# Patient Record
Sex: Male | Born: 1945 | Race: White | Hispanic: No | Smoking: Former smoker
Health system: Southern US, Community
[De-identification: ages and names within clinical notes are randomized; demographics above are authoritative.]

## PROBLEM LIST (undated history)

## (undated) DIAGNOSIS — K219 Gastro-esophageal reflux disease without esophagitis: Secondary | ICD-10-CM

## (undated) DIAGNOSIS — E119 Type 2 diabetes mellitus without complications: Secondary | ICD-10-CM

## (undated) DIAGNOSIS — K7581 Nonalcoholic steatohepatitis (NASH): Secondary | ICD-10-CM

## (undated) DIAGNOSIS — E039 Hypothyroidism, unspecified: Secondary | ICD-10-CM

## (undated) DIAGNOSIS — E785 Hyperlipidemia, unspecified: Secondary | ICD-10-CM

## (undated) DIAGNOSIS — I10 Essential (primary) hypertension: Secondary | ICD-10-CM

## (undated) HISTORY — PX: HERNIA REPAIR: SHX51

## (undated) HISTORY — PX: APPENDECTOMY: SHX54

---

## 2021-04-01 ENCOUNTER — Emergency Department: Payer: Medicare Other

## 2021-04-01 ENCOUNTER — Encounter: Payer: Self-pay | Admitting: Internal Medicine

## 2021-04-01 ENCOUNTER — Inpatient Hospital Stay
Admission: EM | Admit: 2021-04-01 | Discharge: 2021-04-10 | DRG: 178 | Disposition: A | Discharge status: Home Health | Payer: Medicare Other | Attending: Internal Medicine | Admitting: Internal Medicine

## 2021-04-01 ENCOUNTER — Other Ambulatory Visit: Payer: Self-pay

## 2021-04-01 ENCOUNTER — Inpatient Hospital Stay: Payer: Medicare Other

## 2021-04-01 DIAGNOSIS — Z833 Family history of diabetes mellitus: Secondary | ICD-10-CM | POA: Diagnosis not present

## 2021-04-01 DIAGNOSIS — Z87891 Personal history of nicotine dependence: Secondary | ICD-10-CM | POA: Diagnosis not present

## 2021-04-01 DIAGNOSIS — K7460 Unspecified cirrhosis of liver: Secondary | ICD-10-CM | POA: Diagnosis not present

## 2021-04-01 DIAGNOSIS — I10 Essential (primary) hypertension: Secondary | ICD-10-CM | POA: Insufficient documentation

## 2021-04-01 DIAGNOSIS — I959 Hypotension, unspecified: Secondary | ICD-10-CM | POA: Diagnosis not present

## 2021-04-01 DIAGNOSIS — R531 Weakness: Secondary | ICD-10-CM

## 2021-04-01 DIAGNOSIS — N179 Acute kidney failure, unspecified: Secondary | ICD-10-CM | POA: Diagnosis present

## 2021-04-01 DIAGNOSIS — M25562 Pain in left knee: Secondary | ICD-10-CM

## 2021-04-01 DIAGNOSIS — E871 Hypo-osmolality and hyponatremia: Secondary | ICD-10-CM | POA: Diagnosis present

## 2021-04-01 DIAGNOSIS — T796XXD Traumatic ischemia of muscle, subsequent encounter: Secondary | ICD-10-CM | POA: Diagnosis not present

## 2021-04-01 DIAGNOSIS — D696 Thrombocytopenia, unspecified: Secondary | ICD-10-CM

## 2021-04-01 DIAGNOSIS — G9349 Other encephalopathy: Secondary | ICD-10-CM | POA: Diagnosis present

## 2021-04-01 DIAGNOSIS — W010XXA Fall on same level from slipping, tripping and stumbling without subsequent striking against object, initial encounter: Secondary | ICD-10-CM | POA: Diagnosis not present

## 2021-04-01 DIAGNOSIS — Y92009 Unspecified place in unspecified non-institutional (private) residence as the place of occurrence of the external cause: Secondary | ICD-10-CM

## 2021-04-01 DIAGNOSIS — S0083XA Contusion of other part of head, initial encounter: Secondary | ICD-10-CM | POA: Diagnosis present

## 2021-04-01 DIAGNOSIS — Z7984 Long term (current) use of oral hypoglycemic drugs: Secondary | ICD-10-CM

## 2021-04-01 DIAGNOSIS — Y92238 Other place in hospital as the place of occurrence of the external cause: Secondary | ICD-10-CM | POA: Diagnosis present

## 2021-04-01 DIAGNOSIS — Z8249 Family history of ischemic heart disease and other diseases of the circulatory system: Secondary | ICD-10-CM | POA: Diagnosis not present

## 2021-04-01 DIAGNOSIS — Z79899 Other long term (current) drug therapy: Secondary | ICD-10-CM | POA: Diagnosis not present

## 2021-04-01 DIAGNOSIS — K7581 Nonalcoholic steatohepatitis (NASH): Secondary | ICD-10-CM | POA: Insufficient documentation

## 2021-04-01 DIAGNOSIS — W19XXXA Unspecified fall, initial encounter: Secondary | ICD-10-CM | POA: Diagnosis not present

## 2021-04-01 DIAGNOSIS — E039 Hypothyroidism, unspecified: Secondary | ICD-10-CM | POA: Insufficient documentation

## 2021-04-01 DIAGNOSIS — D6959 Other secondary thrombocytopenia: Secondary | ICD-10-CM | POA: Diagnosis present

## 2021-04-01 DIAGNOSIS — S0001XA Abrasion of scalp, initial encounter: Secondary | ICD-10-CM | POA: Diagnosis present

## 2021-04-01 DIAGNOSIS — T796XXS Traumatic ischemia of muscle, sequela: Secondary | ICD-10-CM | POA: Diagnosis not present

## 2021-04-01 DIAGNOSIS — E785 Hyperlipidemia, unspecified: Secondary | ICD-10-CM | POA: Diagnosis not present

## 2021-04-01 DIAGNOSIS — K219 Gastro-esophageal reflux disease without esophagitis: Secondary | ICD-10-CM | POA: Diagnosis present

## 2021-04-01 DIAGNOSIS — R4182 Altered mental status, unspecified: Secondary | ICD-10-CM

## 2021-04-01 DIAGNOSIS — D649 Anemia, unspecified: Secondary | ICD-10-CM | POA: Diagnosis present

## 2021-04-01 DIAGNOSIS — W19XXXS Unspecified fall, sequela: Secondary | ICD-10-CM | POA: Diagnosis not present

## 2021-04-01 DIAGNOSIS — E1169 Type 2 diabetes mellitus with other specified complication: Secondary | ICD-10-CM | POA: Diagnosis present

## 2021-04-01 DIAGNOSIS — T796XXA Traumatic ischemia of muscle, initial encounter: Secondary | ICD-10-CM | POA: Diagnosis not present

## 2021-04-01 DIAGNOSIS — N1831 Chronic kidney disease, stage 3a: Secondary | ICD-10-CM | POA: Diagnosis present

## 2021-04-01 DIAGNOSIS — M6282 Rhabdomyolysis: Secondary | ICD-10-CM | POA: Diagnosis present

## 2021-04-01 DIAGNOSIS — E1122 Type 2 diabetes mellitus with diabetic chronic kidney disease: Secondary | ICD-10-CM | POA: Diagnosis present

## 2021-04-01 DIAGNOSIS — U071 COVID-19: Principal | ICD-10-CM | POA: Diagnosis present

## 2021-04-01 DIAGNOSIS — Z794 Long term (current) use of insulin: Secondary | ICD-10-CM | POA: Diagnosis not present

## 2021-04-01 DIAGNOSIS — E876 Hypokalemia: Secondary | ICD-10-CM | POA: Diagnosis not present

## 2021-04-01 DIAGNOSIS — I129 Hypertensive chronic kidney disease with stage 1 through stage 4 chronic kidney disease, or unspecified chronic kidney disease: Secondary | ICD-10-CM | POA: Diagnosis present

## 2021-04-01 DIAGNOSIS — Z23 Encounter for immunization: Secondary | ICD-10-CM

## 2021-04-01 DIAGNOSIS — R52 Pain, unspecified: Secondary | ICD-10-CM

## 2021-04-01 DIAGNOSIS — Z79890 Hormone replacement therapy (postmenopausal): Secondary | ICD-10-CM

## 2021-04-01 HISTORY — DX: Essential (primary) hypertension: I10

## 2021-04-01 HISTORY — DX: Nonalcoholic steatohepatitis (NASH): K75.81

## 2021-04-01 HISTORY — DX: Type 2 diabetes mellitus without complications: E11.9

## 2021-04-01 HISTORY — DX: Hypothyroidism, unspecified: E03.9

## 2021-04-01 HISTORY — DX: Hyperlipidemia, unspecified: E78.5

## 2021-04-01 HISTORY — DX: Gastro-esophageal reflux disease without esophagitis: K21.9

## 2021-04-01 LAB — HEPATIC FUNCTION PANEL
ALT: 38 U/L (ref 0–44)
AST: 93 U/L — ABNORMAL HIGH (ref 15–41)
Albumin: 3 g/dL — ABNORMAL LOW (ref 3.5–5.0)
Alkaline Phosphatase: 69 U/L (ref 38–126)
Bilirubin, Direct: 0.3 mg/dL — ABNORMAL HIGH (ref 0.0–0.2)
Indirect Bilirubin: 1.5 mg/dL — ABNORMAL HIGH (ref 0.3–0.9)
Total Bilirubin: 1.8 mg/dL — ABNORMAL HIGH (ref 0.3–1.2)
Total Protein: 6.8 g/dL (ref 6.5–8.1)

## 2021-04-01 LAB — RESP PANEL BY RT-PCR (FLU A&B, COVID) ARPGX2
Influenza A by PCR: NEGATIVE
Influenza B by PCR: NEGATIVE
SARS Coronavirus 2 by RT PCR: POSITIVE — AB

## 2021-04-01 LAB — CK: Total CK: 3206 U/L — ABNORMAL HIGH (ref 49–397)

## 2021-04-01 LAB — ETHANOL: Alcohol, Ethyl (B): <10 mg/dL (ref <10)

## 2021-04-01 LAB — CBC
HCT: 35.3 % — ABNORMAL LOW (ref 39.0–52.0)
Hemoglobin: 12.1 g/dL — ABNORMAL LOW (ref 13.0–17.0)
MCH: 30.9 pg (ref 26.0–34.0)
MCHC: 34.3 g/dL (ref 30.0–36.0)
MCV: 90.1 fL (ref 80.0–100.0)
Platelets: 90 10*3/uL — ABNORMAL LOW (ref 150–400)
RBC: 3.92 MIL/uL — ABNORMAL LOW (ref 4.22–5.81)
RDW: 14.9 % (ref 11.5–15.5)
WBC: 7.5 10*3/uL (ref 4.0–10.5)
nRBC: 0 % (ref 0.0–0.2)

## 2021-04-01 LAB — LIPASE, BLOOD: Lipase: 29 U/L (ref 11–51)

## 2021-04-01 LAB — BASIC METABOLIC PANEL
Anion gap: 9 (ref 5–15)
BUN: 36 mg/dL — ABNORMAL HIGH (ref 8–23)
CO2: 20 mmol/L — ABNORMAL LOW (ref 22–32)
Calcium: 8.7 mg/dL — ABNORMAL LOW (ref 8.9–10.3)
Chloride: 104 mmol/L (ref 98–111)
Creatinine, Ser: 1.35 mg/dL — ABNORMAL HIGH (ref 0.61–1.24)
GFR, Estimated: 54 mL/min — ABNORMAL LOW (ref >60)
Glucose, Bld: 210 mg/dL — ABNORMAL HIGH (ref 70–99)
Potassium: 3.5 mmol/L (ref 3.5–5.1)
Sodium: 133 mmol/L — ABNORMAL LOW (ref 135–145)

## 2021-04-01 IMAGING — CT CT MAXILLOFACIAL W/O CM
3 series · 15 of 47 positions shown, 18 images · non-contrast
Comparison: None.

CLINICAL DATA: Trauma

EXAM:
CT MAXILLOFACIAL WITHOUT CONTRAST
TECHNIQUE: Multidetector CT imaging of the maxillofacial structures was
performed. Multiplanar CT image reconstructions were also generated.

[Series 2: max soft · axial · 0.37mm/px · z∈[-252,-104]mm · 9 of 86 slices shown, 12 images]
[im 6/86  brain]
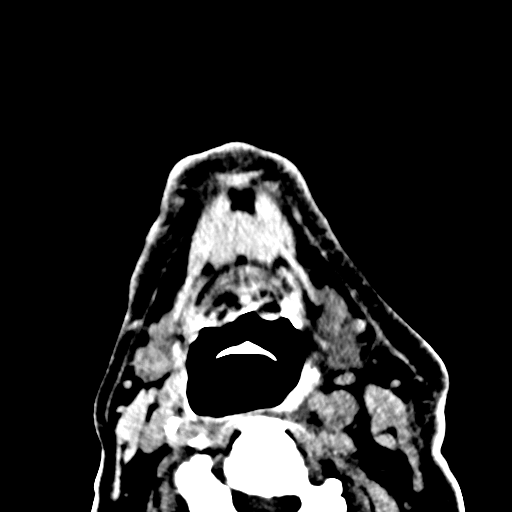
[im 6/86  bone]
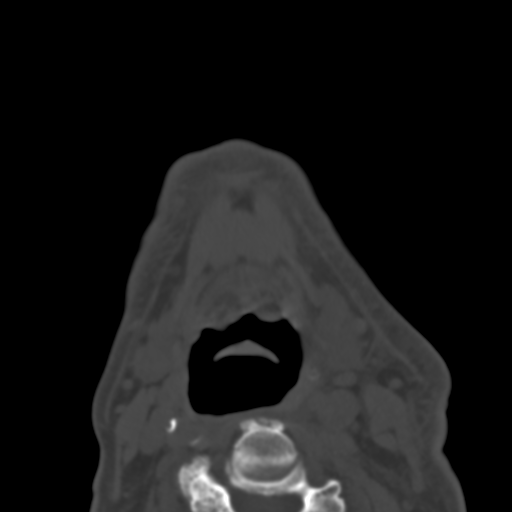
[im 15/86  bone]
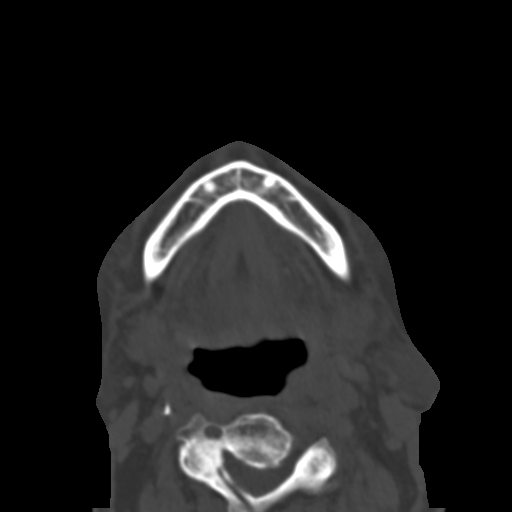
[im 24/86  bone]
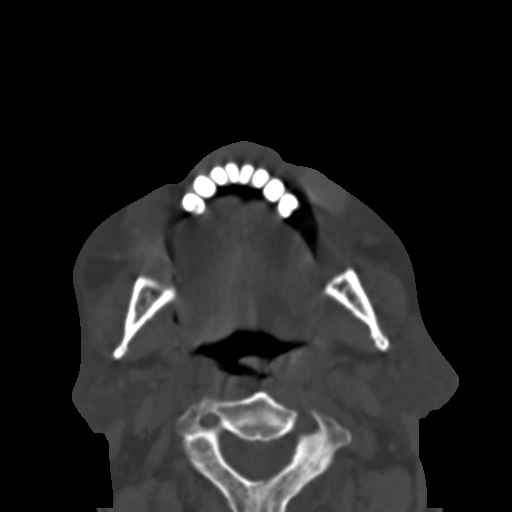
[im 33/86  bone]
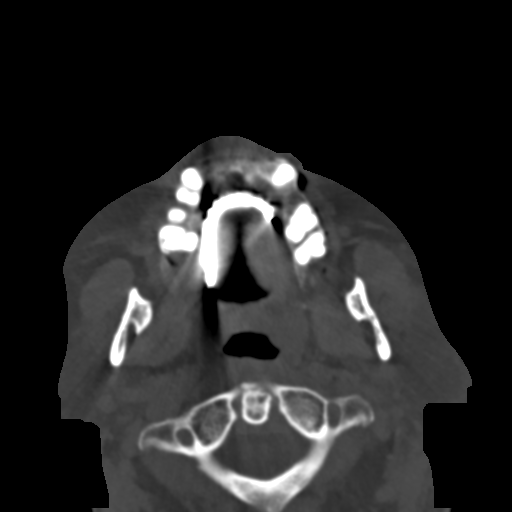
[im 44/86  brain]
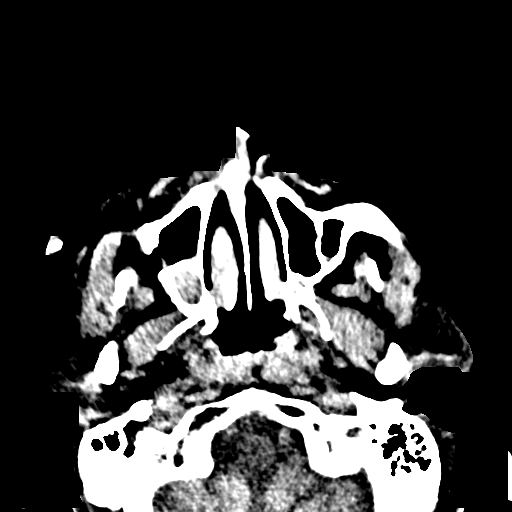
[im 44/86  bone]
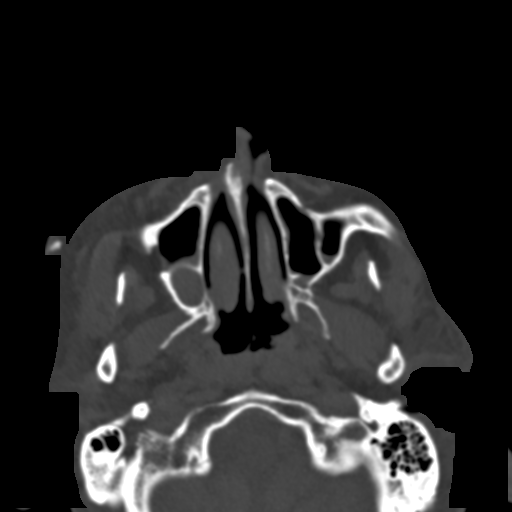
[im 53/86  bone]
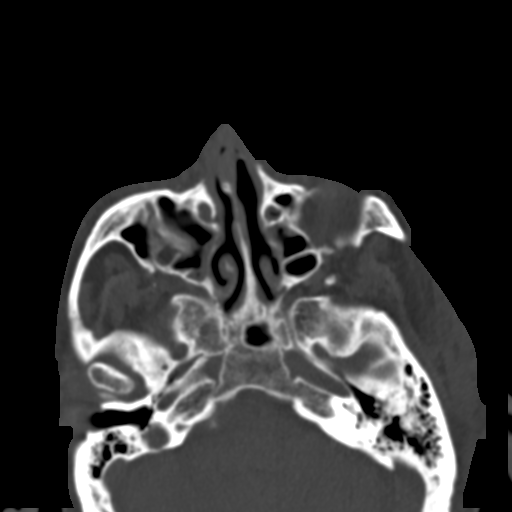
[im 62/86  bone]
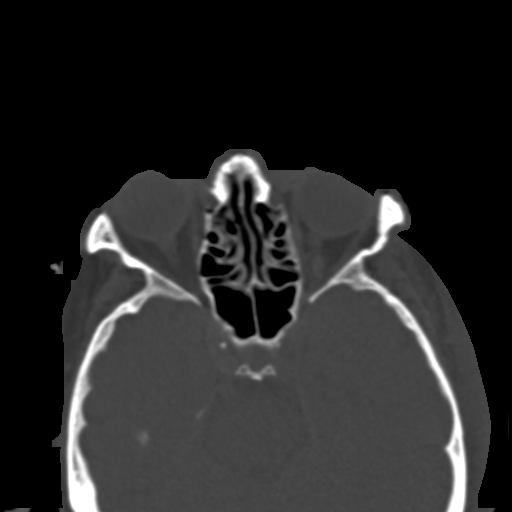
[im 71/86  bone]
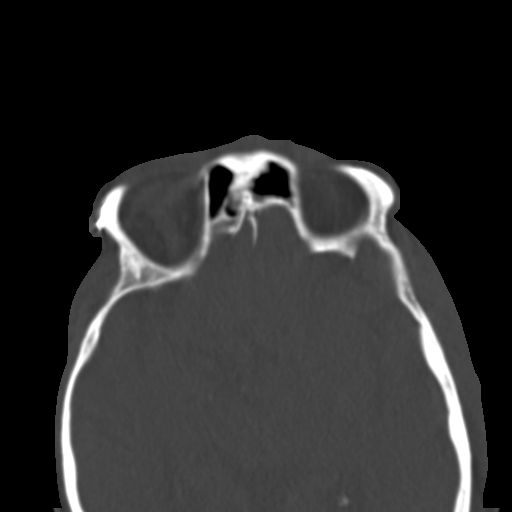
[im 80/86  brain]
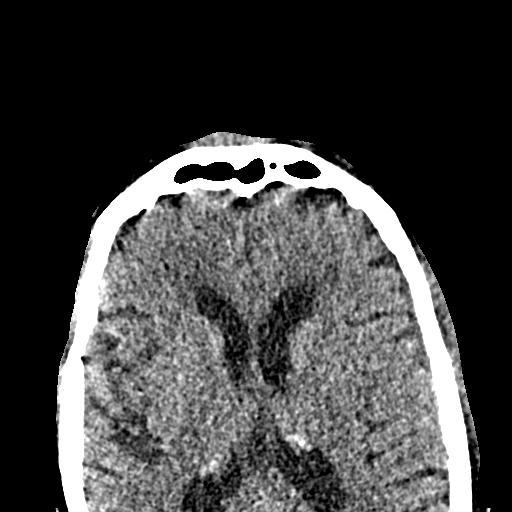
[im 80/86  bone]
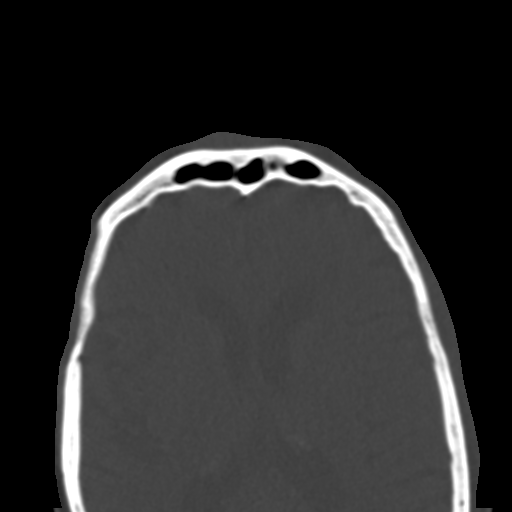

[Series 6: coronal soft · coronal · 0.36mm/px · 3 of 114 slices shown]
[im 38/114  bone]
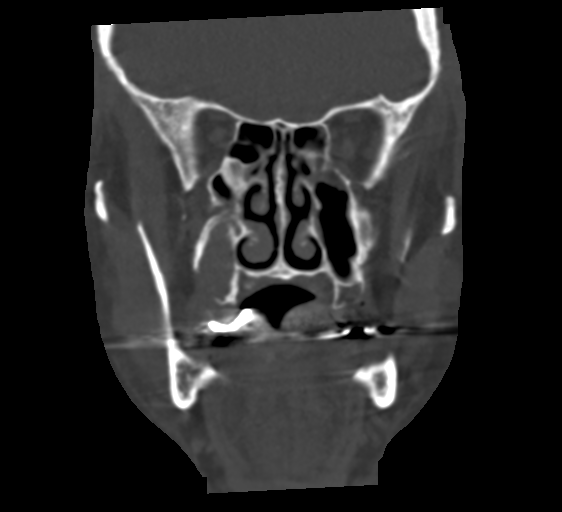
[im 51/114  bone]
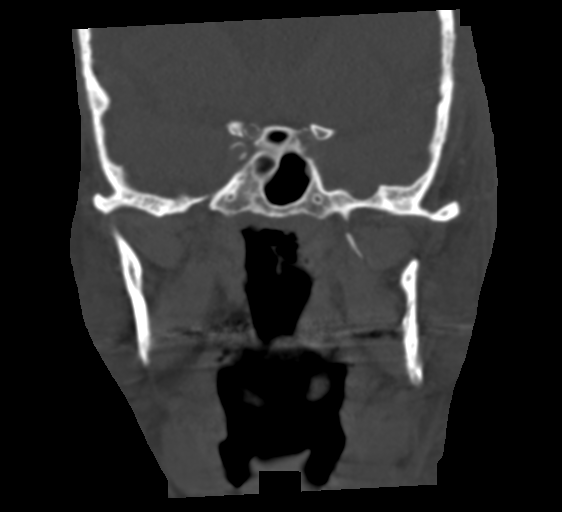
[im 63/114  bone]
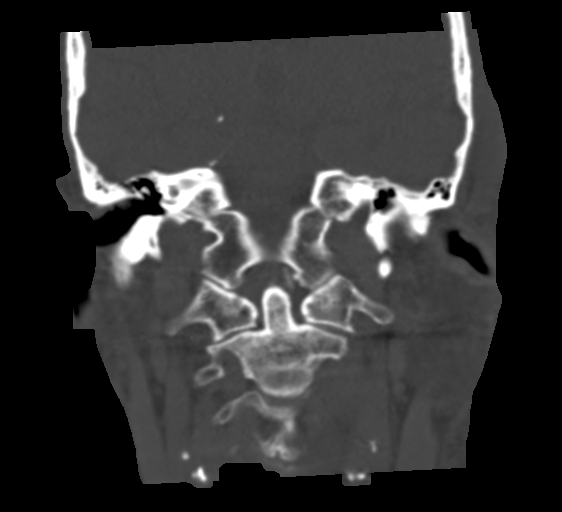

[Series 7: sagittal soft · sagittal · 0.36mm/px · 3 of 102 slices shown]
[im 34/102  bone]
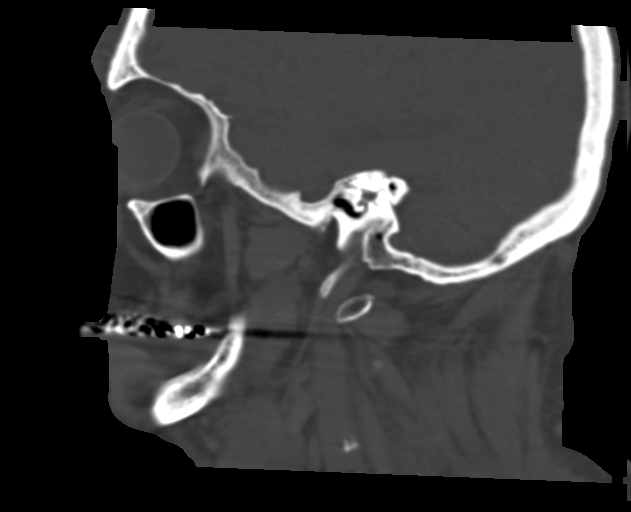
[im 51/102  bone]
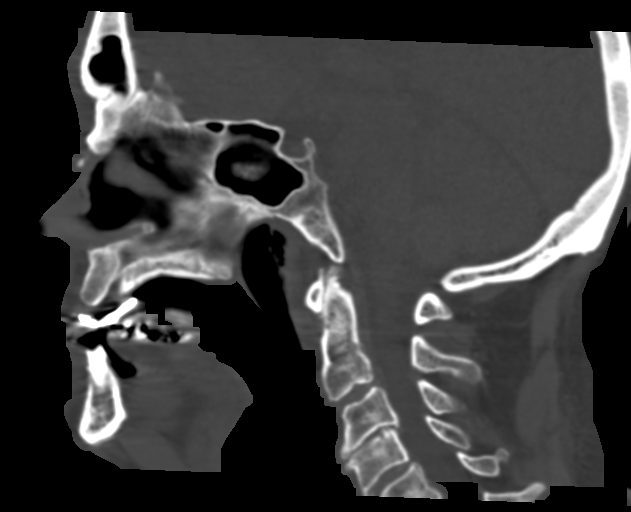
[im 68/102  bone]
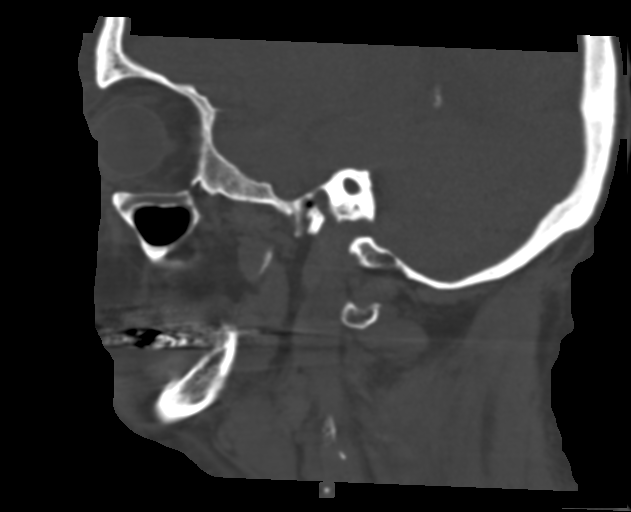

[15 of 47 positions shown; findings below may reference images not displayed]

FINDINGS: Osseous: There is possible old blowout fracture in the medial wall
of left orbit. There is deformity in the the lateral walls of both
orbits along with metallic densities suggesting previous internal
fixation. There is possible old blowout fractures along the floors
of both orbits, more so on the left side. There is deformity in the
posterolateral wall of right maxillary sinus, possibly residual from
previous injury. Possibility of re-injury at the site of old
fractures is not excluded.

Orbits: Optic globes are symmetrical. Retrobulbar soft tissues are
unremarkable.

Sinuses: There is mild mucosal thickening in the ethmoid sinus.
There are no air-fluid levels in the paranasal sinuses. There is
mucosal thickening in both maxillary sinuses, more so along the
posterior right maxillary sinus.

Soft tissues: There is soft tissue swelling in the subcutaneous
plane in the frontal scalp and periorbital regions, more so on the
right side.

Limited intracranial: Unremarkable.
IMPRESSION: There is deformity in the lateral walls of both orbits medial wall
of left orbit and along the floors of both orbits. There is
deformity in the both maxillary sinuses. Possible postsurgical
changes are noted in the lateral walls of both orbits. Findings most
likely suggest residual deformities from old fractures. Please
correlate with clinical history. Possibility of re-injury at the
site of old fractures is not excluded.

No focal abnormality is seen in the optic globes and retrobulbar
soft tissues. There are no air-fluid levels in the paranasal
sinuses.

## 2021-04-01 IMAGING — CR DG CHEST 2V
1 series · 3 of 3 positions shown · non-contrast
Comparison: Film from earlier in the same day.

CLINICAL DATA: Recent falls with chest pain, initial encounter

EXAM:
CHEST - 2 VIEW

[Series 1: dg chest 2 view · 0.14mm/px · 3 of 3 slices shown]
[im 1/3]
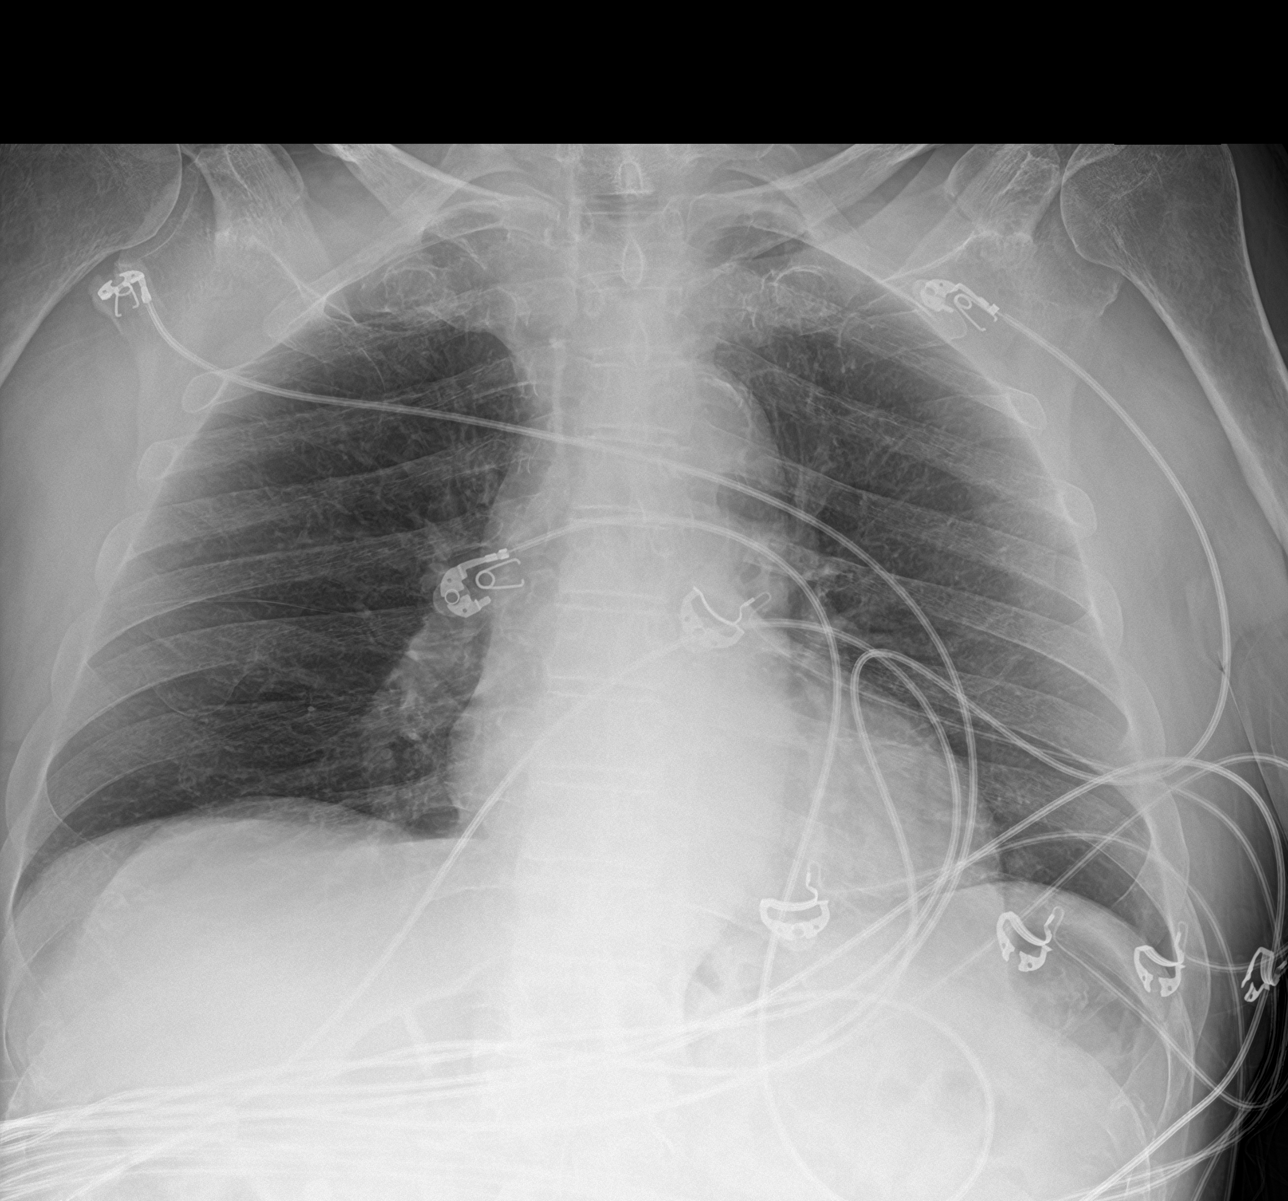
[im 2/3]
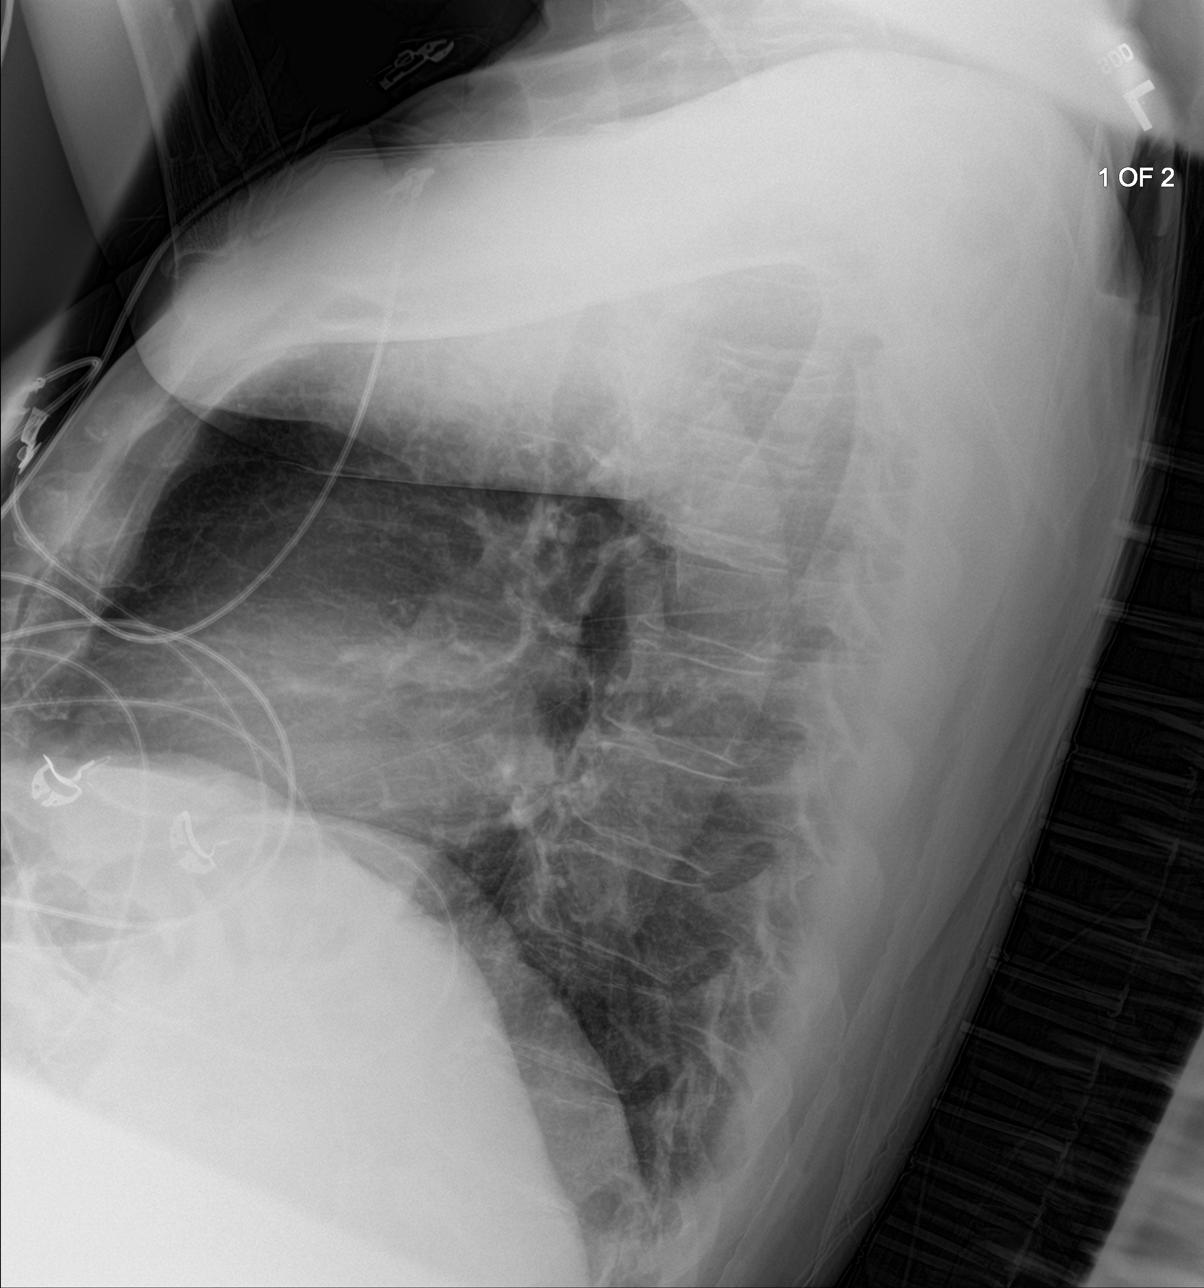
[im 3/3]
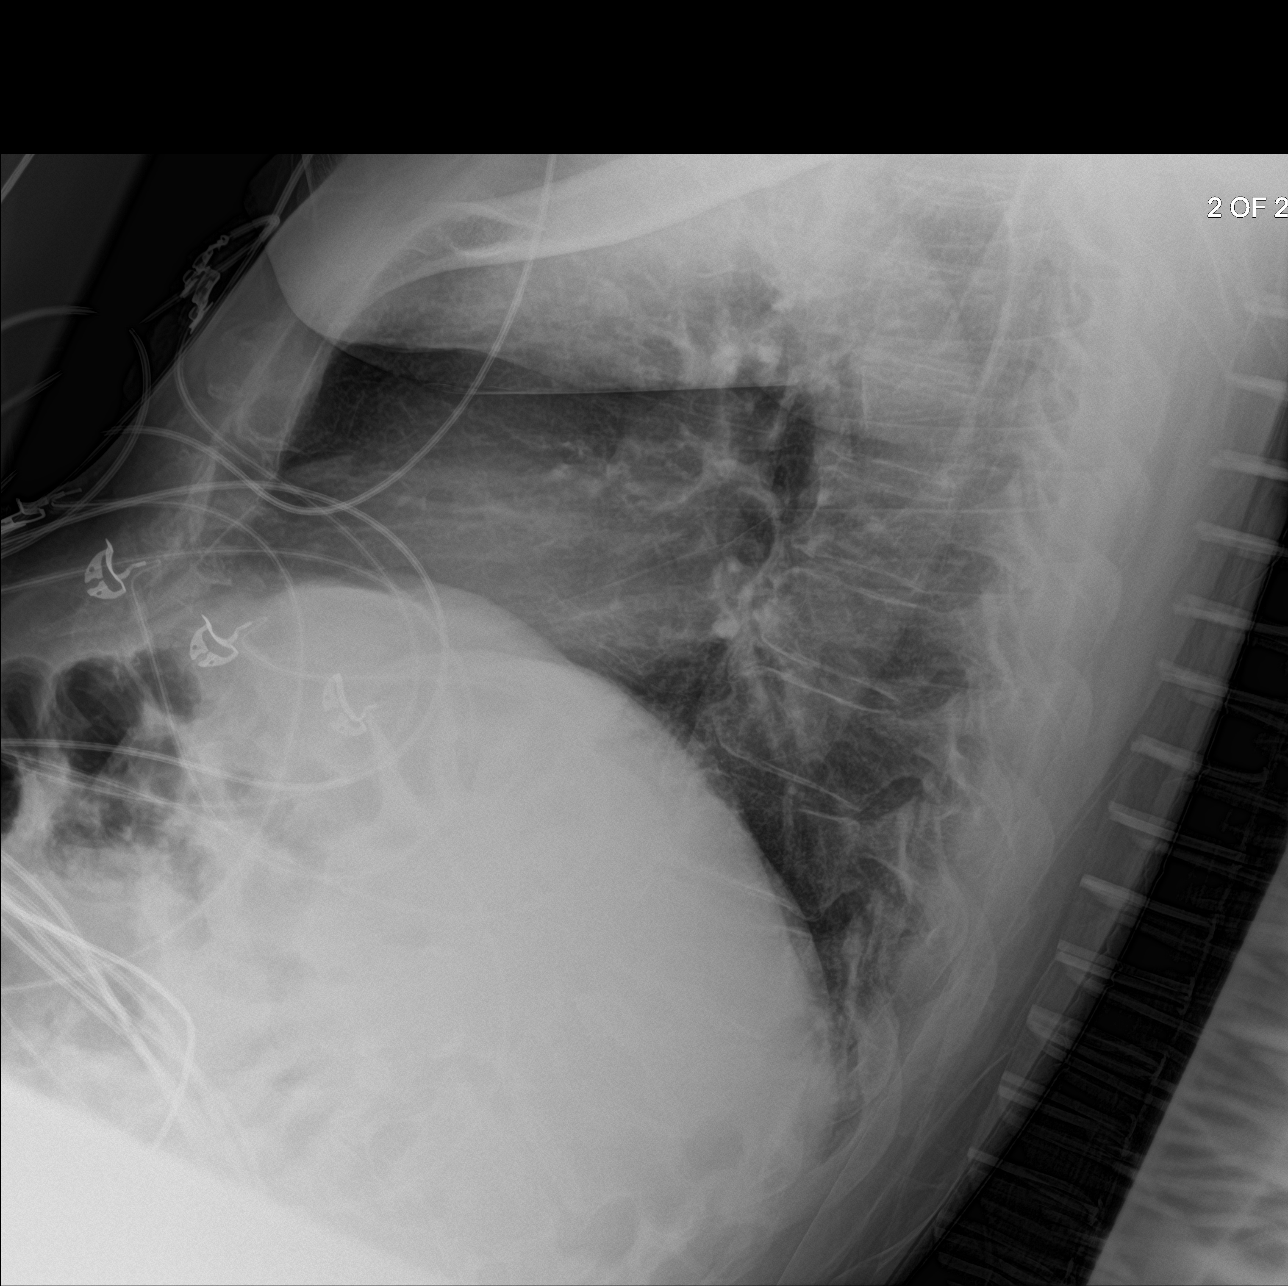

[3 of 3 positions shown; findings below may reference images not displayed]

FINDINGS: Cardiac shadow is within normal limits. Aortic calcifications are
noted. Lungs are clear bilaterally. Bony abnormality is seen.
IMPRESSION: No active cardiopulmonary disease.

## 2021-04-01 IMAGING — CT CT HEAD W/O CM
4 series · 16 of 47 positions shown, 18 images · non-contrast
Comparison: CT from earlier in the same day.

CLINICAL DATA: Recurrent falls today

EXAM:
CT HEAD WITHOUT CONTRAST
CT CERVICAL SPINE WITHOUT CONTRAST
TECHNIQUE: Multidetector CT imaging of the head and cervical spine was
performed following the standard protocol without intravenous
contrast. Multiplanar CT image reconstructions of the cervical spine
were also generated.

[Series 2: head wo · axial · 0.48mm/px · z∈[+410,+550]mm · 7 of 38 slices shown, 9 images]
[im 5/38  brain]
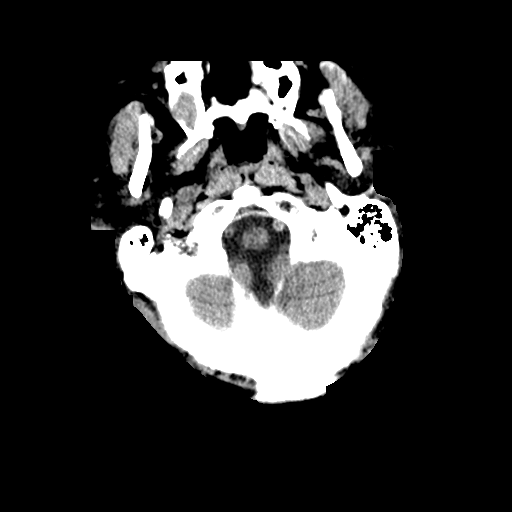
[im 5/38  bone]
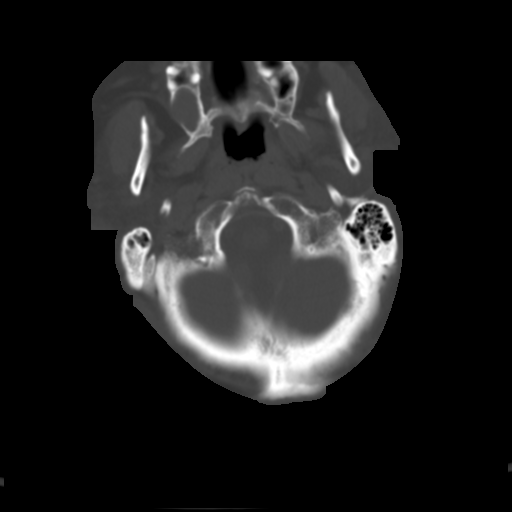
[im 10/38  brain]
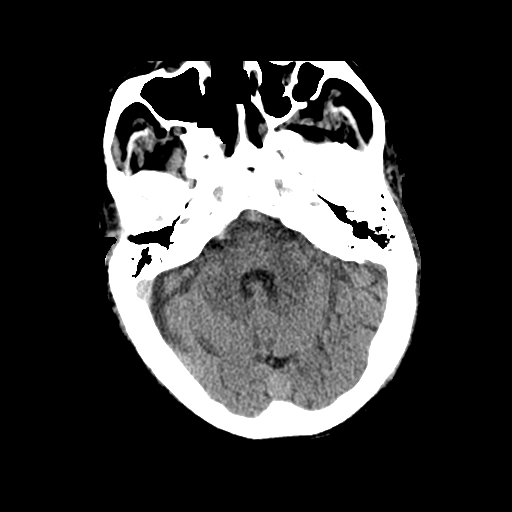
[im 14/38  brain]
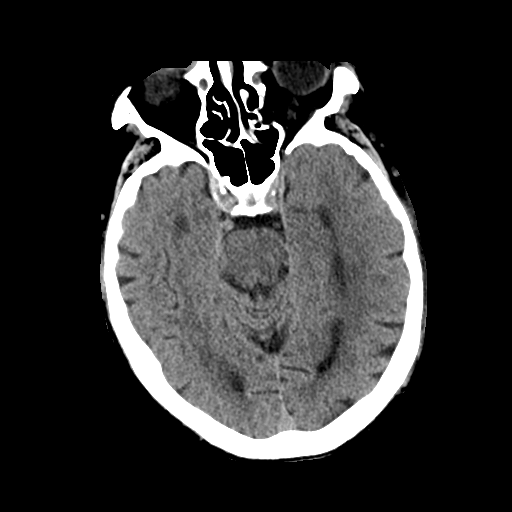
[im 19/38  brain]
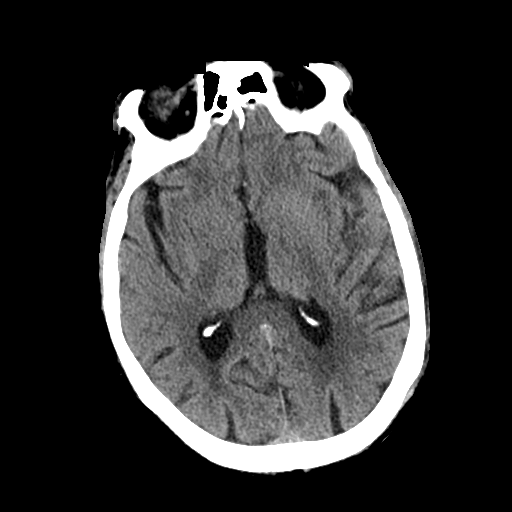
[im 24/38  brain]
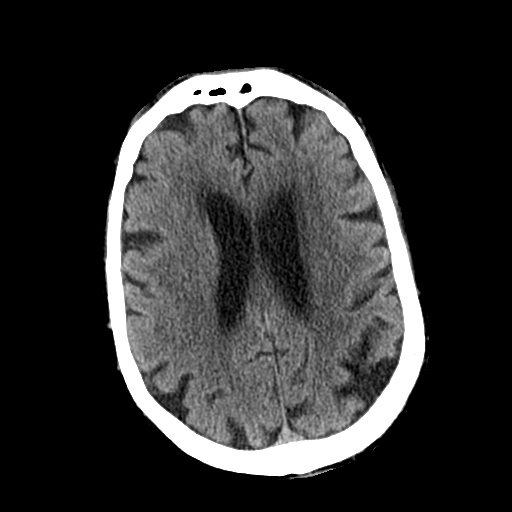
[im 24/38  bone]
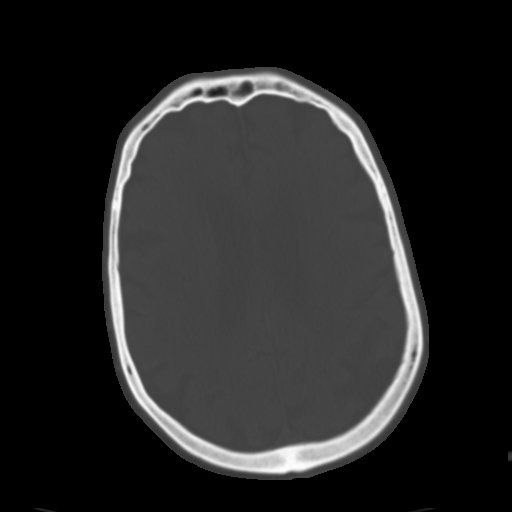
[im 28/38  brain]
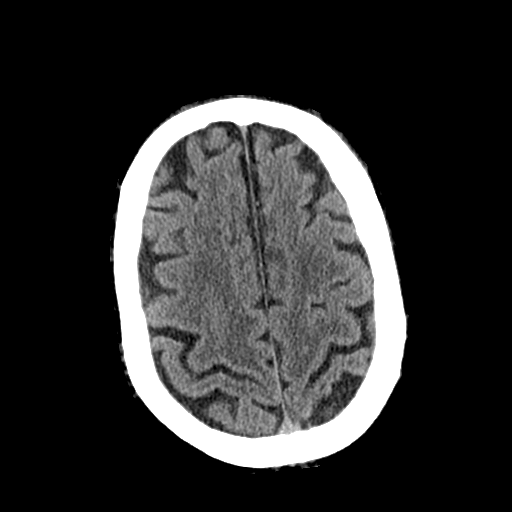
[im 33/38  brain]
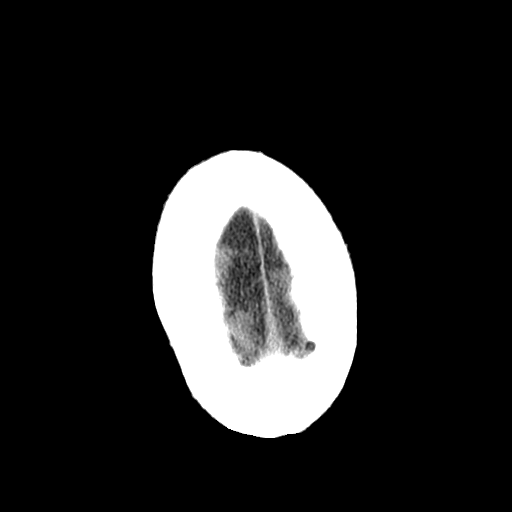

[Series 3: head bone · axial · 0.48mm/px · z∈[+408,+446]mm · 3 of 95 slices shown]
[im 10/95  bone]
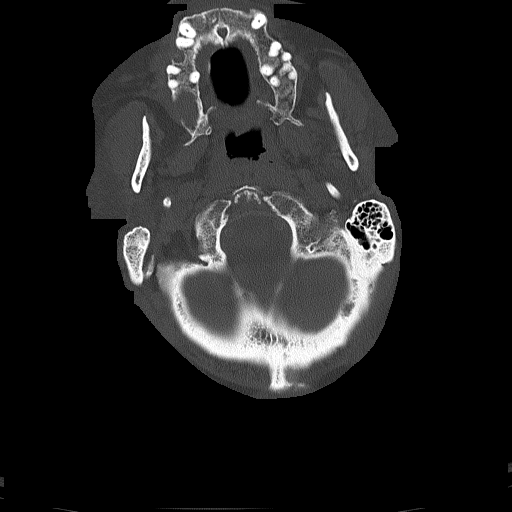
[im 19/95  bone]
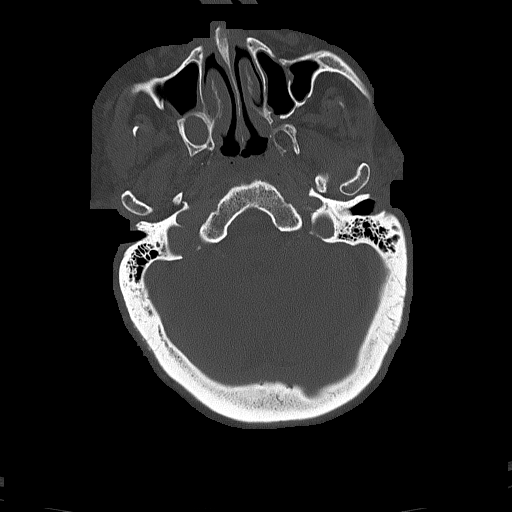
[im 29/95  bone]
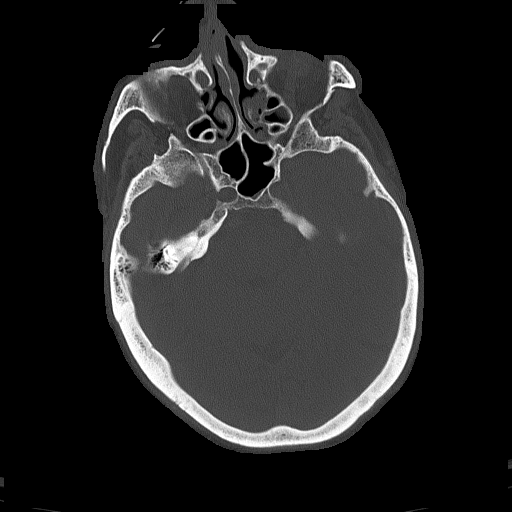

[Series 4: coronal soft tissue · coronal · 0.38mm/px · 3 of 79 slices shown]
[im 31/79  brain]
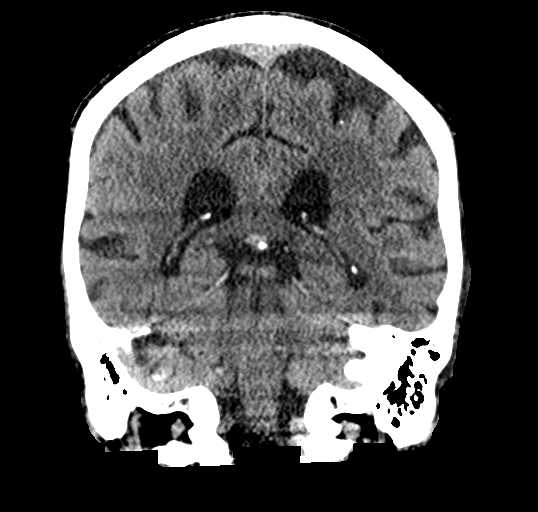
[im 37/79  brain]
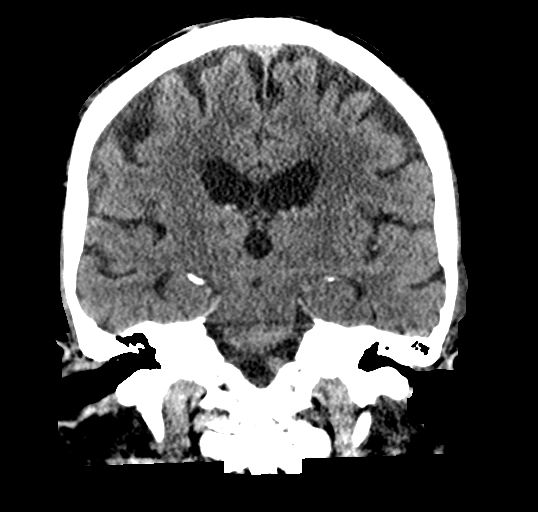
[im 43/79  brain]
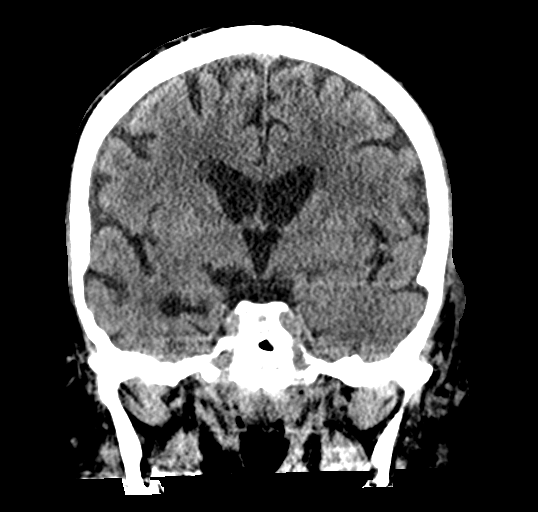

[Series 5: sagittal soft tissue · sagittal · 0.40mm/px · 3 of 69 slices shown]
[im 23/69  brain]
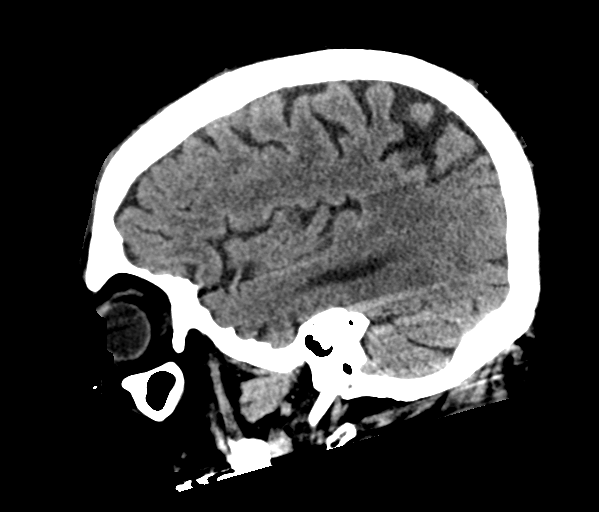
[im 35/69  brain]
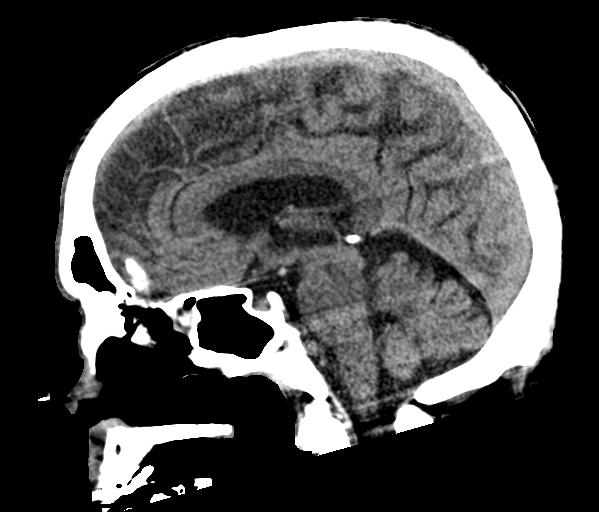
[im 46/69  brain]
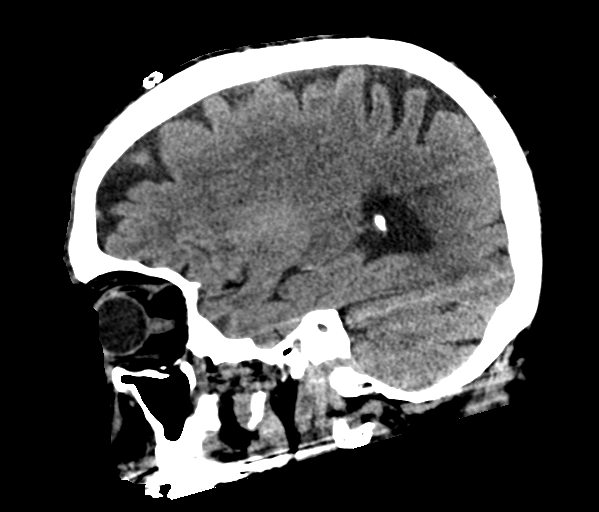

[16 of 47 positions shown; findings below may reference images not displayed]

FINDINGS: CT HEAD FINDINGS

Brain: No evidence of acute infarction, hemorrhage, hydrocephalus,
extra-axial collection or mass lesion/mass effect. Chronic atrophic
and ischemic changes are noted.

Vascular: No hyperdense vessel or unexpected calcification.

Skull: Normal. Negative for fracture or focal lesion.

Sinuses/Orbits: Postsurgical changes are noted along the lateral
aspect of the orbits bilaterally.

Other: Surgical staples are noted in the right forehead consistent
with the recent fall and laceration.

CT CERVICAL SPINE FINDINGS

Alignment: Stable retrolisthesis of C3 on C4 is noted.

Skull base and vertebrae: 7 cervical segments are well visualized.
Vertebral body height is well maintained. Multilevel osteophytic
changes and disc space narrowing is seen. Facet hypertrophic changes
are noted. No acute fracture or acute facet abnormality is noted.

Soft tissues and spinal canal: Atherosclerotic calcifications of the
carotid arteries are seen. No acute soft tissue abnormality is
noted.

Upper chest: Visualized lung apices are within normal limits.

Other: None
IMPRESSION: CT of the head: Right scalp laceration with surgical staples in
place.

Chronic atrophic and ischemic changes.

No acute intracranial abnormality is noted.

CT of the cervical spine: Multilevel degenerative changes similar to
that seen on the exam from earlier in the same day. No acute
fracture is noted.

## 2021-04-01 IMAGING — CT CT CERVICAL SPINE W/O CM
3 of 4 series · 12 of 33 positions shown, 14 images · non-contrast
Comparison: CT from earlier in the same day.

CLINICAL DATA: Recurrent falls today

EXAM:
CT HEAD WITHOUT CONTRAST
CT CERVICAL SPINE WITHOUT CONTRAST
TECHNIQUE: Multidetector CT imaging of the head and cervical spine was
performed following the standard protocol without intravenous
contrast. Multiplanar CT image reconstructions of the cervical spine
were also generated.

[Series 6: orthogonal bone · axial · 0.27mm/px · z∈[+209,+346]mm · 4 of 109 slices shown, 5 images]
[im 19/109  soft-tissue]
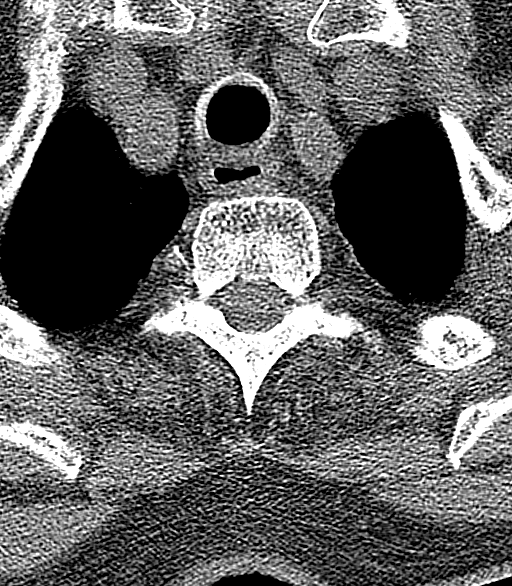
[im 19/109  bone]
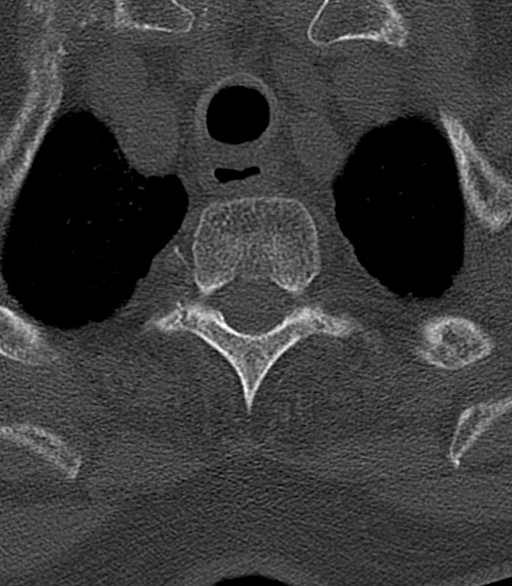
[im 37/109  bone]
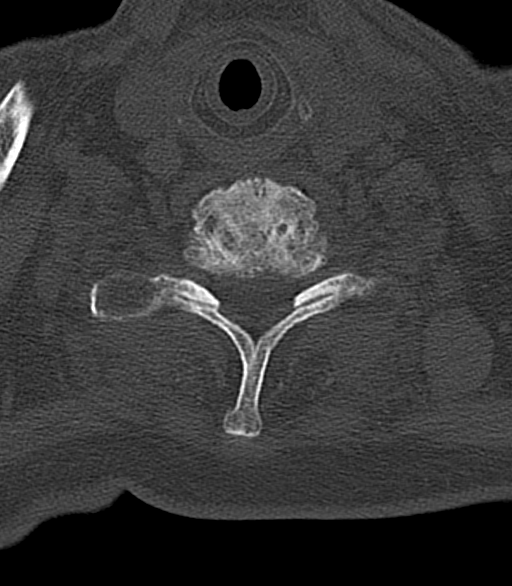
[im 73/109  bone]
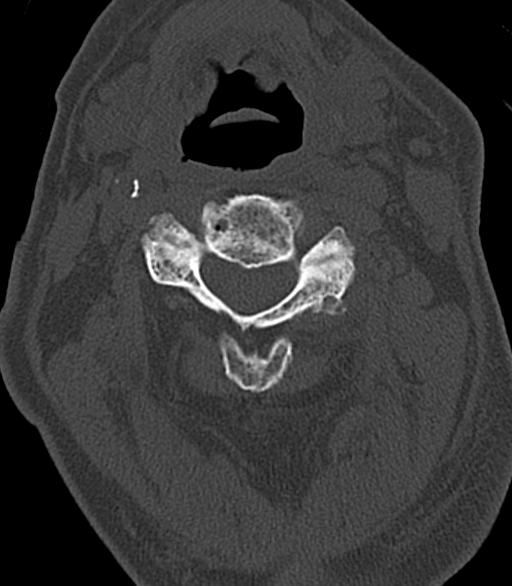
[im 91/109  bone]
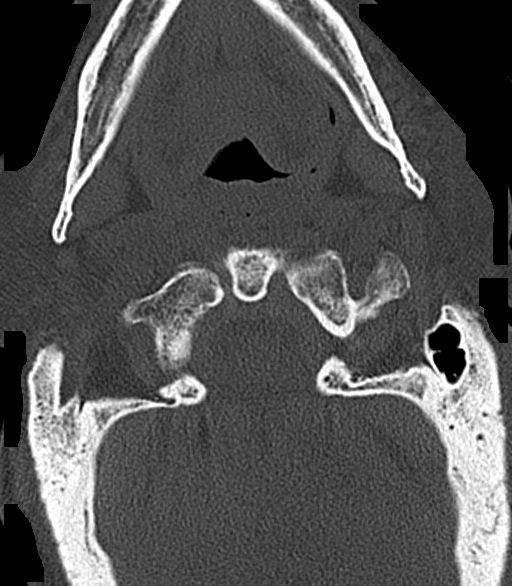

[Series 7: sagittal bone · sagittal · 0.32mm/px · 5 of 83 slices shown, 6 images]
[im 28/83  bone]
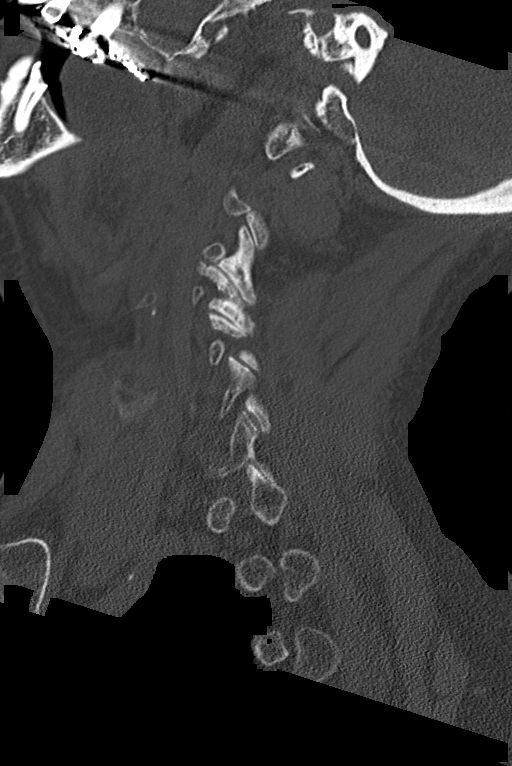
[im 35/83  bone]
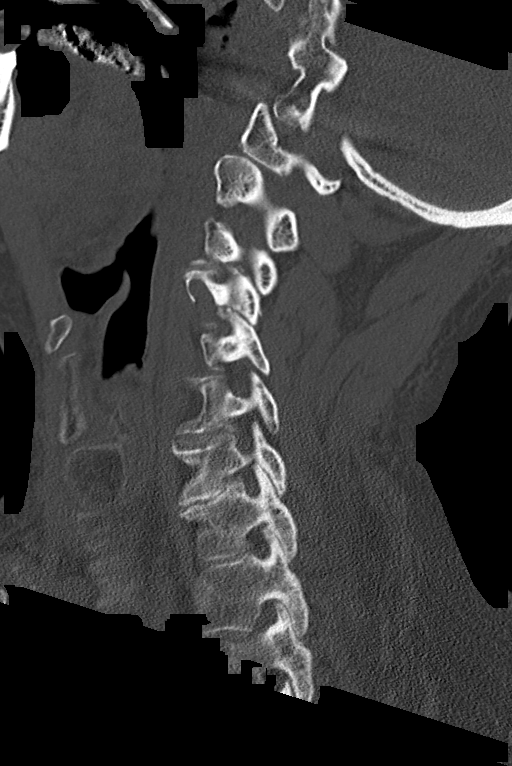
[im 42/83  soft-tissue]
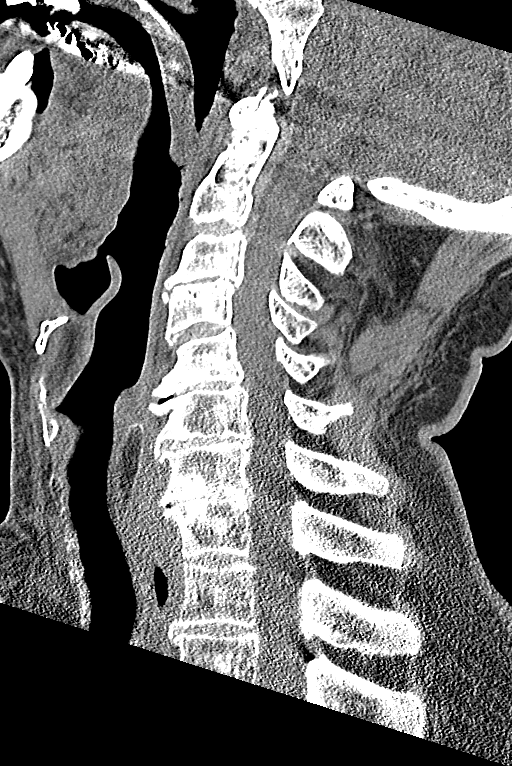
[im 42/83  bone]
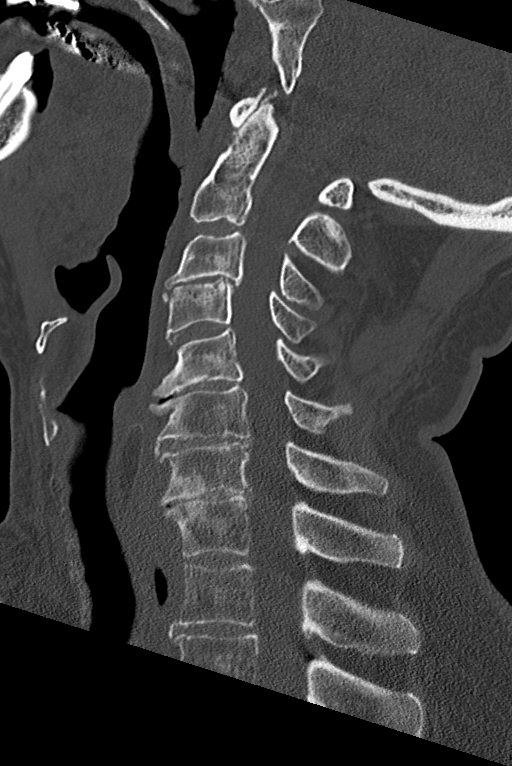
[im 48/83  bone]
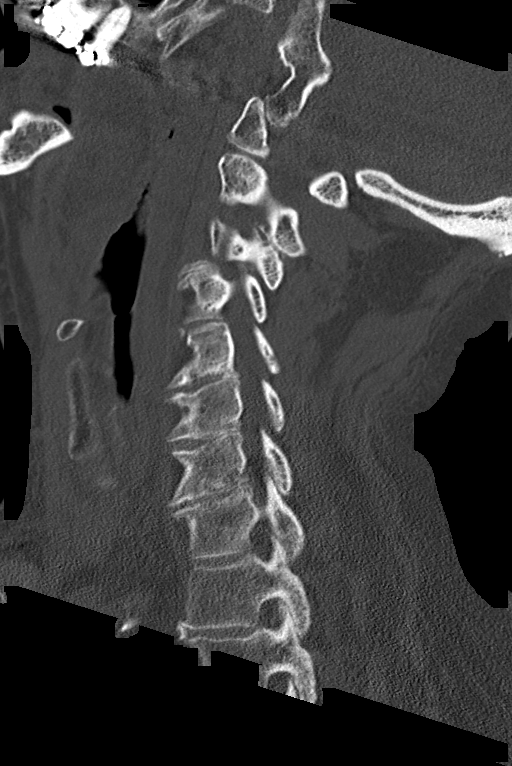
[im 55/83  bone]
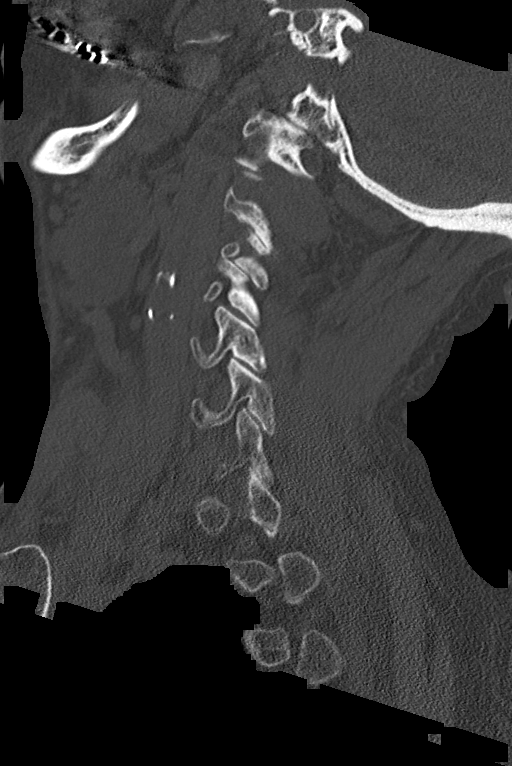

[Series 8: coronal bone · coronal · 0.32mm/px · 3 of 75 slices shown]
[im 17/75  bone]
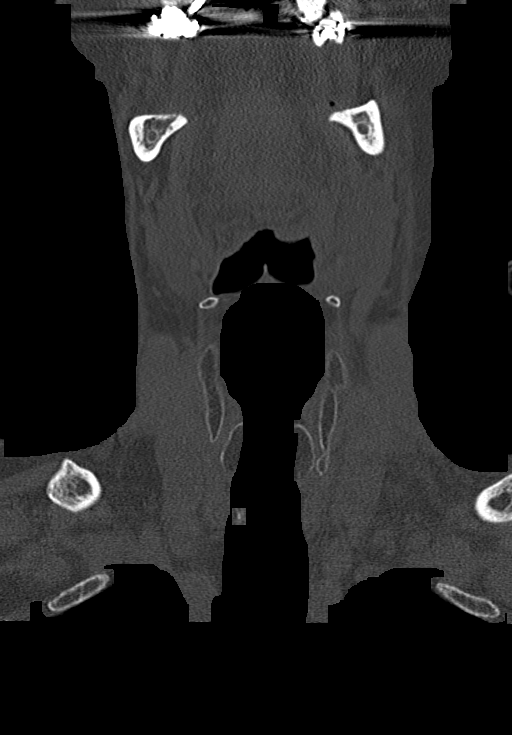
[im 31/75  bone]
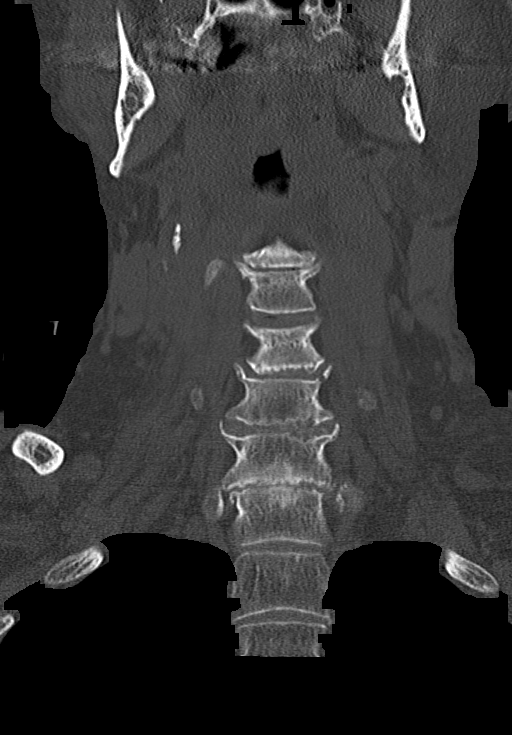
[im 44/75  bone]
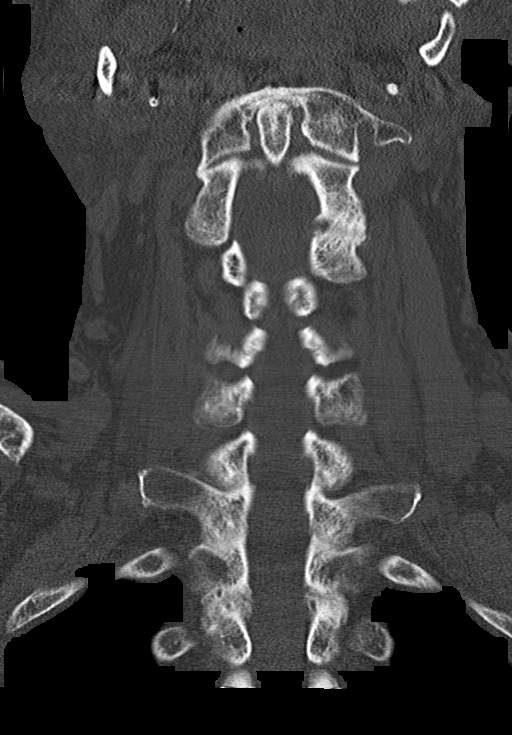

[12 of 33 positions shown; findings below may reference images not displayed]

FINDINGS: CT HEAD FINDINGS

Brain: No evidence of acute infarction, hemorrhage, hydrocephalus,
extra-axial collection or mass lesion/mass effect. Chronic atrophic
and ischemic changes are noted.

Vascular: No hyperdense vessel or unexpected calcification.

Skull: Normal. Negative for fracture or focal lesion.

Sinuses/Orbits: Postsurgical changes are noted along the lateral
aspect of the orbits bilaterally.

Other: Surgical staples are noted in the right forehead consistent
with the recent fall and laceration.

CT CERVICAL SPINE FINDINGS

Alignment: Stable retrolisthesis of C3 on C4 is noted.

Skull base and vertebrae: 7 cervical segments are well visualized.
Vertebral body height is well maintained. Multilevel osteophytic
changes and disc space narrowing is seen. Facet hypertrophic changes
are noted. No acute fracture or acute facet abnormality is noted.

Soft tissues and spinal canal: Atherosclerotic calcifications of the
carotid arteries are seen. No acute soft tissue abnormality is
noted.

Upper chest: Visualized lung apices are within normal limits.

Other: None
IMPRESSION: CT of the head: Right scalp laceration with surgical staples in
place.

Chronic atrophic and ischemic changes.

No acute intracranial abnormality is noted.

CT of the cervical spine: Multilevel degenerative changes similar to
that seen on the exam from earlier in the same day. No acute
fracture is noted.

## 2021-04-01 IMAGING — CT CT CERVICAL SPINE W/O CM
3 of 4 series · 11 of 35 positions shown, 13 images · non-contrast
Comparison: None.

CLINICAL DATA: Trauma

EXAM:
CT CERVICAL SPINE WITHOUT CONTRAST
TECHNIQUE: Multidetector CT imaging of the cervical spine was performed without
intravenous contrast. Multiplanar CT image reconstructions were also
generated.

[Series 5: sagittal bone · sagittal · 0.22mm/px · 5 of 64 slices shown, 6 images]
[im 22/64  bone]
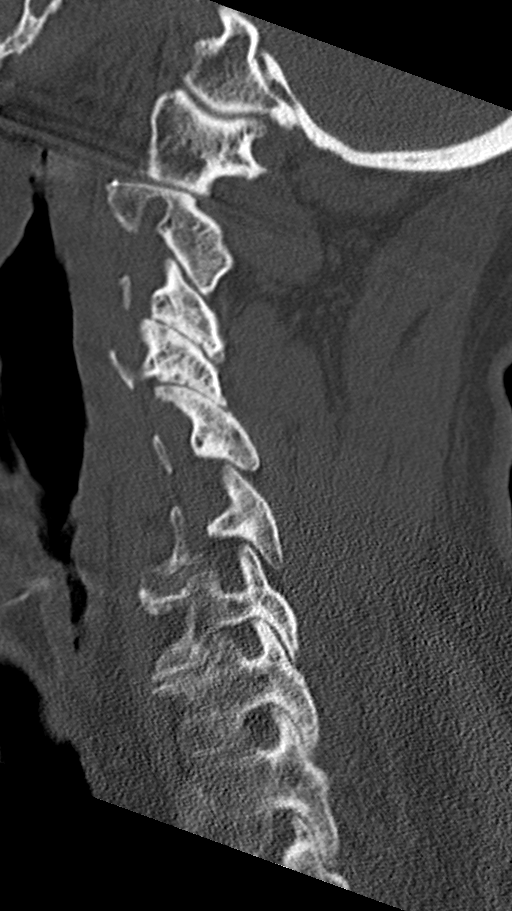
[im 27/64  bone]
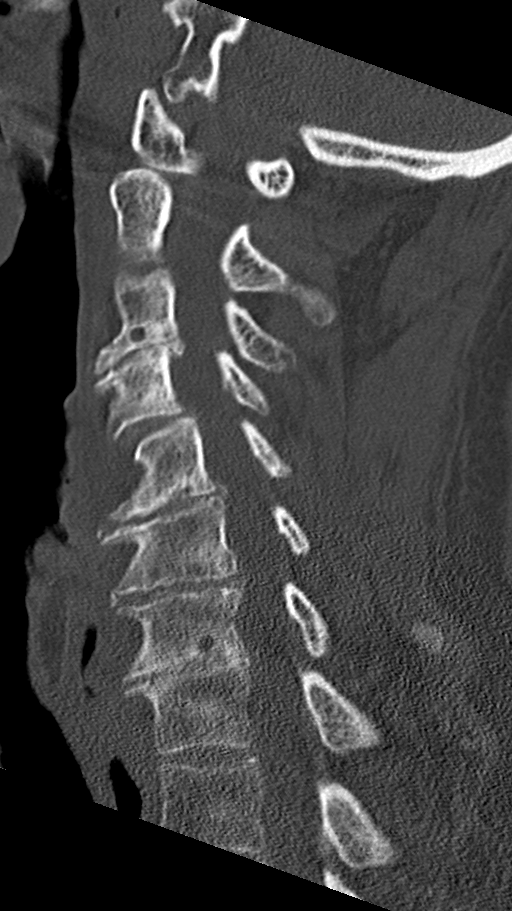
[im 32/64  soft-tissue]
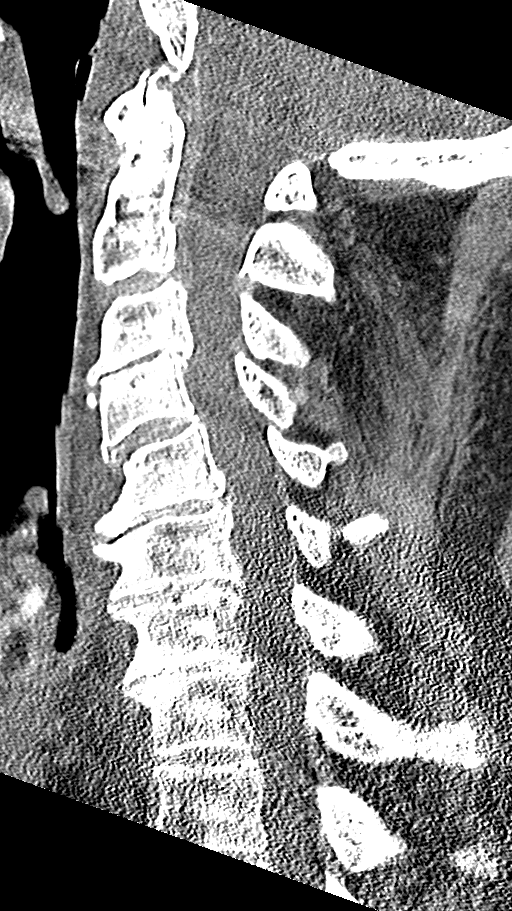
[im 32/64  bone]
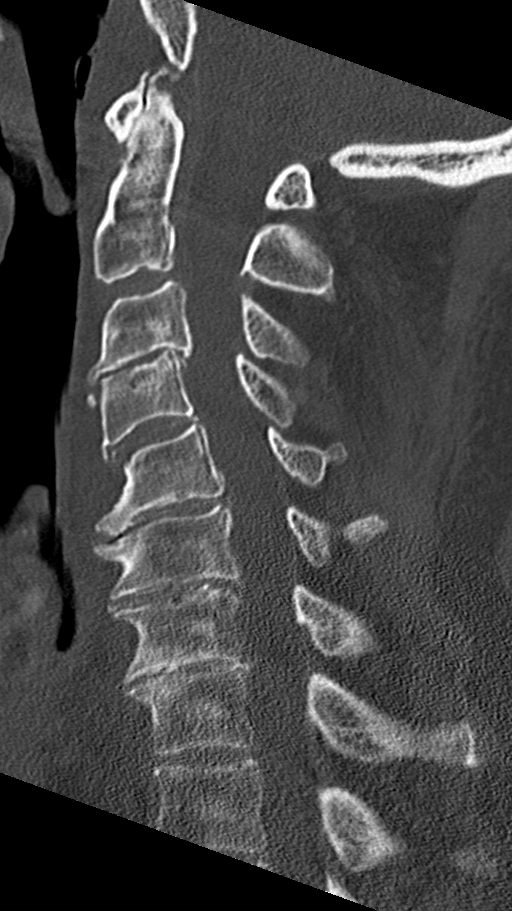
[im 37/64  bone]
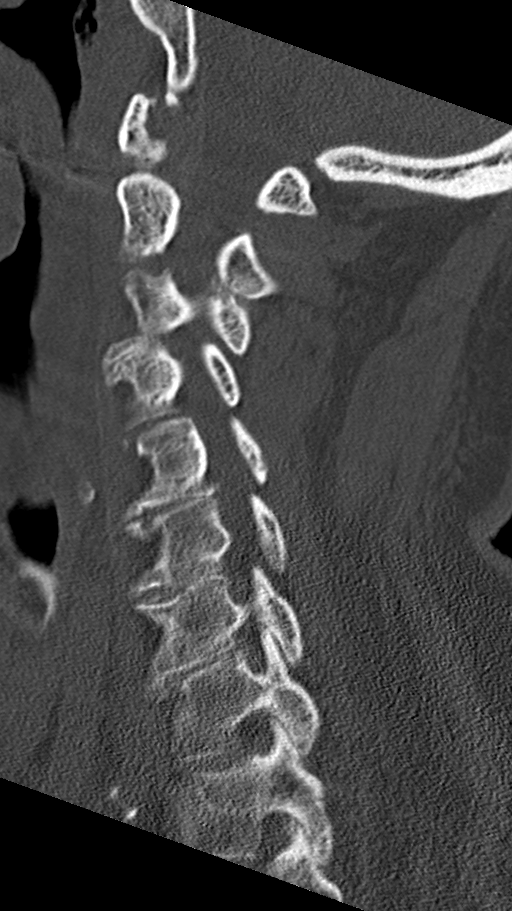
[im 43/64  bone]
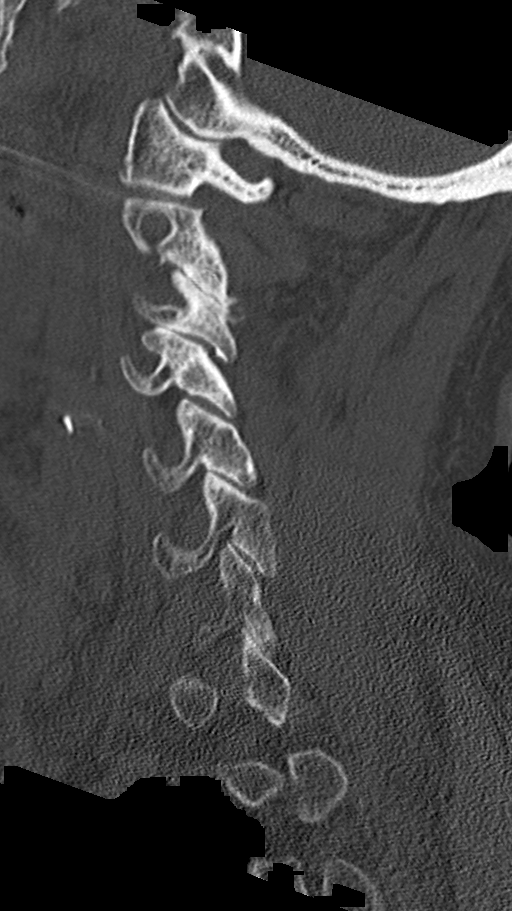

[Series 6: coronal bone · coronal · 0.25mm/px · 3 of 57 slices shown]
[im 12/57  bone]
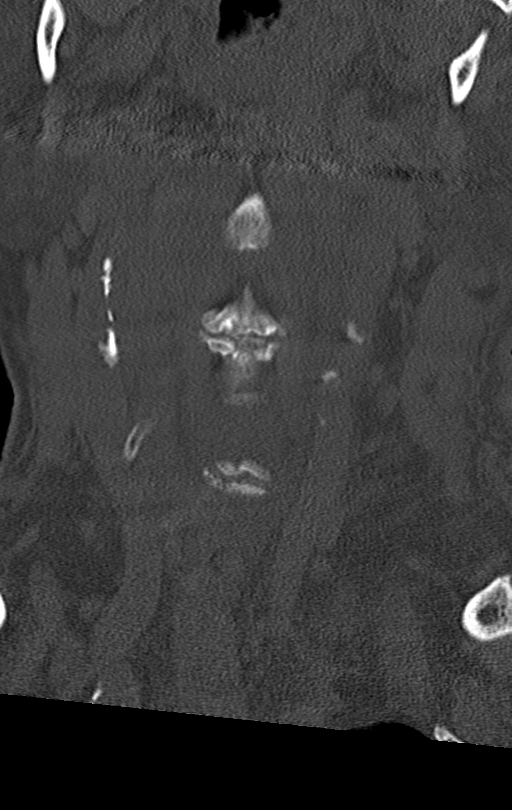
[im 23/57  bone]
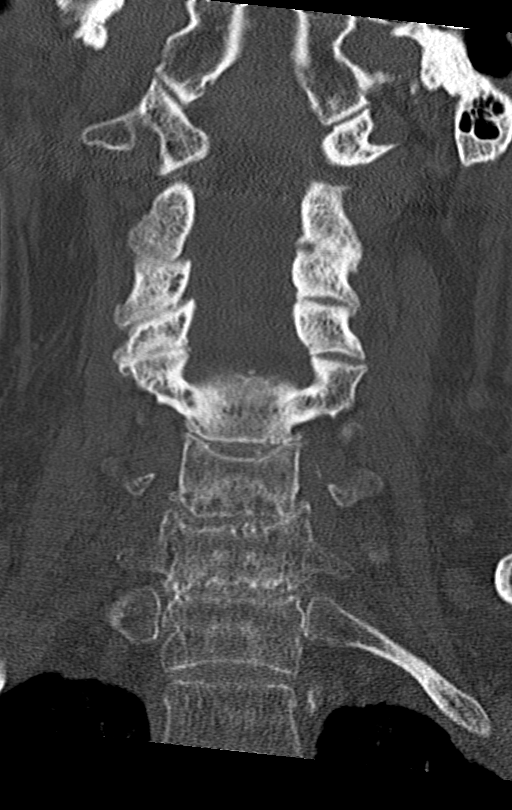
[im 34/57  bone]
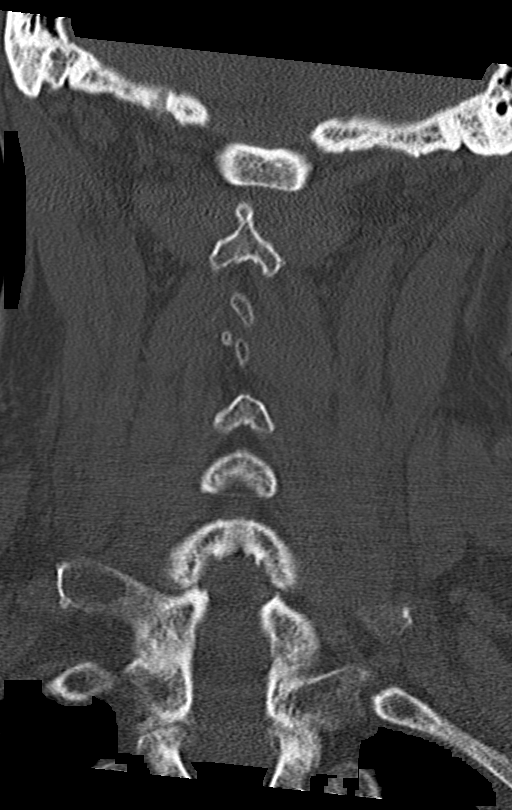

[Series 7: orthogonal axials · axial · 0.24mm/px · z∈[-319,-212]mm · 3 of 101 slices shown, 4 images]
[im 29/101  soft-tissue]
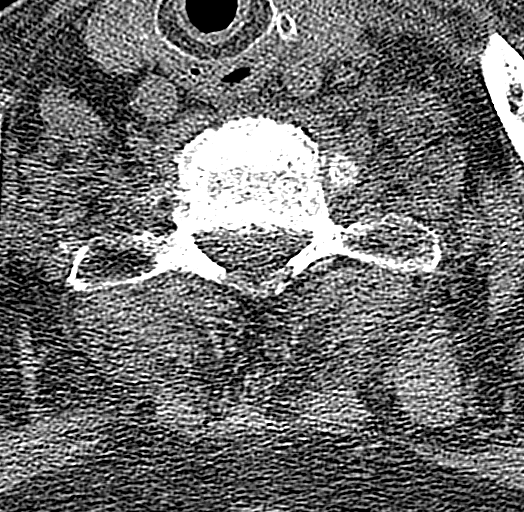
[im 29/101  bone]
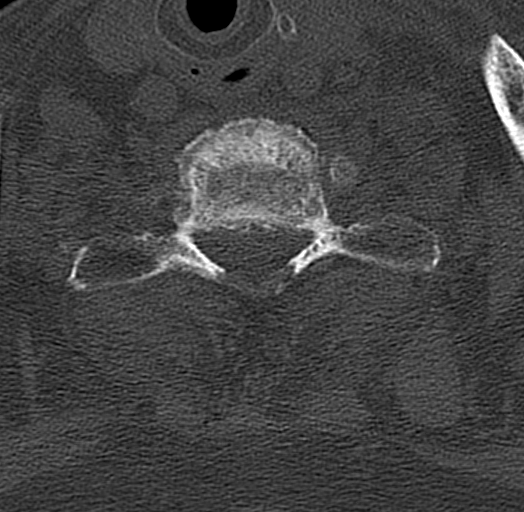
[im 58/101  bone]
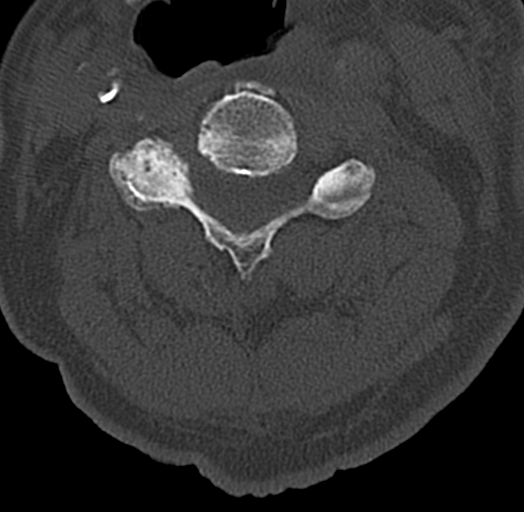
[im 86/101  bone]
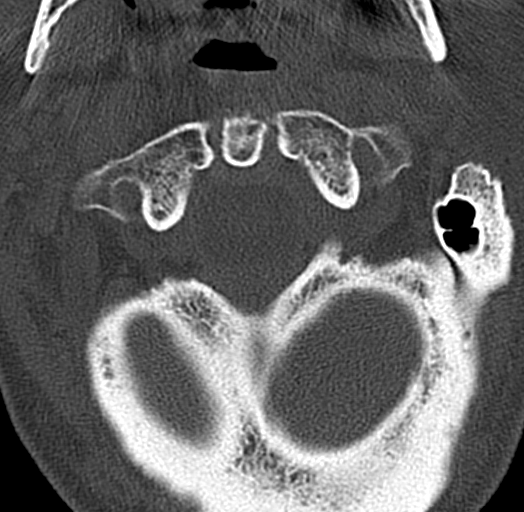

[11 of 35 positions shown; findings below may reference images not displayed]

FINDINGS: Alignment: There is minimal 2-3 mm retrolisthesis at C3-C4 level
which may be residual from previous ligament injury and facet
degeneration.

Skull base and vertebrae: No recent fracture is seen. Degenerative
changes are noted with disc space narrowing, bony spurs and facet
hypertrophy at multiple levels.

Soft tissues and spinal canal: There is extrinsic pressure over the
ventral margin of thecal sac caused by posterior bony spurs at
multiple levels with mild spinal stenosis, particularly at C3-C4,
C5-C6 and C6-C7 levels. There is encroachment of neural foramina at
C3-C4 level, more so on the right side. There is mild encroachment
of neural foramina at C4-C5 level. There is mild to moderate
encroachment of neural foramina at C5-C6 level, more so on the left
side. There is moderate encroachment of neural foramina at C6-C7 and
C7-T1 levels.

Disc levels:  As described above

Upper chest: Unremarkable.

Other: Thyroid appears smaller than usual in size. Arterial
calcifications are noted in the common carotid arteries and common
carotid bifurcations. There is mild edema in the subcutaneous plane
in the anterior neck. There are subcentimeter nodes in both sides of
neck.
IMPRESSION: No recent fracture is seen in the cervical spine. Cervical
spondylosis with spinal stenosis and encroachment of neural foramina
at multiple levels as described in the body of the report. Minimal
retrolisthesis at C3-C4 level may be residual from previous ligament
injury and facet degeneration.

## 2021-04-01 IMAGING — CT CT HEAD W/O CM
4 series · 17 of 47 positions shown, 19 images · non-contrast
Comparison: None.

CLINICAL DATA: Mental status changes.  Unwitnessed fall.

EXAM:
CT HEAD WITHOUT CONTRAST
TECHNIQUE: Contiguous axial images were obtained from the base of the skull
through the vertex without intravenous contrast.

[Series 2: head wo · axial · 0.44mm/px · z∈[-154,-34]mm · 7 of 34 slices shown, 9 images]
[im 5/34  brain]
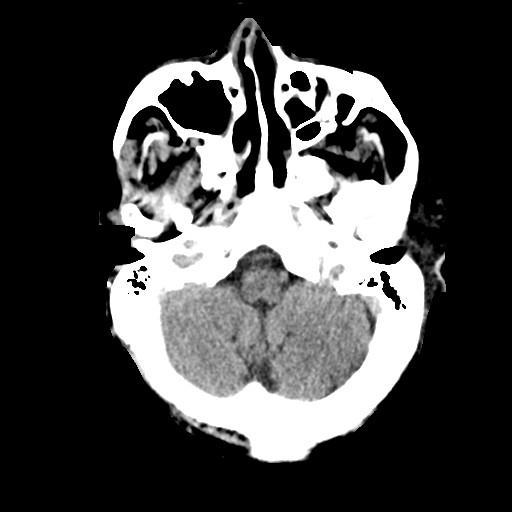
[im 5/34  bone]
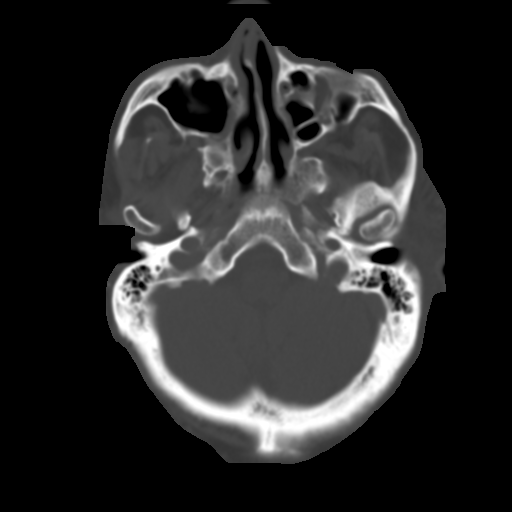
[im 9/34  brain]
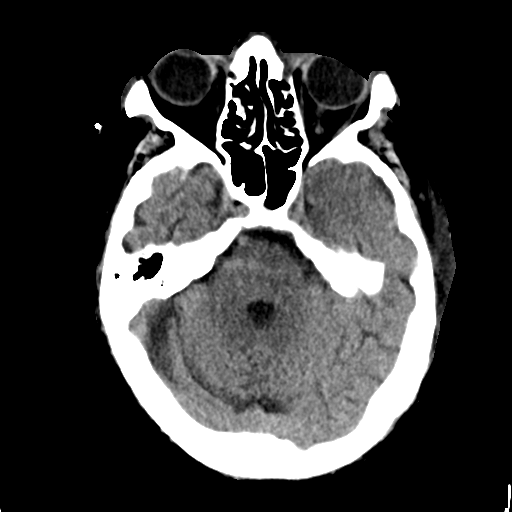
[im 13/34  brain]
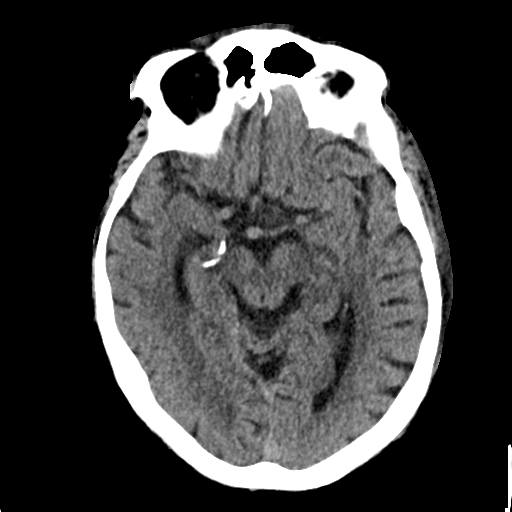
[im 17/34  brain]
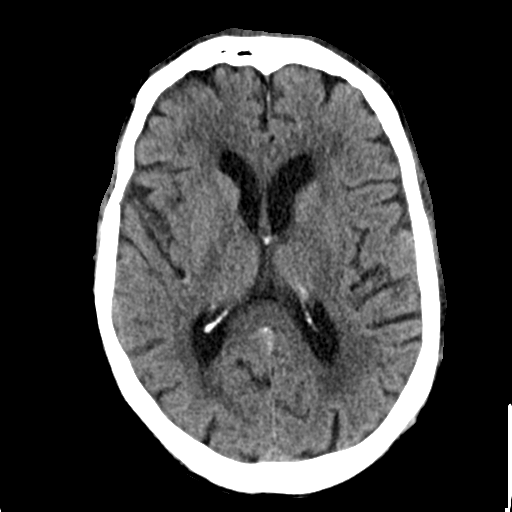
[im 21/34  brain]
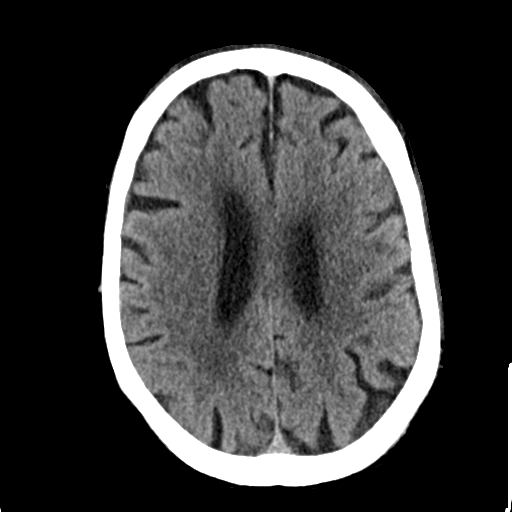
[im 21/34  bone]
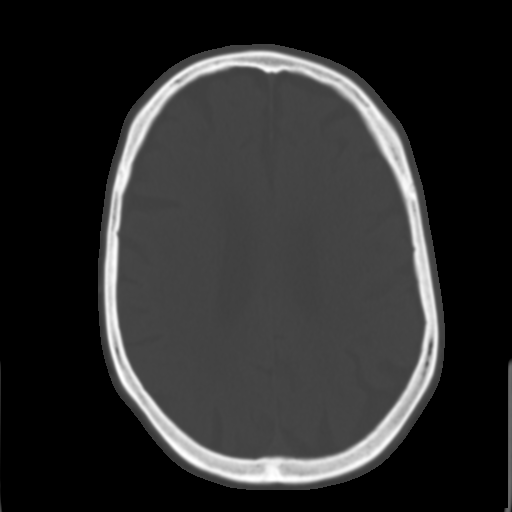
[im 25/34  brain]
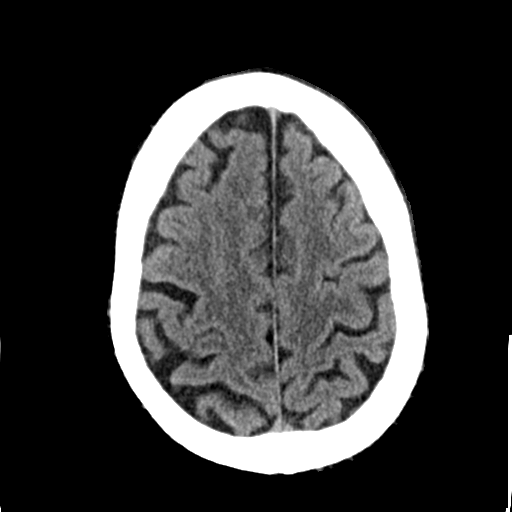
[im 29/34  brain]
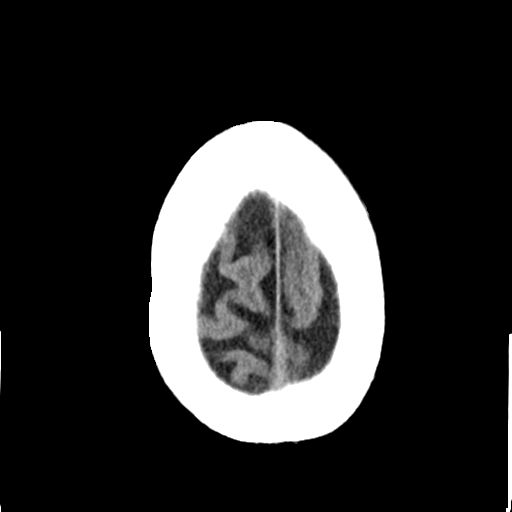

[Series 3: head bone · axial · 0.44mm/px · z∈[-158,-100]mm · 4 of 84 slices shown]
[im 9/84  bone]
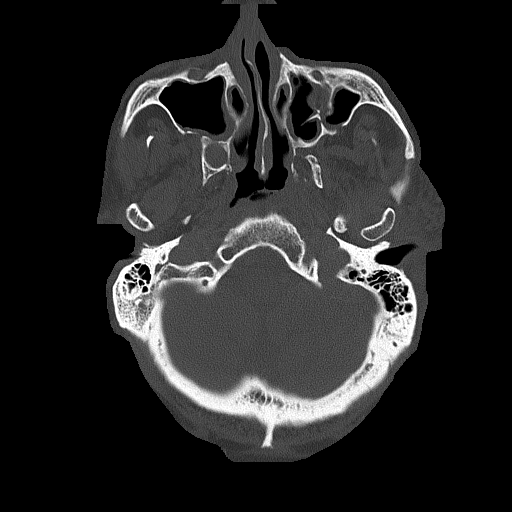
[im 17/84  bone]
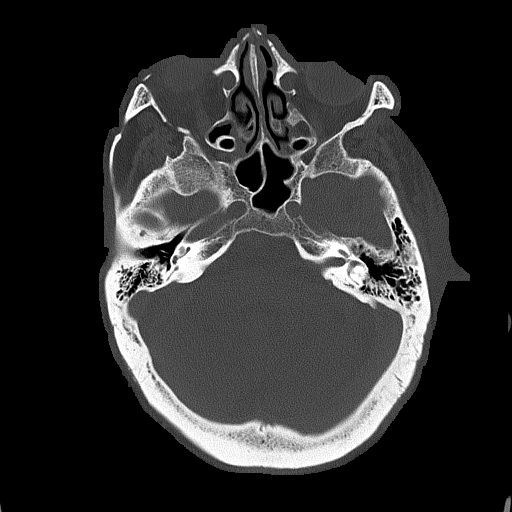
[im 25/84  bone]
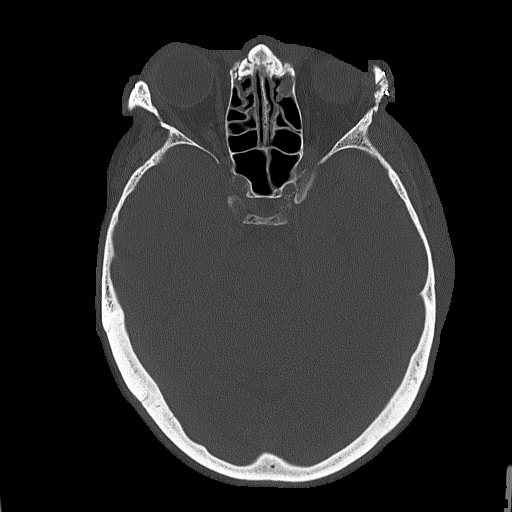
[im 38/84  bone]
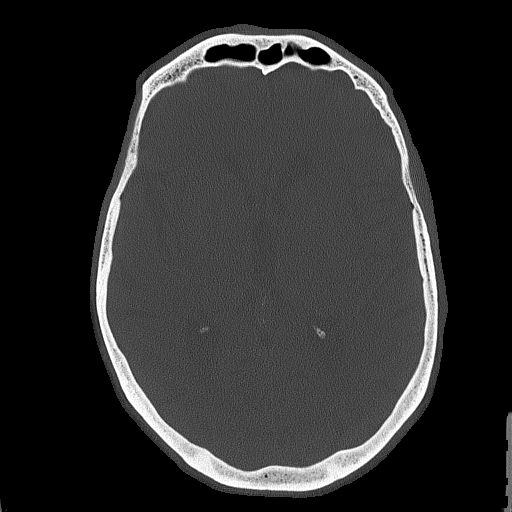

[Series 4: coronal soft tissue · coronal · 0.33mm/px · 3 of 75 slices shown]
[im 28/75  brain]
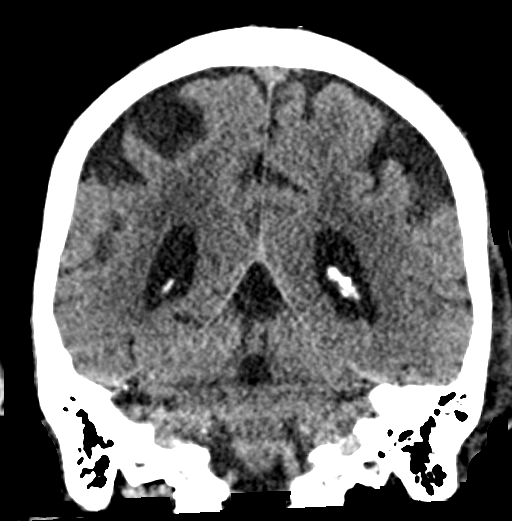
[im 34/75  brain]
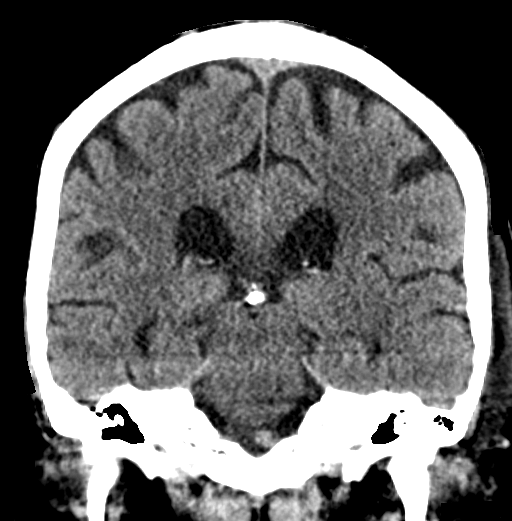
[im 41/75  brain]
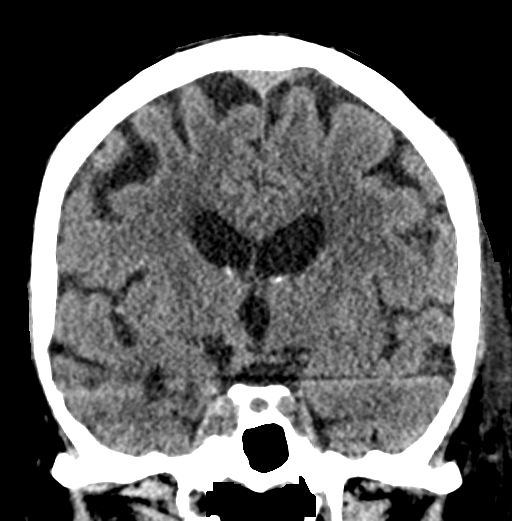

[Series 5: sagittal soft tissue · sagittal · 0.34mm/px · 3 of 65 slices shown]
[im 22/65  brain]
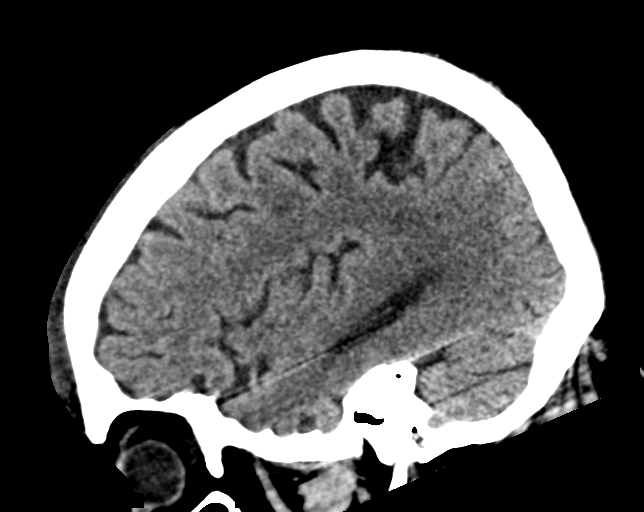
[im 33/65  brain]
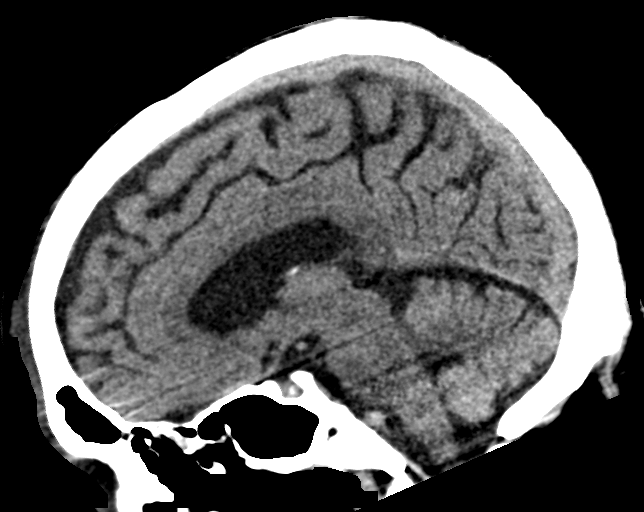
[im 43/65  brain]
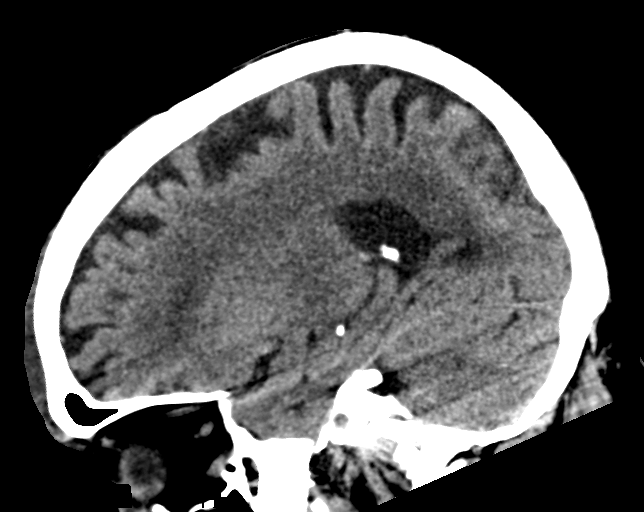

[17 of 47 positions shown; findings below may reference images not displayed]

FINDINGS: Brain: There is atrophy and chronic small vessel disease changes. No
acute intracranial abnormality. Specifically, no hemorrhage,
hydrocephalus, mass lesion, acute infarction, or significant
intracranial injury.

Vascular: No hyperdense vessel or unexpected calcification.

Skull: No acute calvarial abnormality.

Sinuses/Orbits: Mucosal thickening throughout paranasal sinuses. No
acute findings

Other: None
IMPRESSION: Atrophy, chronic microvascular disease.

No acute intracranial abnormality.

Chronic sinusitis.

## 2021-04-01 IMAGING — DX DG CHEST 1V PORT
1 series · 1 of 1 positions shown · non-contrast
Comparison: None.

CLINICAL DATA: Altered mental status, COVID

EXAM:
PORTABLE CHEST 1 VIEW

[chest ap]
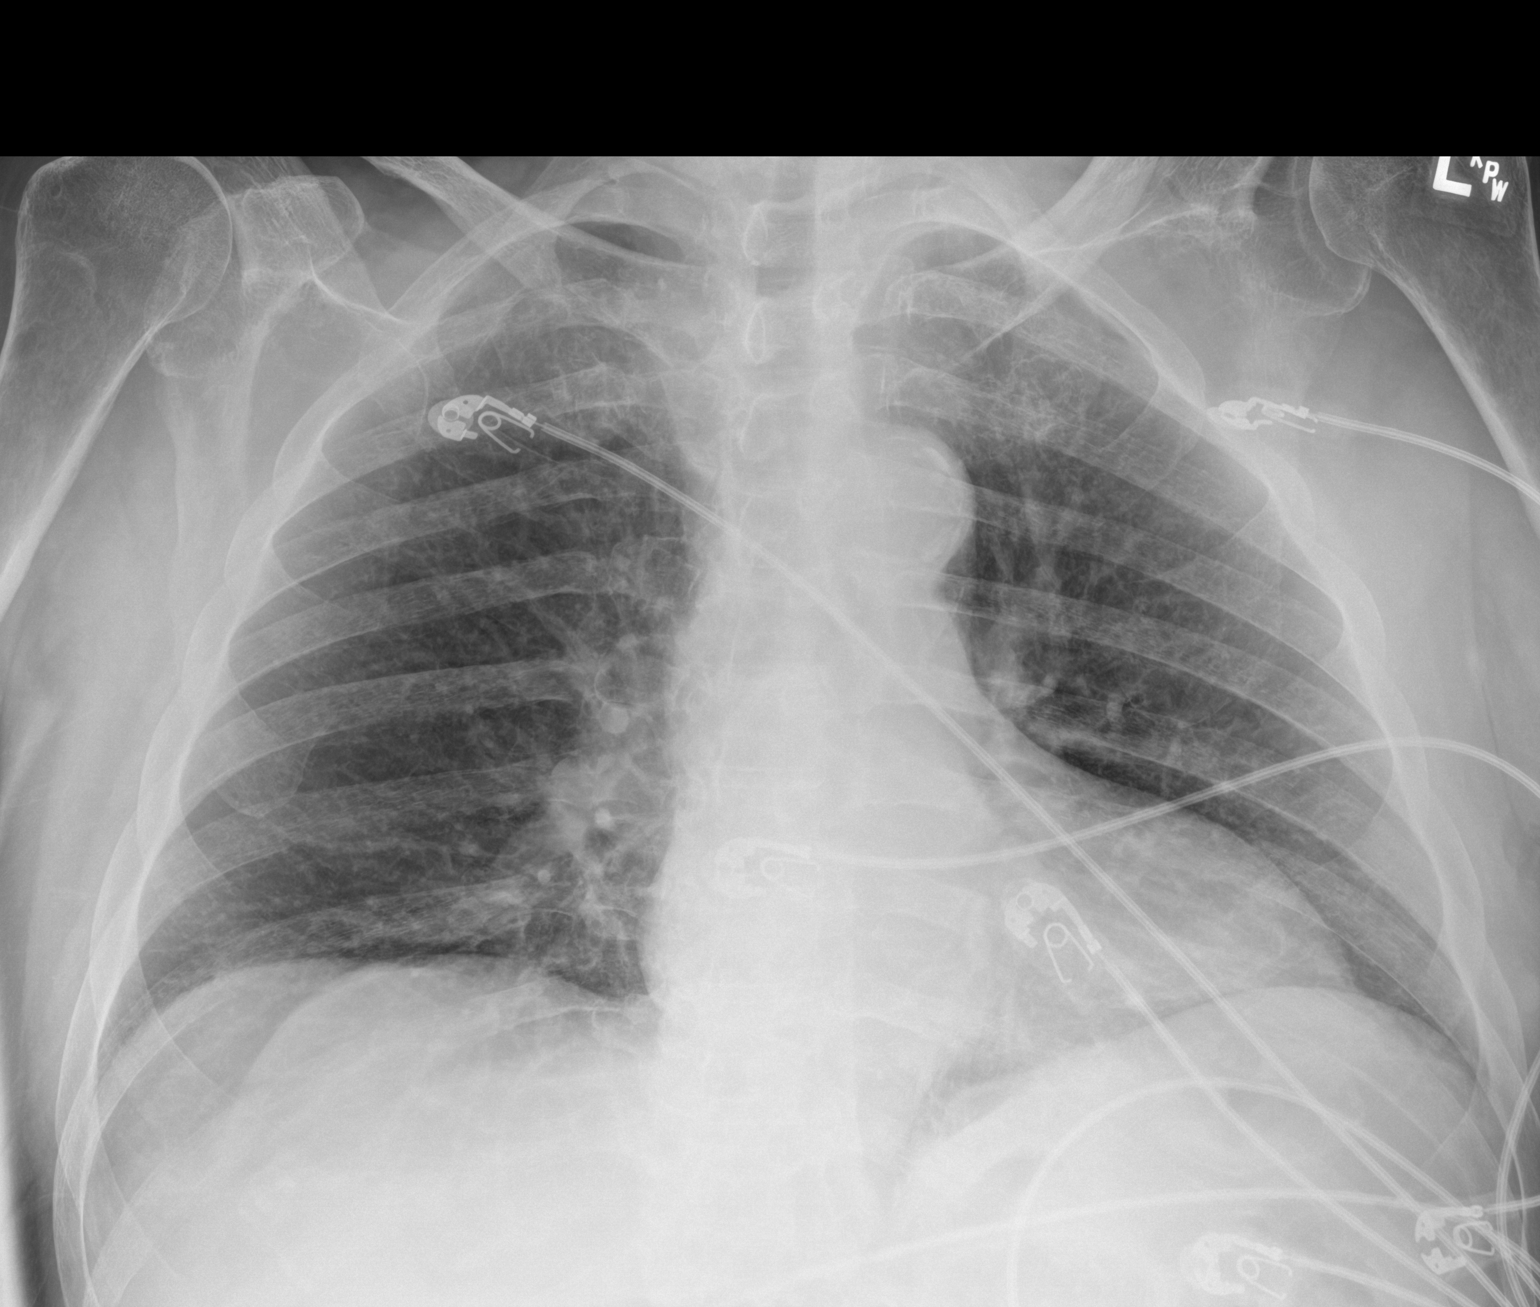

[1 of 1 positions shown; findings below may reference images not displayed]

FINDINGS: Low lung volumes, bibasilar atelectasis. Heart is normal size.
Aortic calcifications. No effusions or acute bony abnormality.
IMPRESSION: Low volumes, bibasilar atelectasis.

Aortic atherosclerosis.

## 2021-04-01 IMAGING — CR DG HIP (WITH OR WITHOUT PELVIS) 2-3V*R*
1 series · 3 of 3 positions shown · non-contrast
Comparison: None.

CLINICAL DATA: Recent fall with hip pain, initial encounter

EXAM:
DG HIP (WITH OR WITHOUT PELVIS) 3V RIGHT

[Series 1: dg hip unilat w or w/o pelvis 2-3 views  · non-contrast · 0.14mm/px · 3 of 3 slices shown]
[im 1/3]
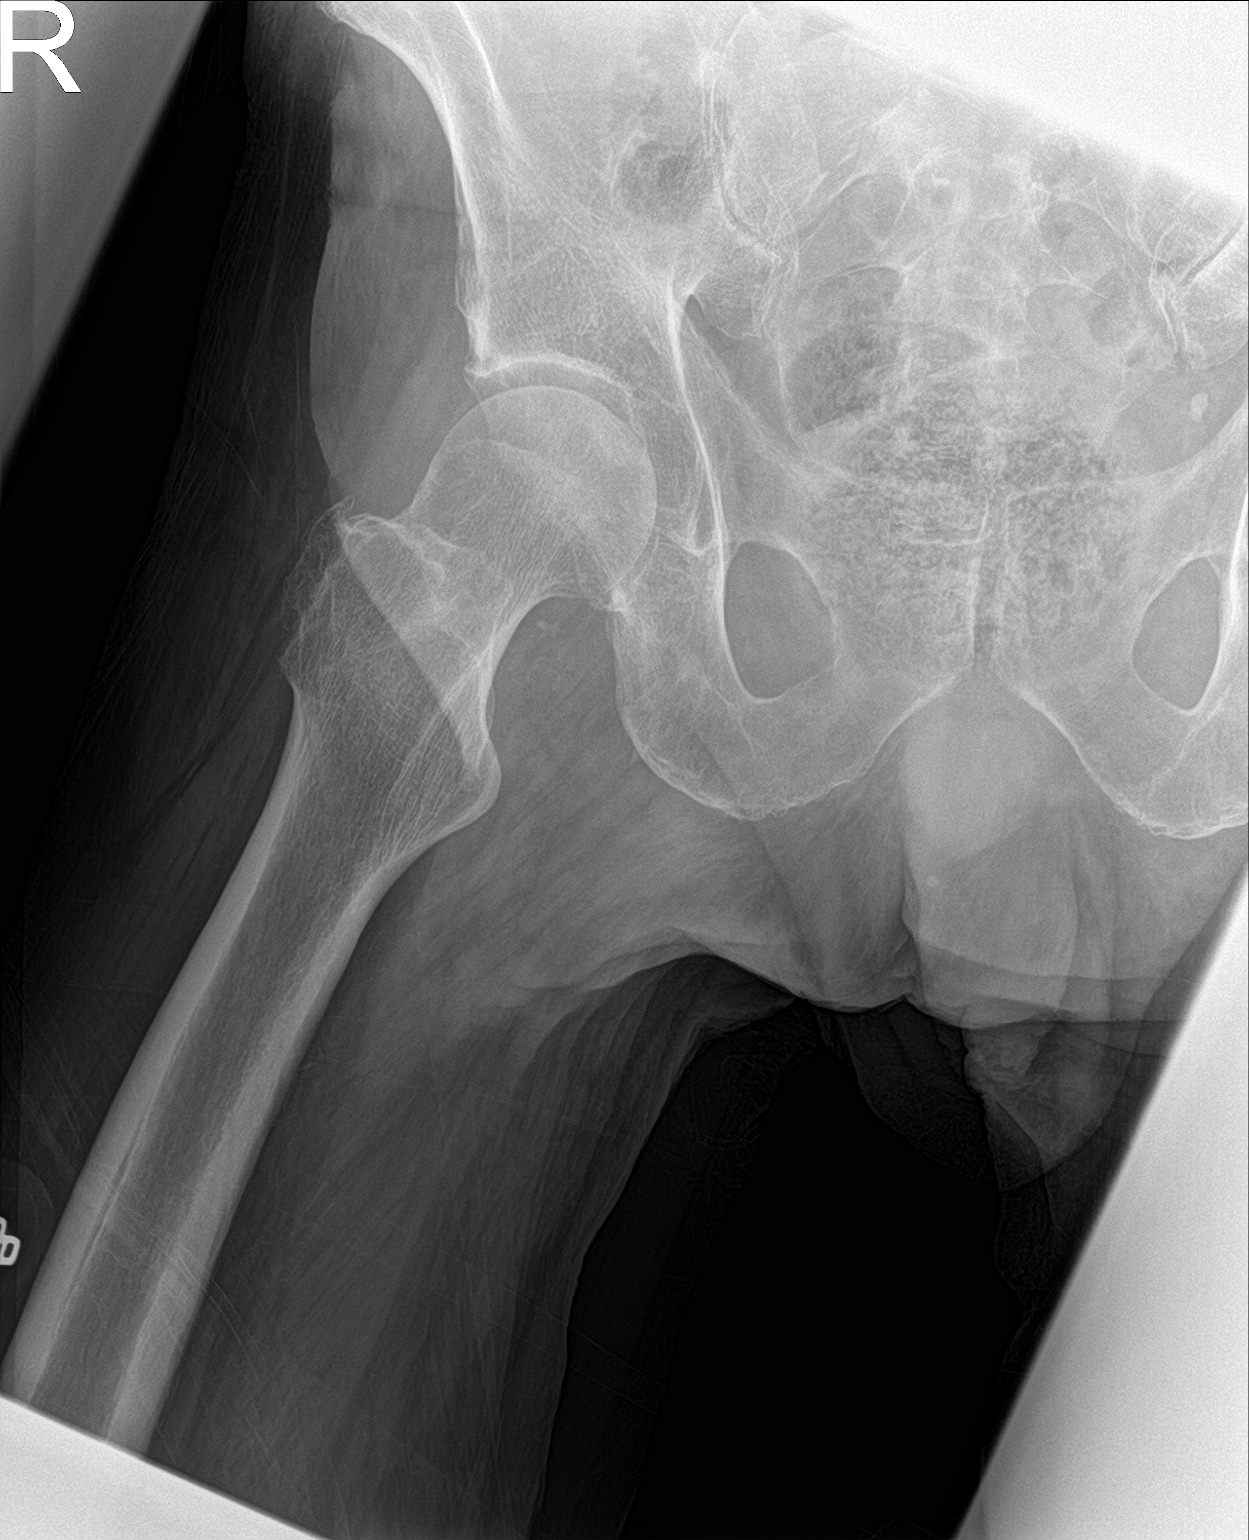
[im 2/3]
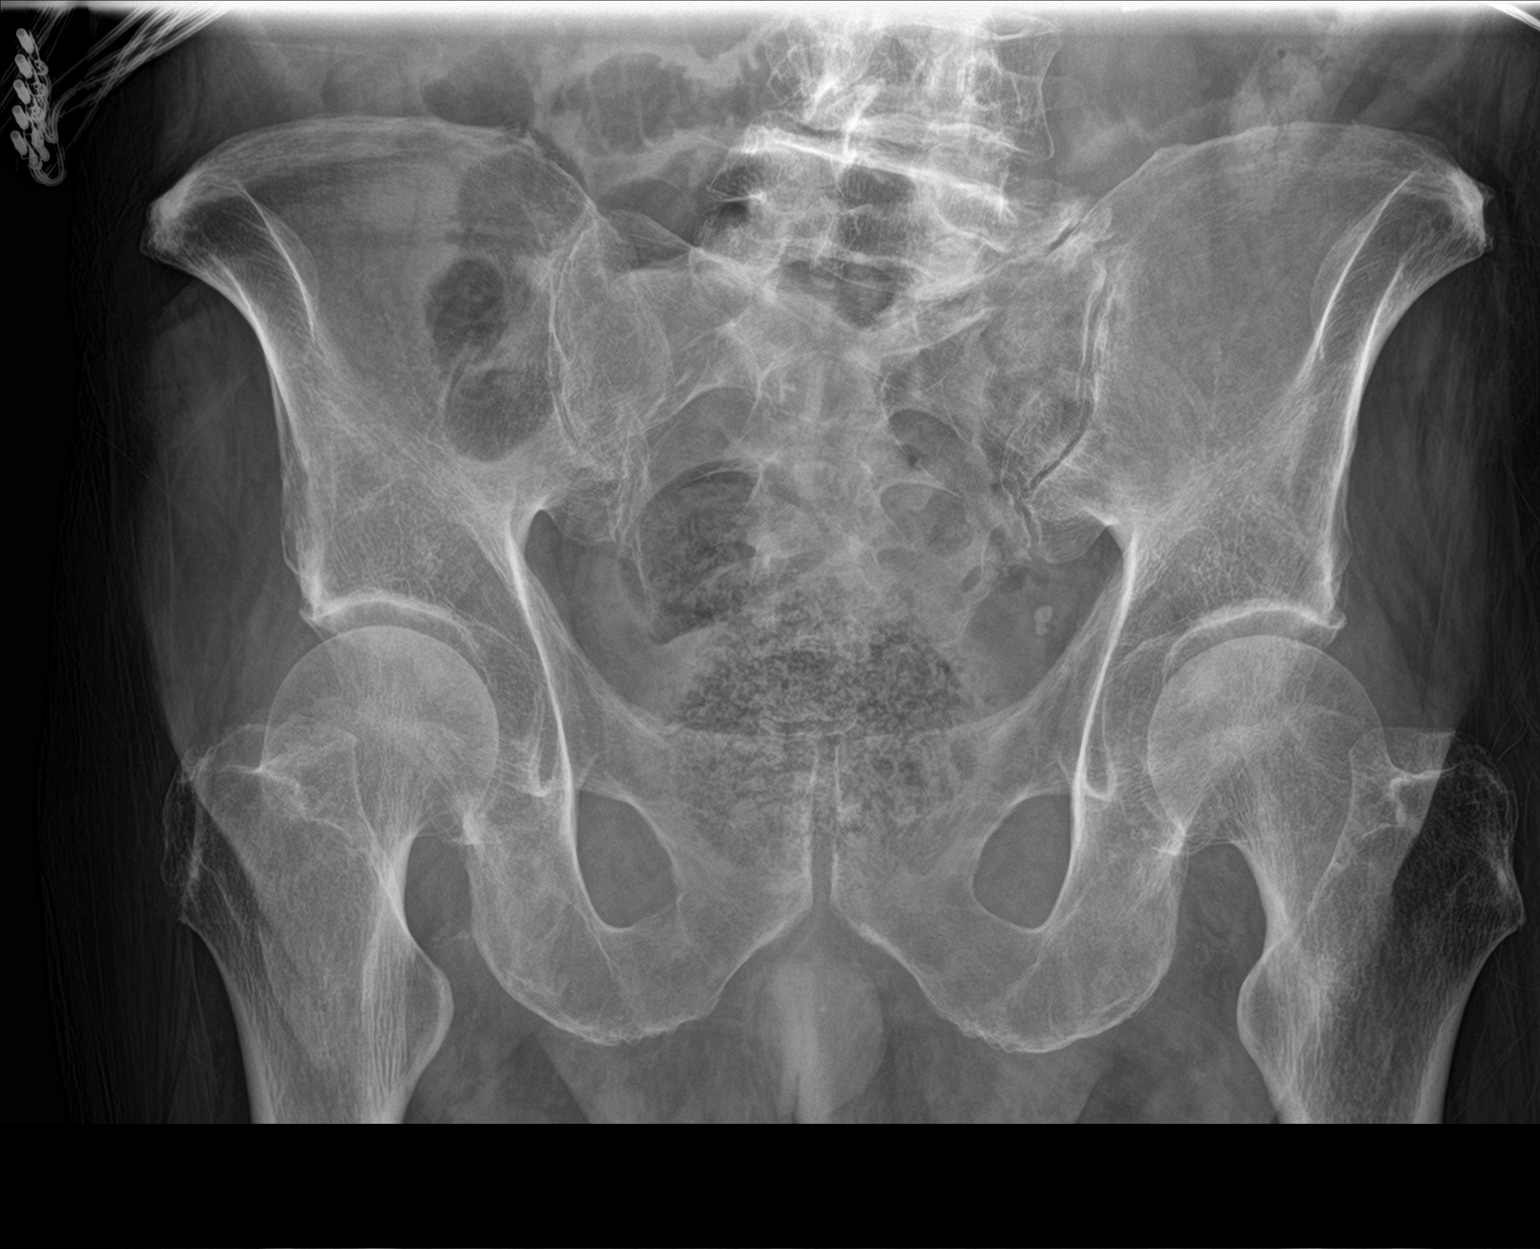
[im 3/3]
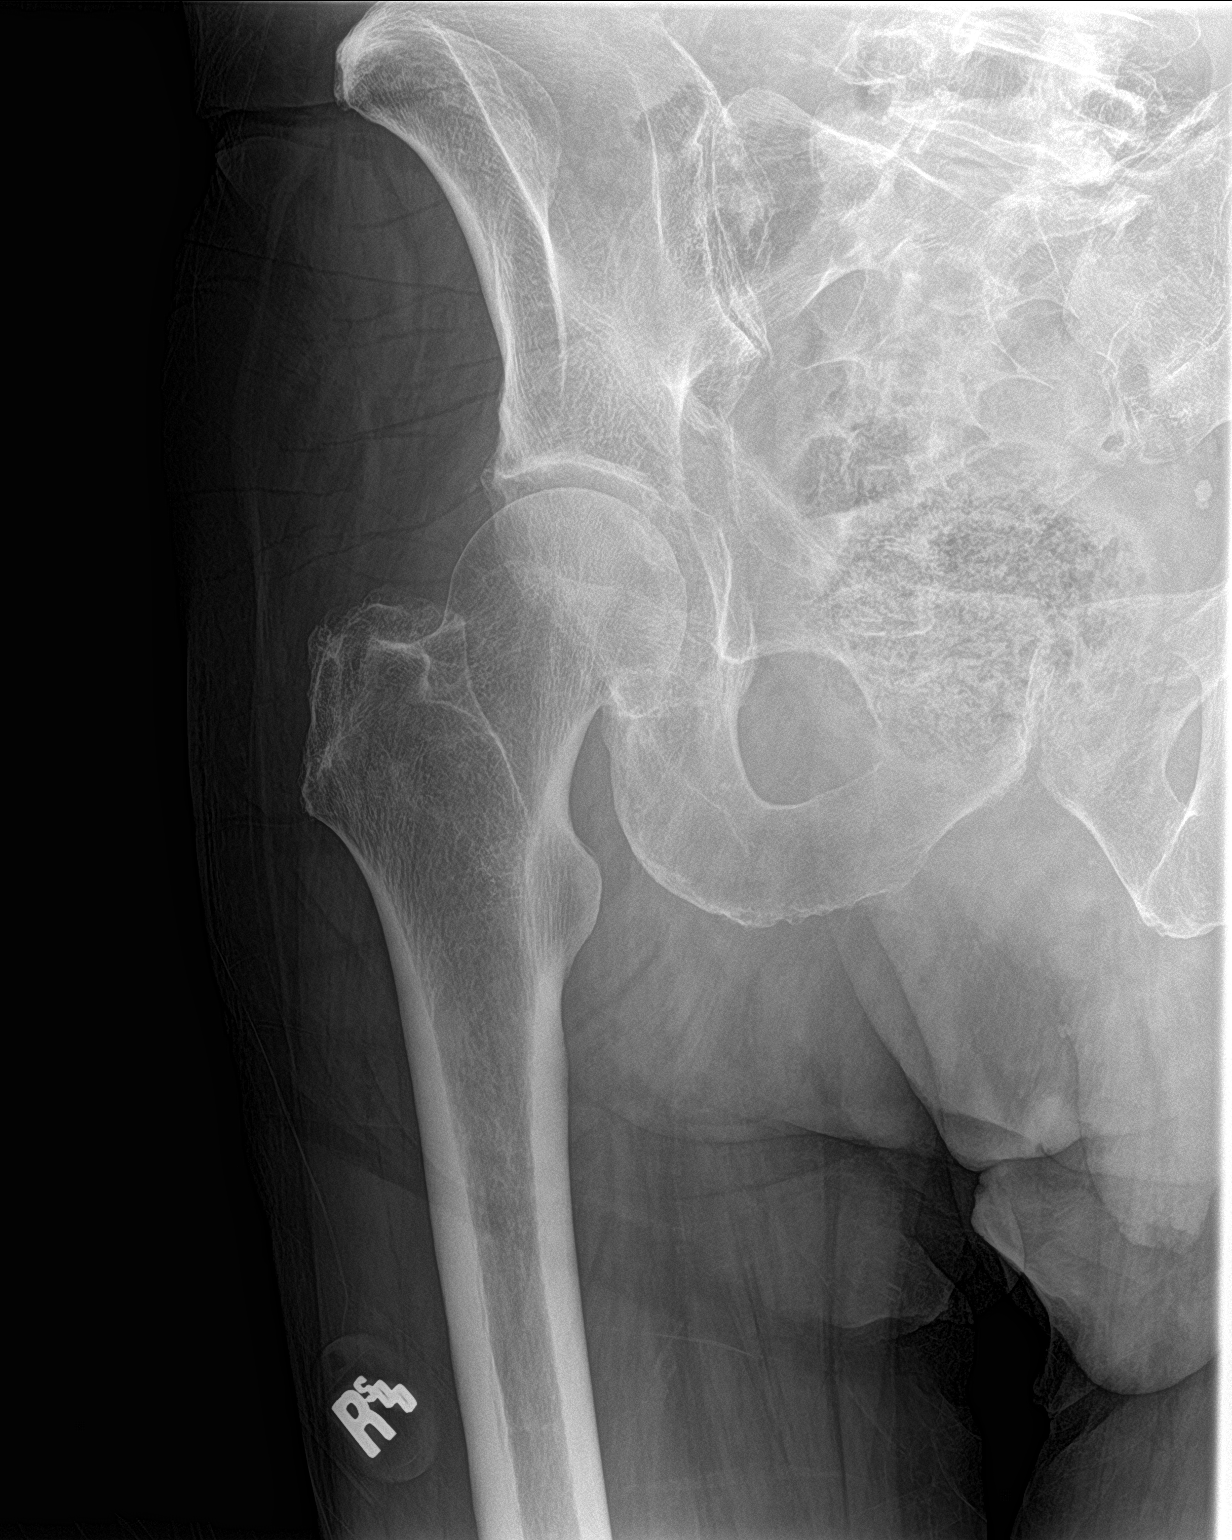

[3 of 3 positions shown; findings below may reference images not displayed]

FINDINGS: Pelvic ring is intact. Degenerative changes of lumbar spine are
seen. No acute fracture or dislocation is noted. No soft tissue
abnormality is seen.
IMPRESSION: No acute abnormality noted.

## 2021-04-01 MED ORDER — ZINC SULFATE 220 (50 ZN) MG PO CAPS
220.0000 mg | ORAL_CAPSULE | Freq: Every day | ORAL | Status: DC
  Administered 2021-04-02 – 2021-04-10 (×9): 220 mg via ORAL
  Filled 2021-04-01 (×10): qty 1

## 2021-04-01 MED ORDER — SODIUM CHLORIDE 0.9 % IV BOLUS
1000.0000 mL | Freq: Once | INTRAVENOUS | Status: AC
  Administered 2021-04-01: 16:00:00 1000 mL via INTRAVENOUS

## 2021-04-01 MED ORDER — VITAMIN D3 25 MCG (1000 UNIT) PO TABS
2000.0000 [IU] | ORAL_TABLET | Freq: Every day | ORAL | Status: DC
  Administered 2021-04-02 – 2021-04-10 (×9): 2000 [IU] via ORAL
  Filled 2021-04-01 (×17): qty 2

## 2021-04-01 MED ORDER — INSULIN ASPART 100 UNIT/ML IJ SOLN
0.0000 [IU] | Freq: Every day | INTRAMUSCULAR | Status: DC
  Administered 2021-04-05: 21:00:00 2 [IU] via SUBCUTANEOUS
  Administered 2021-04-06 – 2021-04-07 (×2): 3 [IU] via SUBCUTANEOUS
  Filled 2021-04-01 (×3): qty 1

## 2021-04-01 MED ORDER — ACETAMINOPHEN 325 MG PO TABS
650.0000 mg | ORAL_TABLET | Freq: Four times a day (QID) | ORAL | Status: DC | PRN
  Administered 2021-04-02 – 2021-04-09 (×7): 650 mg via ORAL
  Filled 2021-04-01 (×7): qty 2

## 2021-04-01 MED ORDER — ADULT MULTIVITAMIN W/MINERALS CH
1.0000 | ORAL_TABLET | Freq: Every day | ORAL | Status: DC
  Administered 2021-04-02 – 2021-04-10 (×9): 1 via ORAL
  Filled 2021-04-01 (×9): qty 1

## 2021-04-01 MED ORDER — NADOLOL 40 MG PO TABS
40.0000 mg | ORAL_TABLET | Freq: Every day | ORAL | Status: DC
  Filled 2021-04-01: qty 1

## 2021-04-01 MED ORDER — SPIRONOLACTONE 25 MG PO TABS: 50.0000 mg | ORAL_TABLET | Freq: Every day | ORAL | Status: DC

## 2021-04-01 MED ORDER — ENOXAPARIN SODIUM 40 MG/0.4ML IJ SOSY: 40.0000 mg | PREFILLED_SYRINGE | INTRAMUSCULAR | Status: DC

## 2021-04-01 MED ORDER — SODIUM CHLORIDE 0.9 % IV SOLN: INTRAVENOUS | Status: DC

## 2021-04-01 MED ORDER — OMEGA-3-ACID ETHYL ESTERS 1 G PO CAPS
1.0000 g | ORAL_CAPSULE | Freq: Every day | ORAL | Status: DC
  Administered 2021-04-02 – 2021-04-10 (×9): 1 g via ORAL
  Filled 2021-04-01 (×10): qty 1

## 2021-04-01 MED ORDER — GUAIFENESIN-DM 100-10 MG/5ML PO SYRP
10.0000 mL | ORAL_SOLUTION | ORAL | Status: DC | PRN
  Administered 2021-04-05 – 2021-04-07 (×4): 10 mL via ORAL
  Filled 2021-04-01 (×4): qty 10

## 2021-04-01 MED ORDER — LISINOPRIL 20 MG PO TABS
40.0000 mg | ORAL_TABLET | Freq: Every day | ORAL | Status: DC
  Filled 2021-04-01: qty 2

## 2021-04-01 MED ORDER — ONDANSETRON HCL 4 MG/2ML IJ SOLN: 4.0000 mg | Freq: Four times a day (QID) | INTRAMUSCULAR | Status: DC | PRN

## 2021-04-01 MED ORDER — LEVOTHYROXINE SODIUM 175 MCG PO TABS
175.0000 ug | ORAL_TABLET | Freq: Every day | ORAL | Status: DC
  Administered 2021-04-02 – 2021-04-10 (×9): 175 ug via ORAL
  Filled 2021-04-01 (×10): qty 1

## 2021-04-01 MED ORDER — INSULIN ASPART 100 UNIT/ML IJ SOLN
0.0000 [IU] | Freq: Three times a day (TID) | INTRAMUSCULAR | Status: DC
  Administered 2021-04-02 – 2021-04-03 (×4): 2 [IU] via SUBCUTANEOUS
  Administered 2021-04-04: 17:00:00 1 [IU] via SUBCUTANEOUS
  Administered 2021-04-04: 13:00:00 2 [IU] via SUBCUTANEOUS
  Administered 2021-04-05: 18:00:00 7 [IU] via SUBCUTANEOUS
  Administered 2021-04-05: 14:00:00 5 [IU] via SUBCUTANEOUS
  Administered 2021-04-06: 17:00:00 3 [IU] via SUBCUTANEOUS
  Administered 2021-04-06: 13:00:00 1 [IU] via SUBCUTANEOUS
  Administered 2021-04-07: 5 [IU] via SUBCUTANEOUS
  Administered 2021-04-07: 12:00:00 3 [IU] via SUBCUTANEOUS
  Administered 2021-04-07: 1 [IU] via SUBCUTANEOUS
  Administered 2021-04-08: 17:00:00 3 [IU] via SUBCUTANEOUS
  Administered 2021-04-08: 1 [IU] via SUBCUTANEOUS
  Administered 2021-04-09 (×2): 3 [IU] via SUBCUTANEOUS
  Administered 2021-04-10: 2 [IU] via SUBCUTANEOUS
  Filled 2021-04-01 (×16): qty 1

## 2021-04-01 MED ORDER — PANTOPRAZOLE SODIUM 40 MG PO TBEC
40.0000 mg | DELAYED_RELEASE_TABLET | Freq: Two times a day (BID) | ORAL | Status: DC
  Administered 2021-04-02 – 2021-04-10 (×17): 40 mg via ORAL
  Filled 2021-04-01 (×18): qty 1

## 2021-04-01 MED ORDER — SODIUM CHLORIDE 0.9 % IV SOLN
200.0000 mg | Freq: Once | INTRAVENOUS | Status: AC
  Administered 2021-04-02: 01:00:00 200 mg via INTRAVENOUS
  Filled 2021-04-01: qty 40

## 2021-04-01 MED ORDER — VITAMIN E 45 MG (100 UNIT) PO CAPS
400.0000 [IU] | ORAL_CAPSULE | Freq: Every day | ORAL | Status: DC
  Administered 2021-04-02 – 2021-04-10 (×9): 400 [IU] via ORAL
  Filled 2021-04-01 (×11): qty 4

## 2021-04-01 MED ORDER — ONDANSETRON HCL 4 MG PO TABS: 4.0000 mg | ORAL_TABLET | Freq: Four times a day (QID) | ORAL | Status: DC | PRN

## 2021-04-01 MED ORDER — SODIUM CHLORIDE 0.9 % IV SOLN
100.0000 mg | Freq: Every day | INTRAVENOUS | Status: AC
  Administered 2021-04-02 – 2021-04-05 (×4): 100 mg via INTRAVENOUS
  Filled 2021-04-01 (×2): qty 100
  Filled 2021-04-01: qty 20
  Filled 2021-04-01 (×2): qty 100

## 2021-04-01 MED ORDER — ASCORBIC ACID 500 MG PO TABS
500.0000 mg | ORAL_TABLET | Freq: Every day | ORAL | Status: DC
  Administered 2021-04-02 – 2021-04-10 (×9): 500 mg via ORAL
  Filled 2021-04-01 (×10): qty 1

## 2021-04-01 MED ORDER — ALBUTEROL SULFATE HFA 108 (90 BASE) MCG/ACT IN AERS
2.0000 | INHALATION_SPRAY | Freq: Four times a day (QID) | RESPIRATORY_TRACT | Status: DC
  Administered 2021-04-02 – 2021-04-10 (×31): 2 via RESPIRATORY_TRACT
  Filled 2021-04-01 (×2): qty 6.7

## 2021-04-01 MED ORDER — INSULIN GLARGINE-YFGN 100 UNIT/ML ~~LOC~~ SOLN
5.0000 [IU] | Freq: Every day | SUBCUTANEOUS | Status: DC
  Administered 2021-04-02 – 2021-04-09 (×9): 5 [IU] via SUBCUTANEOUS
  Filled 2021-04-01 (×11): qty 0.05

## 2021-04-01 NOTE — ED Provider Notes (Signed)
[facility] Emergency Department Provider Note  Time seen: 3:54 PM  I have reviewed the triage vital signs and the nursing notes.   HISTORY  Chief Complaint Fall and Altered Mental Status   HPI Charles Wilkinson is a 75 y.o. male with a past medical history of diabetes, hypertension, hyperlipidemia, hypothyroidism, presents to the emergency department after being found down at his nursing facility.  According to EMS report patient lives at [facility] independent living.  Patient was found to be on the floor facedown covered in urine and feces.  Patient is confused does not recall falling does not know how long he has been on the ground.  Patient states the year is 2012.  On examination patient appears to have significant swelling around the face, as if he has been down for a prolonged time.   No past medical history on file.  There are no problems to display for this patient.   Prior to Admission medications   Medication Sig Start Date End Date Taking? Authorizing Provider  insulin aspart (NOVOLOG) 100 UNIT/ML FlexPen Inject 2 Units into the skin in the morning, at noon, and at bedtime. 02/01/21 02/01/22 Yes [provider]  insulin glargine (LANTUS) 100 UNIT/ML injection Inject 5 Units into the skin at bedtime. 02/01/21 02/01/22 Yes [provider]  sucralfate (CARAFATE) 1 GM/10ML suspension Take 5 mLs by mouth 4 (four) times daily -  before meals and at bedtime. 02/01/21 02/01/22 Yes [provider]  ascorbic acid (VITAMIN C) 1000 MG tablet Take 1 tablet by mouth daily.    [provider]  Cholecalciferol 50 MCG (2000 UT) CAPS Take 2,000 Units by mouth daily.    [provider]  glipiZIDE (GLUCOTROL XL) 2.5 MG 24 hr tablet Take 2.5 mg by mouth daily. 02/01/21   [provider]  hydrochlorothiazide (HYDRODIURIL) 12.5 MG tablet Take 12.5 mg by mouth daily. 02/01/21   [provider]  Lecithin 1200 MG CAPS  Take 1 capsule by mouth daily.    [provider]  levothyroxine (SYNTHROID) 175 MCG tablet Take 175 mcg by mouth daily. 02/01/21   [provider]  lisinopril (ZESTRIL) 40 MG tablet Take 40 mg by mouth daily. 02/01/21   [provider]  metFORMIN (GLUCOPHAGE) 500 MG tablet Take 500 mg by mouth 2 (two) times daily. 03/18/21   [provider]  Multiple Vitamin (MULTI-VITAMIN) tablet Take 1 tablet by mouth daily.    [provider]  nadolol (CORGARD) 40 MG tablet Take 40 mg by mouth daily. 02/01/21   [provider]  Omega-3 Fatty Acids (OMEGA-3 FISH OIL) 1200 MG CAPS Take 1 capsule by mouth daily.    [provider]  pantoprazole (PROTONIX) 40 MG tablet Take 40 mg by mouth 2 (two) times daily. 01/02/21   [provider]  simvastatin (ZOCOR) 10 MG tablet Take 10 mg by mouth at bedtime. 02/01/21   [provider]  spironolactone (ALDACTONE) 50 MG tablet Take 50 mg by mouth daily. 03/22/21   [provider]  Vitamin E (VITAMIN E/D-ALPHA NATURAL) 268 MG (400 UNIT) CAPS Take 1 tablet by mouth daily.    [provider]    Not on File  No family history on file.  Social History    Review of Systems Unable to obtain adequate/accurate review of systems secondary to altered mental state/confusion.  ____________________________________________   PHYSICAL EXAM:  VITAL SIGNS: ED Triage Vitals  Enc Vitals Group     BP  04/01/21 1444 (!) 144/74     Pulse Rate 04/01/21 1444 68     Resp 04/01/21 1444 16     Temp 04/01/21 1444 97.8 F (36.6 C)     Temp Source 04/01/21 1444 Oral     SpO2 04/01/21 1444 99 %     Weight 04/01/21 1442 200 lb (90.7 kg)     Height 04/01/21 1442 5\' 10"  (1.778 m)     Head Circumference --      Peak Flow --      Pain Score --      Pain Loc --      Pain Edu? --      Excl. in GC? --     Constitutional: Patient is awake alert oriented to person and place but not time  states 2012 is the year. Eyes: Normal exam ENT      Head: Patient has swelling around the face specially the right side of face consistent with being down for prolonged time on the right side of his face or front of his face.      Nose: Moderate rhinorrhea      Mouth/Throat: Dry mucous membranes Cardiovascular: Normal rate, regular rhythm Respiratory: Normal respiratory effort without tachypnea nor retractions. Breath sounds are clear no obvious wheeze rales or rhonchi Gastrointestinal: Soft and nontender. No distention.   Musculoskeletal: Good range of motion all extremities with no pain elicited. Neurologic:  Normal speech and language.  Moves all extremities well.  Follows commands well. Skin:  Skin is warm Psychiatric: Mild confusion but mood and affect are normal.  ____________________________________________    EKG  EKG viewed and interpreted by myself shows what appears to be sinus rhythm at 70 bpm with a narrow QRS, normal axis, normal intervals, no concerning ST changes.  Occasional PVC.  ____________________________________________    RADIOLOGY  CT scan head is negative for acute abnormality.   CT scan of the C-spine is negative for acute abnormality. CT scan of the face shows deformity of lateral walls of both orbits possibly postsurgical changes or changes from old fractures, less likely to be acute but reinjury is not ruled out. ____________________________________________   INITIAL IMPRESSION / ASSESSMENT AND PLAN / ED COURSE  Pertinent labs & imaging results that were available during my care of the patient were reviewed by me and considered in my medical decision making (see chart for details).   Patient presents to the emergency department after being found down at his nursing facility.  Patient has mild confusion.  Patient was covered in stool and urine upon arrival.  Appears to have been down for prolonged time.  Has deformities of the face consistent with edema  and prolonged downtime.  Patient does not recall falling.  Patient's labs have resulted showing COVID-positive status.  Lab work shows elevated CK consistent with rhabdomyolysis likely from prolonged downtime.  No other significant findings on lab work.  We will begin IV hydration for his rhabdomyolysis.  I have ordered IV remdesivir for the patient given his COVID-positive status confusion.  We will admit to the hospital service for further work-up and treatment.  Lucky Trotta was evaluated in Emergency Department on 04/01/2021 for the symptoms described in the history of present illness. He was evaluated in the context of the global COVID-19 pandemic, which necessitated consideration that the patient might be at risk for infection with the SARS-CoV-2 virus that causes COVID-19. Institutional protocols and algorithms that pertain to the evaluation of patients at risk for COVID-19  are in a state of rapid change based on information released by regulatory bodies including the CDC and federal and state organizations. These policies and algorithms were followed during the patient's care in the ED.  ____________________________________________   FINAL CLINICAL IMPRESSION(S) / ED DIAGNOSES  COVID-19 Rhabdomyolysis Altered mental status   Minna Antis, MD 04/01/21 1650

## 2021-04-01 NOTE — H&P (Addendum)
[facility] [place] [facility]   PATIENT NAME: Charles Wilkinson    MR#:  [id number]  DATE OF BIRTH:  08-02-1945  DATE OF ADMISSION:  04/01/2021  PRIMARY CARE PHYSICIAN: Dr. Clydie Braun  REQUESTING/REFERRING PHYSICIAN: Dr Rito Ehrlich  CHIEF COMPLAINT:   Chief Complaint  Patient presents with   Fall   Altered Mental Status    HISTORY OF PRESENT ILLNESS:  Charles Wilkinson  is a 75 y.o. male with a known history of type 2 diabetes mellitus, hypertension, hypothyroidism and hyperlipidemia and GERD presents to the hospital after a fall.  He does not recall falling.  He does not recall what happened.  He was found down and brought into the hospital.  Bruising on his face.  He currently feels okay.  He was found to be in rhabdomyolysis and COVID-19 positive.  Patient with slight cough.  No shortness of breath.  No pain in his muscles or face.  Patient answers a few questions but falls asleep easily.  Not the best historian today.  Patient's daughter requested transfer to [facility].   PAST MEDICAL HISTORY:   Past Medical History:  Diagnosis Date   Diabetes mellitus without complication (HCC)    GERD (gastroesophageal reflux disease)    Hyperlipidemia    Hypertension    Hypothyroidism    NASH (nonalcoholic steatohepatitis)     PAST SURGICAL HISTORY:   Past Surgical History:  Procedure Laterality Date   APPENDECTOMY     HERNIA REPAIR      SOCIAL HISTORY:   Social History   Tobacco Use   Smoking status: Former    Types: Cigarettes   Smokeless tobacco: Never  Substance Use Topics   Alcohol use: Not Currently    FAMILY HISTORY:   Family History  Problem Relation Age of Onset   CAD Mother    Diabetes Mother     DRUG ALLERGIES:  Not on File  REVIEW OF SYSTEMS:  CONSTITUTIONAL: No fever.positive for fatigue.  Some weight gain EYES: No blurred or double vision.  EARS, NOSE, AND THROAT: No tinnitus or ear pain.  Positive for sore throat  and difficulty swallowing RESPIRATORY: Occasional cough, no shortness of breath, wheezing or hemoptysis.  CARDIOVASCULAR: No chest pain.  GASTROINTESTINAL: No nausea, vomiting, diarrhea or abdominal pain. No blood in bowel movements GENITOURINARY: No dysuria, hematuria.  ENDOCRINE: No polyuria, nocturia,  HEMATOLOGY: No anemia, easy bruising or bleeding SKIN: No rash or lesion. MUSCULOSKELETAL: No joint pain or muscle pain.   NEUROLOGIC: No tingling, numbness, weakness.  PSYCHIATRY: No anxiety or depression.   MEDICATIONS AT HOME:   Prior to Admission medications   Medication Sig Start Date End Date Taking? Authorizing Provider  ascorbic acid (VITAMIN C) 1000 MG tablet Take 1 tablet by mouth daily.   Yes [provider]  Cholecalciferol 50 MCG (2000 UT) CAPS Take 2,000 Units by mouth daily.   Yes [provider]  glipiZIDE (GLUCOTROL XL) 2.5 MG 24 hr tablet Take 2.5 mg by mouth daily. 02/01/21  Yes [provider]  hydrochlorothiazide (HYDRODIURIL) 12.5 MG tablet Take 12.5 mg by mouth daily. 02/01/21  Yes [provider]  insulin aspart (NOVOLOG) 100 UNIT/ML FlexPen Inject 2 Units into the skin in the morning, at noon, and at bedtime. 02/01/21 02/01/22 Yes [provider]  insulin glargine (LANTUS) 100 UNIT/ML injection Inject 5 Units into the skin at bedtime. 02/01/21 02/01/22 Yes [provider]  Lecithin 1200 MG CAPS Take 1 capsule by mouth daily.  Yes [provider]  levothyroxine (SYNTHROID) 175 MCG tablet Take 175 mcg by mouth daily. 02/01/21  Yes [provider]  lisinopril (ZESTRIL) 40 MG tablet Take 40 mg by mouth daily. 02/01/21  Yes [provider]  lisinopril (ZESTRIL) 40 MG tablet Take 1 tablet by mouth daily. 02/01/21 02/01/22 Yes [provider]  metFORMIN (GLUCOPHAGE) 500 MG tablet Take 500 mg by mouth 2 (two) times daily. 03/18/21  Yes [provider]  Multiple Vitamin  (MULTI-VITAMIN) tablet Take 1 tablet by mouth daily.   Yes [provider]  nadolol (CORGARD) 40 MG tablet Take 40 mg by mouth daily. 02/01/21  Yes [provider]  Omega-3 Fatty Acids (OMEGA-3 FISH OIL) 1200 MG CAPS Take 1 capsule by mouth daily.   Yes [provider]  pantoprazole (PROTONIX) 40 MG tablet Take 40 mg by mouth 2 (two) times daily. 01/02/21  Yes [provider]  simvastatin (ZOCOR) 10 MG tablet Take 10 mg by mouth at bedtime. 02/01/21  Yes [provider]  spironolactone (ALDACTONE) 50 MG tablet Take 50 mg by mouth daily. 03/22/21  Yes [provider]  Vitamin E 268 MG (400 UNIT) CAPS Take 1 tablet by mouth daily.   Yes [provider]  sucralfate (CARAFATE) 1 GM/10ML suspension Take 5 mLs by mouth 4 (four) times daily -  before meals and at bedtime. Patient not taking: Reported on 04/01/2021 02/01/21 02/01/22  [provider]      VITAL SIGNS:  Blood pressure 134/74, pulse 66, temperature 97.8 F (36.6 C), temperature source Oral, resp. rate 13, height 5\' 10"  (1.778 m), weight 90.7 kg, SpO2 100 %.  PHYSICAL EXAMINATION:  GENERAL:  75 y.o.-year-old patient lying in the bed with no acute distress.  EYES: Pupils equal, round, reactive to light and accommodation. No scleral icterus. Extraocular muscles intact.  Swelling right eyelid more than left eyelids. HEENT: normocephalic. Oropharynx and nasopharynx clear.  Tympanic membrane no erythema NECK:  Supple, no jugular venous distention. No thyroid enlargement, no tenderness.  LUNGS: Normal breath sounds bilaterally, no wheezing, rales,rhonchi or crepitation. No use of accessory muscles of respiration.  CARDIOVASCULAR: S1, S2 normal. No murmurs, rubs, or gallops.  ABDOMEN: Soft, nontender, nondistended. Bowel sounds present. No organomegaly or mass.  EXTREMITIES: No pedal edema, cyanosis, or clubbing.  NEUROLOGIC: Cranial nerves II through XII are intact. Muscle  strength 4/5 in all extremities. Sensation intact. Gait not checked.  PSYCHIATRIC: The patient is alert and answers some questions appropriately and falls asleep during some questions.Marland Kitchen  SKIN: Bruising left side of the face, left eyelid, nose.  Scrape on right side of the face  LABORATORY PANEL:   CBC Recent Labs  Lab 04/01/21 1447  WBC 7.5  HGB 12.1*  HCT 35.3*  PLT 90*   ------------------------------------------------------------------------------------------------------------------  Chemistries  Recent Labs  Lab 04/01/21 1447 04/01/21 1453  NA 133*  --   K 3.5  --   CL 104  --   CO2 20*  --   GLUCOSE 210*  --   BUN 36*  --   CREATININE 1.35*  --   CALCIUM 8.7*  --   AST  --  93*  ALT  --  38  ALKPHOS  --  69  BILITOT  --  1.8*   ------------------------------------------------------------------------------------------------------------------    RADIOLOGY:  CT HEAD WO CONTRAST (5MM)  Result Date: 04/01/2021 CLINICAL DATA:  Mental status changes.  Unwitnessed fall. EXAM: CT HEAD WITHOUT CONTRAST TECHNIQUE: Contiguous axial images were obtained  from the base of the skull through the vertex without intravenous contrast. COMPARISON:  None. FINDINGS: Brain: There is atrophy and chronic small vessel disease changes. No acute intracranial abnormality. Specifically, no hemorrhage, hydrocephalus, mass lesion, acute infarction, or significant intracranial injury. Vascular: No hyperdense vessel or unexpected calcification. Skull: No acute calvarial abnormality. Sinuses/Orbits: Mucosal thickening throughout paranasal sinuses. No acute findings Other: None IMPRESSION: Atrophy, chronic microvascular disease. No acute intracranial abnormality. Chronic sinusitis. Electronically Signed   By: Rolm Baptise M.D.   On: 04/01/2021 15:31   CT Cervical Spine Wo Contrast  Result Date: 04/01/2021 CLINICAL DATA:  Trauma EXAM: CT CERVICAL SPINE WITHOUT CONTRAST TECHNIQUE: Multidetector CT  imaging of the cervical spine was performed without intravenous contrast. Multiplanar CT image reconstructions were also generated. COMPARISON:  None. FINDINGS: Alignment: There is minimal 2-3 mm retrolisthesis at C3-C4 level which may be residual from previous ligament injury and facet degeneration. Skull base and vertebrae: No recent fracture is seen. Degenerative changes are noted with disc space narrowing, bony spurs and facet hypertrophy at multiple levels. Soft tissues and spinal canal: There is extrinsic pressure over the ventral margin of thecal sac caused by posterior bony spurs at multiple levels with mild spinal stenosis, particularly at C3-C4, C5-C6 and C6-C7 levels. There is encroachment of neural foramina at C3-C4 level, more so on the right side. There is mild encroachment of neural foramina at C4-C5 level. There is mild to moderate encroachment of neural foramina at C5-C6 level, more so on the left side. There is moderate encroachment of neural foramina at C6-C7 and C7-T1 levels. Disc levels:  As described above Upper chest: Unremarkable. Other: Thyroid appears smaller than usual in size. Arterial calcifications are noted in the common carotid arteries and common carotid bifurcations. There is mild edema in the subcutaneous plane in the anterior neck. There are subcentimeter nodes in both sides of neck. IMPRESSION: No recent fracture is seen in the cervical spine. Cervical spondylosis with spinal stenosis and encroachment of neural foramina at multiple levels as described in the body of the report. Minimal retrolisthesis at C3-C4 level may be residual from previous ligament injury and facet degeneration. Electronically Signed   By: Elmer Picker M.D.   On: 04/01/2021 15:47   DG Chest Portable 1 View  Result Date: 04/01/2021 CLINICAL DATA:  Altered mental status, COVID EXAM: PORTABLE CHEST 1 VIEW COMPARISON:  None. FINDINGS: Low lung volumes, bibasilar atelectasis. Heart is normal size.  Aortic calcifications. No effusions or acute bony abnormality. IMPRESSION: Low volumes, bibasilar atelectasis. Aortic atherosclerosis. Electronically Signed   By: Rolm Baptise M.D.   On: 04/01/2021 16:46   CT Maxillofacial Wo Contrast  Result Date: 04/01/2021 CLINICAL DATA:  Trauma EXAM: CT MAXILLOFACIAL WITHOUT CONTRAST TECHNIQUE: Multidetector CT imaging of the maxillofacial structures was performed. Multiplanar CT image reconstructions were also generated. COMPARISON:  None. FINDINGS: Osseous: There is possible old blowout fracture in the medial wall of left orbit. There is deformity in the the lateral walls of both orbits along with metallic densities suggesting previous internal fixation. There is possible old blowout fractures along the floors of both orbits, more so on the left side. There is deformity in the posterolateral wall of right maxillary sinus, possibly residual from previous injury. Possibility of re-injury at the site of old fractures is not excluded. Orbits: Optic globes are symmetrical. Retrobulbar soft tissues are unremarkable. Sinuses: There is mild mucosal thickening in the ethmoid sinus. There are no air-fluid levels in the paranasal sinuses. There is mucosal  thickening in both maxillary sinuses, more so along the posterior right maxillary sinus. Soft tissues: There is soft tissue swelling in the subcutaneous plane in the frontal scalp and periorbital regions, more so on the right side. Limited intracranial: Unremarkable. IMPRESSION: There is deformity in the lateral walls of both orbits medial wall of left orbit and along the floors of both orbits. There is deformity in the both maxillary sinuses. Possible postsurgical changes are noted in the lateral walls of both orbits. Findings most likely suggest residual deformities from old fractures. Please correlate with clinical history. Possibility of re-injury at the site of old fractures is not excluded. No focal abnormality is seen in the  optic globes and retrobulbar soft tissues. There are no air-fluid levels in the paranasal sinuses. Electronically Signed   By: Elmer Picker M.D.   On: 04/01/2021 15:41    EKG:   EKG reviewed by me,  lots of artifact.  70 bpm.  PVCs  IMPRESSION AND PLAN:   COVID-19 encephalopathy.  ER physician started remdesivir.  We will continue 3 to 5-day course depending on course.  No indication for steroids at this time.  Albuterol inhaler.  Zinc and vitamin C prescribed. Acute rhabdomyolysis.  Hold simvastatin.  IV fluid hydration.  Check CPK again in the morning and monitor creatinine. Fall versus syncope.  Monitor on telemetry.  Echocardiogram.  Check orthostatics. Maxillofacial CT scan shows deformity in the lateral walls of both orbits medial wall of left orbit and along the floors of both orbits.  Radiologist described them as residual deformities from old fractures.  No focal abnormality seen in the optic globes and retrobulbar soft tissues.  History of breast cancer with the swelling around the right eyelid and bruising on the face I will have ENT review the CT scan and evaluate.  Case discussed with Dr. Rockne Menghini CKD stage IIIa.  Baseline creatinine around 1.2. Type 2 diabetes mellitus with hyperlipidemia.  Hold simvastatin.  Start sliding scale insulin.  Hold oral medications.  Patient on low-dose Lantus at night. Essential hypertension.  Hold hydrochlorothiazide.  Continue spironolactone, nadolol and lisinopril. Karlene Lineman.  Continue nadolol Hypothyroidism unspecified on levothyroxine GERD on Protonix Physical therapy evaluation  All the records, laboratory and radiological data are reviewed and case discussed with ED provider. Case discussed with nursing staff. Management plans discussed with the patient, family and they requested transfer to [facility].  Called to the transfer line.  All family request need to be reviewed by the medical director.  They are currently tight on beds.   Awaiting callback for review.  CODE STATUS: Full code  TOTAL TIME TAKING CARE OF THIS PATIENT: 60 minutes.    Loletha Grayer M.D on 04/01/2021 at 6:06 PM    [facility]  CC: Primary care physician; Dr. Adrian Prows  Addendum.  Received a call back from [facility] transfer center and patient not excepted for transfer at this time.  Patient will be admitted to [facility].

## 2021-04-01 NOTE — ED Triage Notes (Signed)
Pt to ED [facility] from [facility] where lives alone. [facility] was called for unwitnessed fall at unknown time. Pt with redness to bilateral knees. Swelling to right side of face. Oriented to person only.  Pt cleaned from saturation in urine and feces

## 2021-04-01 NOTE — Consult Note (Signed)
84M admitted via ED for AMS and fall. Found to be COVID+. Presented with abrasions and swelling of face. Reports vision is normal--no double vision, can found fingers easily. No numbness of face.  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  On exam, there is soft tissue erythema and mild-moderate swelling of face.  EOMI Conjunctiva normal Mild tenderness, no step-offs along rims Nasal cavity unremarkable Oral cavity unremarkable V1,2,3 intact bilaterally Can count fingers OD and OS separately Neck: no lymphadenopathy   CT scan maxillofacial:   Reveals old fracture lines along bilateral lateral orbital rims, bilateral orbital floors, medial orbital wall of left. Wire fixation of right lateral orbital rim. Incidentally, cyst likely odontogenic of the right maxillary sinus floor and posterior wall   Consistent with old fractures based on history and exam. Even if fractures are new, these would be nonoperative given minimal displacement.   Recommend conservative management for soft tissue swelling and skin abrasion.

## 2021-04-01 NOTE — Progress Notes (Signed)
Remdesivir - Pharmacy Brief Note   O:  ALT: 38 CXR: pending SpO2: 97% on RA   A/P:  Remdesivir 200 mg IVPB once followed by 100 mg IVPB daily x 4 days.   Raiford Noble, PharmD Clinical Pharmacist  04/01/2021 4:39 PM

## 2021-04-01 NOTE — ED Notes (Signed)
Pt provided ice water as requested, ok per Dr Lenard Lance

## 2021-04-01 NOTE — ED Notes (Signed)
Daughter updated to the best of this RN ability.   Requests call when pt moved to floor  [place] [phone]

## 2021-04-01 NOTE — ED Provider Notes (Signed)
_________________________ 11:38 PM on 04/01/2021 ----------------------------------------- Patient admitted boarding in the emergency room.  I was asked to come evaluate patient after he fell trying to get out of bed on his own.  Patient sustained a laceration of his scalp which was repaired per procedure note below.  Patient mentating well.  Complaining of a mild headache since the fall.  Patient was placed on a c-collar and sent for imaging.  CT head and cervical spine negative for traumatic injury.  X-ray of the right shoulder, chest x-ray, pelvis x-ray, and right hip x-ray were all negative for any traumatic injuries.  Patient was given Tylenol for pain.  Hospitalist service was informed. Staples need to be removed in 7 days. Tetanus booster administered.   LACERATION REPAIR Performed by: Nita Sickle Authorized by: Nita Sickle Consent: Verbal consent obtained. Risks and benefits: risks, benefits and alternatives were discussed Consent given by: patient Patient identity confirmed: provided demographic data Prepped and Draped in normal sterile fashion Wound explored  Laceration Location: scalp  Laceration Length: 4 cm  No Foreign Bodies seen or palpated  Anesthesia: local infiltration  Local anesthetic: none  Irrigation method: syringe Amount of cleaning: standard  Skin closure: staples  Number of staples: 3   Patient tolerance: Patient tolerated the procedure well with no immediate complications.    I have personally reviewed the images performed during this visit and I agree with the Radiologist's read.   Interpretation by Radiologist:  DG Chest 2 View  Result Date: 04/02/2021 CLINICAL DATA:  Recent falls with chest pain, initial encounter EXAM: CHEST - 2 VIEW COMPARISON:  Film from earlier in the same day. FINDINGS: Cardiac shadow is within normal limits. Aortic calcifications are noted. Lungs are clear bilaterally. Bony abnormality is seen. IMPRESSION:  No active cardiopulmonary disease. Electronically Signed   By: Alcide Clever M.D.   On: 04/02/2021 00:28   DG Shoulder Right  Result Date: 04/02/2021 CLINICAL DATA:  Recent fall with right shoulder pain, initial encounter EXAM: RIGHT SHOULDER - 2+ VIEW COMPARISON:  None. FINDINGS: Humeral head is well seated. Degenerative changes of the acromioclavicular joint are noted. No acute fracture or dislocation is seen. Underlying bony thorax appears within limits. IMPRESSION: Degenerative change without acute abnormality. Electronically Signed   By: Alcide Clever M.D.   On: 04/02/2021 00:28   CT Head Wo Contrast  Result Date: 04/01/2021 CLINICAL DATA:  Recurrent falls today EXAM: CT HEAD WITHOUT CONTRAST CT CERVICAL SPINE WITHOUT CONTRAST TECHNIQUE: Multidetector CT imaging of the head and cervical spine was performed following the standard protocol without intravenous contrast. Multiplanar CT image reconstructions of the cervical spine were also generated. COMPARISON:  CT from earlier in the same day. FINDINGS: CT HEAD FINDINGS Brain: No evidence of acute infarction, hemorrhage, hydrocephalus, extra-axial collection or mass lesion/mass effect. Chronic atrophic and ischemic changes are noted. Vascular: No hyperdense vessel or unexpected calcification. Skull: Normal. Negative for fracture or focal lesion. Sinuses/Orbits: Postsurgical changes are noted along the lateral aspect of the orbits bilaterally. Other: Surgical staples are noted in the right forehead consistent with the recent fall and laceration. CT CERVICAL SPINE FINDINGS Alignment: Stable retrolisthesis of C3 on C4 is noted. Skull base and vertebrae: 7 cervical segments are well visualized. Vertebral body height is well maintained. Multilevel osteophytic changes and disc space narrowing is seen. Facet hypertrophic changes are noted. No acute fracture or acute facet abnormality is noted. Soft tissues and spinal canal: Atherosclerotic calcifications of the  carotid arteries are seen. No acute soft  tissue abnormality is noted. Upper chest: Visualized lung apices are within normal limits. Other: None IMPRESSION: CT of the head: Right scalp laceration with surgical staples in place. Chronic atrophic and ischemic changes. No acute intracranial abnormality is noted. CT of the cervical spine: Multilevel degenerative changes similar to that seen on the exam from earlier in the same day. No acute fracture is noted. Electronically Signed   By: Alcide Clever M.D.   On: 04/01/2021 23:56   CT HEAD WO CONTRAST ( )  Result Date: 04/01/2021 CLINICAL DATA:  Mental status changes.  Unwitnessed fall. EXAM: CT HEAD WITHOUT CONTRAST TECHNIQUE: Contiguous axial images were obtained from the base of the skull through the vertex without intravenous contrast. COMPARISON:  None. FINDINGS: Brain: There is atrophy and chronic small vessel disease changes. No acute intracranial abnormality. Specifically, no hemorrhage, hydrocephalus, mass lesion, acute infarction, or significant intracranial injury. Vascular: No hyperdense vessel or unexpected calcification. Skull: No acute calvarial abnormality. Sinuses/Orbits: Mucosal thickening throughout paranasal sinuses. No acute findings Other: None IMPRESSION: Atrophy, chronic microvascular disease. No acute intracranial abnormality. Chronic sinusitis. Electronically Signed   By: Charlett Nose M.D.   On: 04/01/2021 15:31   CT Cervical Spine Wo Contrast  Result Date: 04/01/2021 CLINICAL DATA:  Recurrent falls today EXAM: CT HEAD WITHOUT CONTRAST CT CERVICAL SPINE WITHOUT CONTRAST TECHNIQUE: Multidetector CT imaging of the head and cervical spine was performed following the standard protocol without intravenous contrast. Multiplanar CT image reconstructions of the cervical spine were also generated. COMPARISON:  CT from earlier in the same day. FINDINGS: CT HEAD FINDINGS Brain: No evidence of acute infarction, hemorrhage, hydrocephalus,  extra-axial collection or mass lesion/mass effect. Chronic atrophic and ischemic changes are noted. Vascular: No hyperdense vessel or unexpected calcification. Skull: Normal. Negative for fracture or focal lesion. Sinuses/Orbits: Postsurgical changes are noted along the lateral aspect of the orbits bilaterally. Other: Surgical staples are noted in the right forehead consistent with the recent fall and laceration. CT CERVICAL SPINE FINDINGS Alignment: Stable retrolisthesis of C3 on C4 is noted. Skull base and vertebrae: 7 cervical segments are well visualized. Vertebral body height is well maintained. Multilevel osteophytic changes and disc space narrowing is seen. Facet hypertrophic changes are noted. No acute fracture or acute facet abnormality is noted. Soft tissues and spinal canal: Atherosclerotic calcifications of the carotid arteries are seen. No acute soft tissue abnormality is noted. Upper chest: Visualized lung apices are within normal limits. Other: None IMPRESSION: CT of the head: Right scalp laceration with surgical staples in place. Chronic atrophic and ischemic changes. No acute intracranial abnormality is noted. CT of the cervical spine: Multilevel degenerative changes similar to that seen on the exam from earlier in the same day. No acute fracture is noted. Electronically Signed   By: Alcide Clever M.D.   On: 04/01/2021 23:56   CT Cervical Spine Wo Contrast  Result Date: 04/01/2021 CLINICAL DATA:  Trauma EXAM: CT CERVICAL SPINE WITHOUT CONTRAST TECHNIQUE: Multidetector CT imaging of the cervical spine was performed without intravenous contrast. Multiplanar CT image reconstructions were also generated. COMPARISON:  None. FINDINGS: Alignment: There is minimal 2-3 mm retrolisthesis at C3-C4 level which may be residual from previous ligament injury and facet degeneration. Skull base and vertebrae: No recent fracture is seen. Degenerative changes are noted with disc space narrowing, bony spurs and  facet hypertrophy at multiple levels. Soft tissues and spinal canal: There is extrinsic pressure over the ventral margin of thecal sac caused by posterior bony spurs at multiple levels with  mild spinal stenosis, particularly at C3-C4, C5-C6 and C6-C7 levels. There is encroachment of neural foramina at C3-C4 level, more so on the right side. There is mild encroachment of neural foramina at C4-C5 level. There is mild to moderate encroachment of neural foramina at C5-C6 level, more so on the left side. There is moderate encroachment of neural foramina at C6-C7 and C7-T1 levels. Disc levels:  As described above Upper chest: Unremarkable. Other: Thyroid appears smaller than usual in size. Arterial calcifications are noted in the common carotid arteries and common carotid bifurcations. There is mild edema in the subcutaneous plane in the anterior neck. There are subcentimeter nodes in both sides of neck. IMPRESSION: No recent fracture is seen in the cervical spine. Cervical spondylosis with spinal stenosis and encroachment of neural foramina at multiple levels as described in the body of the report. Minimal retrolisthesis at C3-C4 level may be residual from previous ligament injury and facet degeneration. Electronically Signed   By: Ernie Avena M.D.   On: 04/01/2021 15:47   DG Chest Portable 1 View  Result Date: 04/01/2021 CLINICAL DATA:  Altered mental status, COVID EXAM: PORTABLE CHEST 1 VIEW COMPARISON:  None. FINDINGS: Low lung volumes, bibasilar atelectasis. Heart is normal size. Aortic calcifications. No effusions or acute bony abnormality. IMPRESSION: Low volumes, bibasilar atelectasis. Aortic atherosclerosis. Electronically Signed   By: Charlett Nose M.D.   On: 04/01/2021 16:46   DG Hip Unilat W or Wo Pelvis 2-3 Views Right  Result Date: 04/02/2021 CLINICAL DATA:  Recent fall with hip pain, initial encounter EXAM: DG HIP (WITH OR WITHOUT PELVIS) 3V RIGHT COMPARISON:  None. FINDINGS: Pelvic ring is  intact. Degenerative changes of lumbar spine are seen. No acute fracture or dislocation is noted. No soft tissue abnormality is seen. IMPRESSION: No acute abnormality noted. Electronically Signed   By: Alcide Clever M.D.   On: 04/02/2021 00:29   CT Maxillofacial Wo Contrast  Result Date: 04/01/2021 CLINICAL DATA:  Trauma EXAM: CT MAXILLOFACIAL WITHOUT CONTRAST TECHNIQUE: Multidetector CT imaging of the maxillofacial structures was performed. Multiplanar CT image reconstructions were also generated. COMPARISON:  None. FINDINGS: Osseous: There is possible old blowout fracture in the medial wall of left orbit. There is deformity in the the lateral walls of both orbits along with metallic densities suggesting previous internal fixation. There is possible old blowout fractures along the floors of both orbits, more so on the left side. There is deformity in the posterolateral wall of right maxillary sinus, possibly residual from previous injury. Possibility of re-injury at the site of old fractures is not excluded. Orbits: Optic globes are symmetrical. Retrobulbar soft tissues are unremarkable. Sinuses: There is mild mucosal thickening in the ethmoid sinus. There are no air-fluid levels in the paranasal sinuses. There is mucosal thickening in both maxillary sinuses, more so along the posterior right maxillary sinus. Soft tissues: There is soft tissue swelling in the subcutaneous plane in the frontal scalp and periorbital regions, more so on the right side. Limited intracranial: Unremarkable. IMPRESSION: There is deformity in the lateral walls of both orbits medial wall of left orbit and along the floors of both orbits. There is deformity in the both maxillary sinuses. Possible postsurgical changes are noted in the lateral walls of both orbits. Findings most likely suggest residual deformities from old fractures. Please correlate with clinical history. Possibility of re-injury at the site of old fractures is not  excluded. No focal abnormality is seen in the optic globes and retrobulbar soft tissues. There are  no air-fluid levels in the paranasal sinuses. Electronically Signed   By: Ernie Avena M.D.   On: 04/01/2021 15:41      Nita Sickle, MD 04/02/21 [phone]

## 2021-04-01 NOTE — ED Notes (Signed)
Patient in CT at this time.

## 2021-04-01 NOTE — Progress Notes (Signed)
Notified in regards to the patient falling out of the bed at approximately 23:11.  Patient has been evaluated by ED provider and staples to close scalp laceration sustained during fall.  CT scan of the head and cervical spine were ordered but did not note any acute intracranial abnormality or fracture.

## 2021-04-02 ENCOUNTER — Inpatient Hospital Stay
Admit: 2021-04-02 | Discharge: 2021-04-02 | Disposition: A | Discharge status: Home / Self Care | Payer: Medicare Other | Attending: Internal Medicine | Admitting: Internal Medicine

## 2021-04-02 ENCOUNTER — Inpatient Hospital Stay: Payer: Medicare Other

## 2021-04-02 DIAGNOSIS — D696 Thrombocytopenia, unspecified: Secondary | ICD-10-CM

## 2021-04-02 DIAGNOSIS — N1831 Chronic kidney disease, stage 3a: Secondary | ICD-10-CM | POA: Diagnosis not present

## 2021-04-02 DIAGNOSIS — M25562 Pain in left knee: Secondary | ICD-10-CM

## 2021-04-02 DIAGNOSIS — T796XXD Traumatic ischemia of muscle, subsequent encounter: Secondary | ICD-10-CM | POA: Diagnosis not present

## 2021-04-02 DIAGNOSIS — U071 COVID-19: Secondary | ICD-10-CM | POA: Diagnosis not present

## 2021-04-02 DIAGNOSIS — W19XXXS Unspecified fall, sequela: Secondary | ICD-10-CM | POA: Diagnosis not present

## 2021-04-02 LAB — CBC WITH DIFFERENTIAL/PLATELET
Abs Immature Granulocytes: 0.04 10*3/uL (ref 0.00–0.07)
Basophils Absolute: 0 10*3/uL (ref 0.0–0.1)
Basophils Relative: 0 %
Eosinophils Absolute: 0 10*3/uL (ref 0.0–0.5)
Eosinophils Relative: 0 %
HCT: 30.5 % — ABNORMAL LOW (ref 39.0–52.0)
Hemoglobin: 10.2 g/dL — ABNORMAL LOW (ref 13.0–17.0)
Immature Granulocytes: 1 %
Lymphocytes Relative: 13 %
Lymphs Abs: 1 10*3/uL (ref 0.7–4.0)
MCH: 30.3 pg (ref 26.0–34.0)
MCHC: 33.4 g/dL (ref 30.0–36.0)
MCV: 90.5 fL (ref 80.0–100.0)
Monocytes Absolute: 0.7 10*3/uL (ref 0.1–1.0)
Monocytes Relative: 8 %
Neutro Abs: 6.2 10*3/uL (ref 1.7–7.7)
Neutrophils Relative %: 78 %
Platelets: 89 10*3/uL — ABNORMAL LOW (ref 150–400)
RBC: 3.37 MIL/uL — ABNORMAL LOW (ref 4.22–5.81)
RDW: 15.4 % (ref 11.5–15.5)
WBC: 8 10*3/uL (ref 4.0–10.5)
nRBC: 0 % (ref 0.0–0.2)

## 2021-04-02 LAB — COMPREHENSIVE METABOLIC PANEL
ALT: 42 U/L (ref 0–44)
AST: 104 U/L — ABNORMAL HIGH (ref 15–41)
Albumin: 2.6 g/dL — ABNORMAL LOW (ref 3.5–5.0)
Alkaline Phosphatase: 59 U/L (ref 38–126)
Anion gap: 8 (ref 5–15)
BUN: 46 mg/dL — ABNORMAL HIGH (ref 8–23)
CO2: 23 mmol/L (ref 22–32)
Calcium: 8.1 mg/dL — ABNORMAL LOW (ref 8.9–10.3)
Chloride: 106 mmol/L (ref 98–111)
Creatinine, Ser: 1.48 mg/dL — ABNORMAL HIGH (ref 0.61–1.24)
GFR, Estimated: 49 mL/min — ABNORMAL LOW (ref >60)
Glucose, Bld: 112 mg/dL — ABNORMAL HIGH (ref 70–99)
Potassium: 3.3 mmol/L — ABNORMAL LOW (ref 3.5–5.1)
Sodium: 137 mmol/L (ref 135–145)
Total Bilirubin: 1 mg/dL (ref 0.3–1.2)
Total Protein: 5.7 g/dL — ABNORMAL LOW (ref 6.5–8.1)

## 2021-04-02 LAB — ECHOCARDIOGRAM COMPLETE
AR max vel: 2.83 cm2
AV Area VTI: 3.84 cm2
AV Area mean vel: 2.98 cm2
AV Mean grad: 3 mmHg
AV Peak grad: 4.7 mmHg
Ao pk vel: 1.08 m/s
Area-P 1/2: 2.43 cm2
Height: 70 in
MV VTI: 3.98 cm2
S' Lateral: 3.5 cm
Weight: 3200 oz

## 2021-04-02 LAB — CK: Total CK: 2445 U/L — ABNORMAL HIGH (ref 49–397)

## 2021-04-02 LAB — URINALYSIS, COMPLETE (UACMP) WITH MICROSCOPIC
Bilirubin Urine: NEGATIVE
Glucose, UA: NEGATIVE mg/dL
Leukocytes,Ua: NEGATIVE
Nitrite: NEGATIVE
Protein, ur: >300 mg/dL — AB
Specific Gravity, Urine: >1.03 — ABNORMAL HIGH (ref 1.005–1.030)
pH: 5.5 (ref 5.0–8.0)

## 2021-04-02 LAB — C-REACTIVE PROTEIN: CRP: 8.8 mg/dL — ABNORMAL HIGH (ref <1.0)

## 2021-04-02 LAB — D-DIMER, QUANTITATIVE: D-Dimer, Quant: 2.13 ug/mL-FEU — ABNORMAL HIGH (ref 0.00–0.50)

## 2021-04-02 LAB — GLUCOSE, CAPILLARY
Glucose-Capillary: 172 mg/dL — ABNORMAL HIGH (ref 70–99)
Glucose-Capillary: 165 mg/dL — ABNORMAL HIGH (ref 70–99)
Glucose-Capillary: 158 mg/dL — ABNORMAL HIGH (ref 70–99)

## 2021-04-02 LAB — AMMONIA: Ammonia: <10 umol/L (ref 9–35)

## 2021-04-02 LAB — CBG MONITORING, ED: Glucose-Capillary: 121 mg/dL — ABNORMAL HIGH (ref 70–99)

## 2021-04-02 IMAGING — DX DG KNEE 1-2V*L*
2 series · 2 of 2 positions shown · non-contrast
Comparison: None.

CLINICAL DATA: 75-year-old male with a history of fall and pain

EXAM:
LEFT KNEE - 1-2 VIEW

[knee ap]
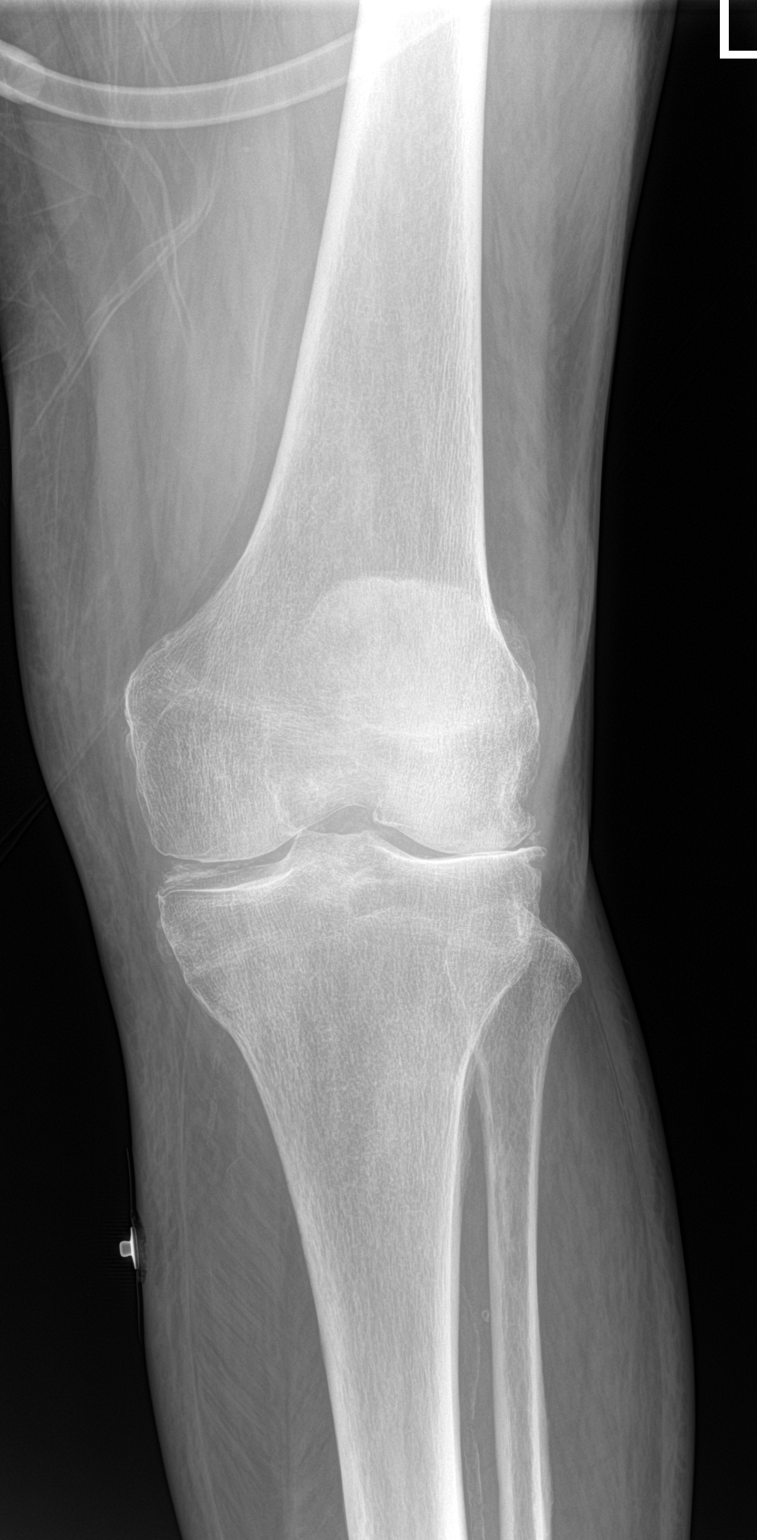

[knee lat]
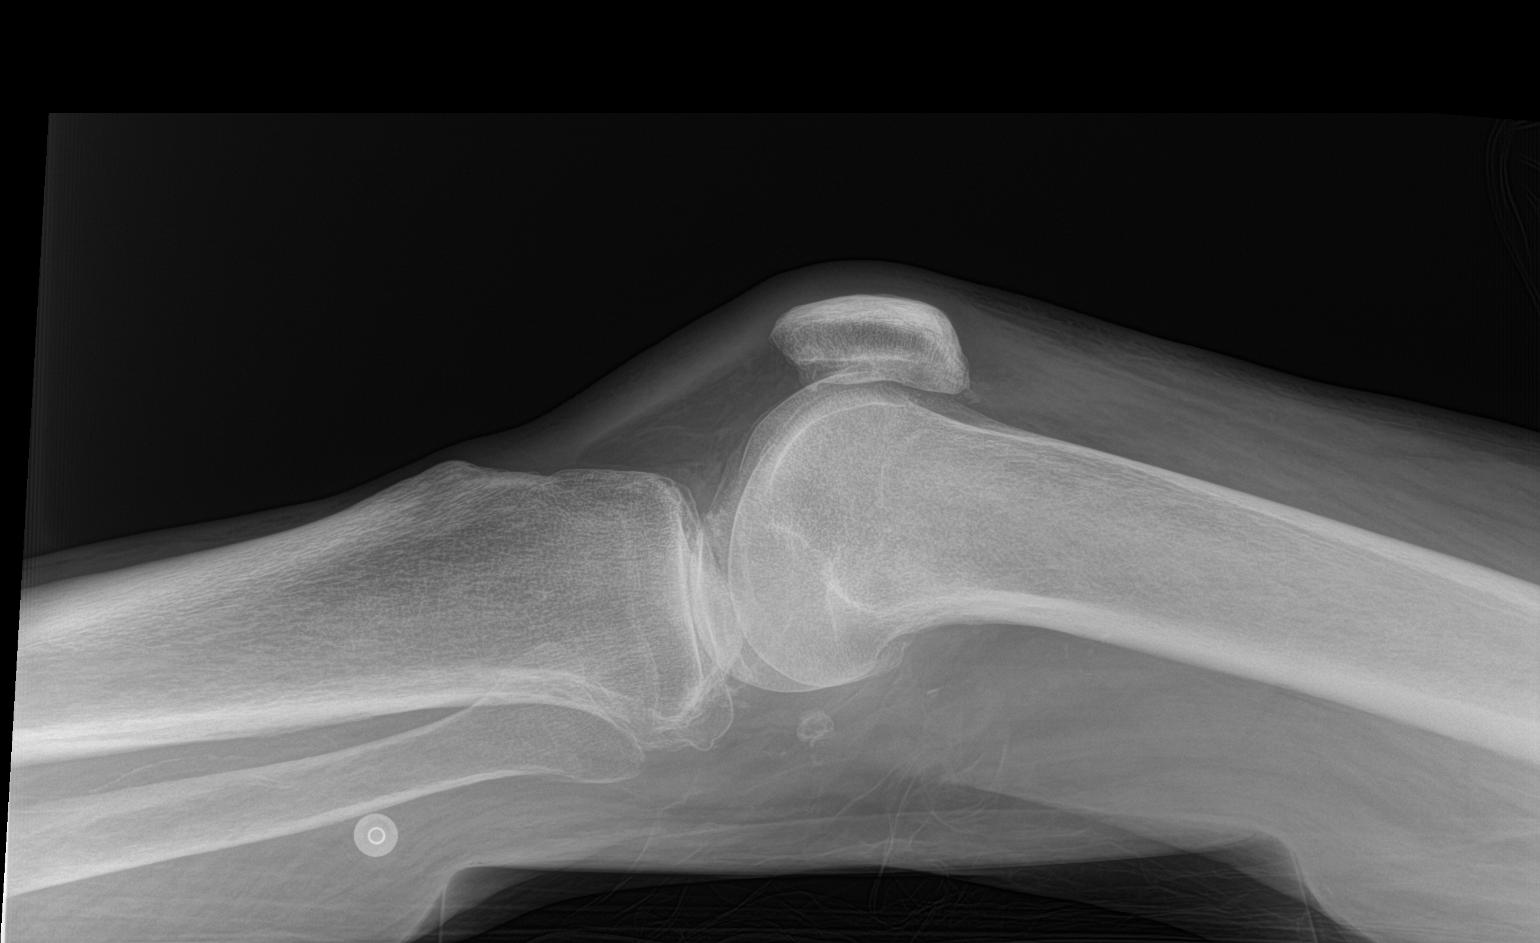

[2 of 2 positions shown; findings below may reference images not displayed]

FINDINGS: Osteopenia. Joint space narrowing of the lateral greater than medial
compartment. Degenerative changes of patellofemoral joint.

Chondrocalcinosis of the medial compartment. Bone-on-bone
articulation of the lateral compartment.

No joint effusion.  No displaced fracture.
IMPRESSION: No acute bony abnormality.

Tricompartmental osteoarthritis, worst at the lateral compartment
where there appears to be bone-on-bone articulation.

## 2021-04-02 MED ORDER — TETANUS-DIPHTH-ACELL PERTUSSIS 5-2.5-18.5 LF-MCG/0.5 IM SUSY
0.5000 mL | PREFILLED_SYRINGE | Freq: Once | INTRAMUSCULAR | Status: AC
  Administered 2021-04-02: 01:00:00 0.5 mL via INTRAMUSCULAR
  Filled 2021-04-02: qty 0.5

## 2021-04-02 MED ORDER — ACETAMINOPHEN 500 MG PO TABS
1000.0000 mg | ORAL_TABLET | Freq: Once | ORAL | Status: AC
  Administered 2021-04-02: 01:00:00 1000 mg via ORAL
  Filled 2021-04-02: qty 2

## 2021-04-02 MED ORDER — CEPHALEXIN 500 MG PO CAPS
500.0000 mg | ORAL_CAPSULE | Freq: Three times a day (TID) | ORAL | Status: AC
  Administered 2021-04-02 – 2021-04-09 (×21): 500 mg via ORAL
  Filled 2021-04-02 (×21): qty 1

## 2021-04-02 MED ORDER — POTASSIUM CHLORIDE CRYS ER 20 MEQ PO TBCR
40.0000 meq | EXTENDED_RELEASE_TABLET | Freq: Once | ORAL | Status: AC
  Administered 2021-04-02: 15:00:00 40 meq via ORAL
  Filled 2021-04-02: qty 2

## 2021-04-02 NOTE — ED Notes (Signed)
Pt provided water at this time.

## 2021-04-02 NOTE — ED Notes (Signed)
Patient placed on hospital bed with bed alarm on. Patient alert, oriented x4. Resp even, unlabored on RA. NSR on monitor at 60's. Oxygen 99% on room air. Purewick external catheter applied and placed to suction. Non-skid socks placed on patient. Pupils equal, reactive. Moves all extremities and follows commands.

## 2021-04-02 NOTE — Progress Notes (Signed)
*  PRELIMINARY RESULTS* Echocardiogram 2D Echocardiogram has been performed.  Cristela Blue 04/02/2021, 8:52 AM

## 2021-04-02 NOTE — Progress Notes (Addendum)
This Rn was in room 33 when loud thump was heard. Went next door to this patient room 34 to find patient on the floor on his right side. Patient with blood on floor coming from the area near his head. Patient able to talk to me and I asked him to lay still. Alerted Charge Rn that help was needed due to fall and patient with significant bleeding. Prior to fall patient was alert and oriented x 3 (fuzzy on the year) and had male external catheter in place and bedside monitor on. Patient was on RA. With help of other ED staff patient was lifted on board to strecher. MD at bedside. Patient transferred to room 19 for further evaluation and treatment.

## 2021-04-02 NOTE — ED Notes (Signed)
Pts family called by Clinical research associate to inform of fall as well as post medical treatment. Writer spoke with Charles Wilkinson [phone].) Miss. Charles Wilkinson aware of interventions and post fall results as well as treatment. Family also made aware that pt was moved closer to nurses station in main ED with new bed and new bed alarms in place as well as tele-sitter for 24/7 observation for safety. Miss. Charles Wilkinson provided verbal understanding and reported that she would be to ED in the AM to follow up with pt.

## 2021-04-02 NOTE — ED Notes (Signed)
Charles Wilkinson aware of assigned bed
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
## 2021-04-02 NOTE — Evaluation (Signed)
Physical Therapy Evaluation Patient Details Name: Charles Wilkinson MRN: [id number] DOB: Dec 25, 1945 Today's Date: 04/02/2021  History of Present Illness  75 y.o. male with a known history of type 2 diabetes mellitus, hypertension, hypothyroidism and hyperlipidemia and GERD presents to the hospital after a fall at independent living.  Clinical Impression  Pt pleasant t/o PT exam and eager to get up and do some activity.  He has had 2 recent falls but during today's effort he did not show any balance or overt safety issues.  He does have baseline hunched posture and slow, shuffling gait; along with mild L ankle/LE posturing issues from chronic injury.  Pt was able to show good mobility appropriate for in home and limited facility activity (walks to dining area QD).  He had some confusion on arrival but appears to be alert and oriented showing good safety awareness during this session.  Pt is not quite at baseline and and would benefit from continued PT at discharge to insure smooth transition back to independent living, also discussed possible Life Alert system and having daughter/family at least do daily calls when he initially returns home.     Recommendations for follow up therapy are one component of a multi-disciplinary discharge planning process, led by the attending physician.  Recommendations may be updated based on patient status, additional functional criteria and insurance authorization.  Follow Up Recommendations Home health PT    Assistance Recommended at Discharge PRN  Functional Status Assessment    Equipment Recommendations  Other (comment) (Life Alert)    Recommendations for Other Services       Precautions / Restrictions Precautions Precautions: Fall Restrictions Weight Bearing Restrictions: No      Mobility  Bed Mobility Overal bed mobility: Modified Independent             General bed mobility comments: Pt able to get himself to EOB w/o assist, light UE use of  rails    Transfers Overall transfer level: Modified independent Equipment used: Rolling walker (2 wheels)               General transfer comment: some extra time and cuing for hand placement and set up, but no phyiscal assist to rise to standing    Ambulation/Gait Ambulation/Gait assistance: Modified independent (Device/Increase time) Gait Distance (Feet): 50 Feet Assistive device: Rolling walker (2 wheels)         General Gait Details: Slow, stooped, AD reliant gait but with no LOBs, no changes in vitals and good overall effort/safety. He did endorse some minimal subjective fatigue and states he does not quite feel back to baseline.  Stairs            Wheelchair Mobility    Modified Rankin (Stroke Patients Only)       Balance Overall balance assessment: Modified Independent (reliant on walker but no overt safety/balance issues)                                           Pertinent Vitals/Pain Pain Assessment: No/denies pain Pain Score:  (reports h/o occasional L knee issues and chronic L knee issues)    Home Living Family/patient expects to be discharged to:: Private residence Living Arrangements: Alone Available Help at Discharge: Personal care attendant;Family (PCA 4hr 3d/wk, daughter checks in ~QW) Type of Home: Independent living facility         Home Layout: One level  Home Equipment: Agricultural consultant (2 wheels);Rollator (4 wheels);Shower seat - built in;Grab bars - toilet;Grab bars - tub/shower;Hand held shower head      Prior Function Prior Level of Function : Independent/Modified Independent             Mobility Comments: Pt states that he uses 4WW all the time, but is able to daily walk to the dining hall (prolonged hallway, elevator, etc) daily and occasionally gets out with family. ADLs Comments: PCA helps with laundry, cleaning, etc but pt can do dressing, bathing, light meal prep, etc     Hand Dominance         Extremity/Trunk Assessment   Upper Extremity Assessment Upper Extremity Assessment: Overall WFL for tasks assessed    Lower Extremity Assessment Lower Extremity Assessment: Overall WFL for tasks assessed (minimal chronic L foot/leg posturing issues that are not functionally limiting)       Communication   Communication: No difficulties  Cognition Arousal/Alertness: Awake/alert Behavior During Therapy: WFL for tasks assessed/performed Overall Cognitive Status: Within Functional Limits for tasks assessed                                 General Comments: Pt appears to be much more clear of mind at this time, able to hold appropriate conversation, A&O        General Comments      Exercises     Assessment/Plan    PT Assessment Patient needs continued PT services  PT Problem List Decreased strength;Decreased range of motion;Decreased activity tolerance;Decreased balance;Decreased mobility;Decreased knowledge of use of DME;Decreased safety awareness       PT Treatment Interventions DME instruction;Gait training;Functional mobility training;Therapeutic activities;Therapeutic exercise;Balance training;Patient/family education    PT Goals (Current goals can be found in the Care Plan section)  Acute Rehab PT Goals Patient Stated Goal: Go back to his apartment PT Goal Formulation: With patient Time For Goal Achievement: 04/16/21 Potential to Achieve Goals: Good    Frequency Min 2X/week   Barriers to discharge        Co-evaluation               AM-PAC PT "6 Clicks" Mobility  Outcome Measure Help needed turning from your back to your side while in a flat bed without using bedrails?: None Help needed moving from lying on your back to sitting on the side of a flat bed without using bedrails?: None Help needed moving to and from a bed to a chair (including a wheelchair)?: None Help needed standing up from a chair using your arms (e.g., wheelchair or  bedside chair)?: None Help needed to walk in hospital room?: A Little Help needed climbing 3-5 steps with a railing? : A Little 6 Click Score: 22    End of Session Equipment Utilized During Treatment: Gait belt Activity Tolerance: Patient tolerated treatment well Patient left: with chair alarm set;with call bell/phone within reach Nurse Communication: Mobility status PT Visit Diagnosis: Muscle weakness (generalized) (M62.81);Difficulty in walking, not elsewhere classified (R26.2);History of falling (Z91.81)    Time: 6962-9528 PT Time Calculation (min) (ACUTE ONLY): 60 min   Charges:   PT Evaluation $PT Eval Low Complexity: 1 Low PT Treatments $Gait Training: 8-22 mins $Therapeutic Activity: 8-22 mins        Malachi Pro, DPT 04/02/2021, 1:22 PM

## 2021-04-03 DIAGNOSIS — U071 COVID-19: Secondary | ICD-10-CM | POA: Diagnosis not present

## 2021-04-03 DIAGNOSIS — G9349 Other encephalopathy: Secondary | ICD-10-CM | POA: Diagnosis not present

## 2021-04-03 DIAGNOSIS — K7460 Unspecified cirrhosis of liver: Secondary | ICD-10-CM

## 2021-04-03 DIAGNOSIS — T796XXA Traumatic ischemia of muscle, initial encounter: Secondary | ICD-10-CM | POA: Diagnosis not present

## 2021-04-03 DIAGNOSIS — K7581 Nonalcoholic steatohepatitis (NASH): Secondary | ICD-10-CM | POA: Diagnosis not present

## 2021-04-03 LAB — CBC WITH DIFFERENTIAL/PLATELET
Abs Immature Granulocytes: 0.02 10*3/uL (ref 0.00–0.07)
Basophils Absolute: 0 10*3/uL (ref 0.0–0.1)
Basophils Relative: 0 %
Eosinophils Absolute: 0.1 10*3/uL (ref 0.0–0.5)
Eosinophils Relative: 2 %
HCT: 27.6 % — ABNORMAL LOW (ref 39.0–52.0)
Hemoglobin: 9.4 g/dL — ABNORMAL LOW (ref 13.0–17.0)
Immature Granulocytes: 0 %
Lymphocytes Relative: 16 %
Lymphs Abs: 0.8 10*3/uL (ref 0.7–4.0)
MCH: 30.9 pg (ref 26.0–34.0)
MCHC: 34.1 g/dL (ref 30.0–36.0)
MCV: 90.8 fL (ref 80.0–100.0)
Monocytes Absolute: 0.5 10*3/uL (ref 0.1–1.0)
Monocytes Relative: 9 %
Neutro Abs: 4 10*3/uL (ref 1.7–7.7)
Neutrophils Relative %: 73 %
Platelets: 81 10*3/uL — ABNORMAL LOW (ref 150–400)
RBC: 3.04 MIL/uL — ABNORMAL LOW (ref 4.22–5.81)
RDW: 15.5 % (ref 11.5–15.5)
WBC: 5.4 10*3/uL (ref 4.0–10.5)
nRBC: 0 % (ref 0.0–0.2)

## 2021-04-03 LAB — COMPREHENSIVE METABOLIC PANEL
ALT: 45 U/L — ABNORMAL HIGH (ref 0–44)
AST: 91 U/L — ABNORMAL HIGH (ref 15–41)
Albumin: 2.4 g/dL — ABNORMAL LOW (ref 3.5–5.0)
Alkaline Phosphatase: 55 U/L (ref 38–126)
Anion gap: 7 (ref 5–15)
BUN: 47 mg/dL — ABNORMAL HIGH (ref 8–23)
CO2: 20 mmol/L — ABNORMAL LOW (ref 22–32)
Calcium: 7.9 mg/dL — ABNORMAL LOW (ref 8.9–10.3)
Chloride: 108 mmol/L (ref 98–111)
Creatinine, Ser: 1.25 mg/dL — ABNORMAL HIGH (ref 0.61–1.24)
GFR, Estimated: 60 mL/min — ABNORMAL LOW (ref >60)
Glucose, Bld: 99 mg/dL (ref 70–99)
Potassium: 3.5 mmol/L (ref 3.5–5.1)
Sodium: 135 mmol/L (ref 135–145)
Total Bilirubin: 1 mg/dL (ref 0.3–1.2)
Total Protein: 5.2 g/dL — ABNORMAL LOW (ref 6.5–8.1)

## 2021-04-03 LAB — C-REACTIVE PROTEIN: CRP: 6.5 mg/dL — ABNORMAL HIGH (ref <1.0)

## 2021-04-03 LAB — HEMOGLOBIN A1C
Hgb A1c MFr Bld: 6.4 % — ABNORMAL HIGH (ref 4.8–5.6)
Mean Plasma Glucose: 137 mg/dL

## 2021-04-03 LAB — GLUCOSE, CAPILLARY
Glucose-Capillary: 93 mg/dL (ref 70–99)
Glucose-Capillary: 175 mg/dL — ABNORMAL HIGH (ref 70–99)
Glucose-Capillary: 189 mg/dL — ABNORMAL HIGH (ref 70–99)
Glucose-Capillary: 171 mg/dL — ABNORMAL HIGH (ref 70–99)

## 2021-04-03 LAB — CK: Total CK: 1175 U/L — ABNORMAL HIGH (ref 49–397)

## 2021-04-03 LAB — D-DIMER, QUANTITATIVE: D-Dimer, Quant: 1.7 ug/mL-FEU — ABNORMAL HIGH (ref 0.00–0.50)

## 2021-04-03 MED ORDER — TRAZODONE HCL 50 MG PO TABS
25.0000 mg | ORAL_TABLET | Freq: Every day | ORAL | Status: DC
  Administered 2021-04-03 – 2021-04-09 (×7): 25 mg via ORAL
  Filled 2021-04-03 (×7): qty 1

## 2021-04-03 NOTE — Progress Notes (Signed)
Physical Therapy Treatment Patient Details Name: Charles Wilkinson MRN: [id number] DOB: 01-Jun-1945 Today's Date: 04/03/2021   History of Present Illness 75 year old male brought to the ED after an unwitnessed fall at his facility.  Positive  COVID-19 encephalopathy and acute rehabdomyolosis. Admitted for IV fluid hydration and IV remdesivir. Pt fell in hosptial requiring stapltes in his scalp. PMH significant for DM II, HTN, hypothyroidsim, hyperlipedemia, NASH and GERD    PT Comments    Pt again pleasant and interactive with PT, eager to participate.  He was able to do multiple loops to door and back; >100 ft on first bout and then after a seated break another ~50 ft.  Pt with admittedly slow, stooped and limping posture, however he reports being relatively close to his baseline in this regard.  Pt has some issues with initially boosting himself to standing from standard height recliner, however with minimal guidance he was able to reposition and use some momentum to get to standing multiple times w/o Pt needing to touch him.  Pt had no LOBs, stable vitals and only mild c/o fatigue with the effort today. Pt repeatedly assures he wishes to go home (as opposed to rehab) and was clear of mind and showed good safety awareness t/o the session.  Pt will not have the dining hall as an option when he goes home (apparently closed staring today due to Covid outbreak) but he discussed multiple options - discussed below - and feels that he will be able to manage. Pt is very interested in having HHPT come and work with him and this remains the recommendation at this time.   Recommendations for follow up therapy are one component of a multi-disciplinary discharge planning process, led by the attending physician.  Recommendations may be updated based on patient status, additional functional criteria and insurance authorization.  Follow Up Recommendations  Home health PT     Assistance Recommended at Discharge PRN   Equipment Recommendations  Other (comment) (Life Alert)    Recommendations for Other Services       Precautions / Restrictions Precautions Precautions: Fall Restrictions Weight Bearing Restrictions: No     Mobility  Bed Mobility               General bed mobility comments: in recliner on arrival, had gotten up w/o assist with OT earlier today    Transfers Overall transfer level: Needs assistance Equipment used: Rolling walker (2 wheels) Transfers: Sit to/from Stand Sit to Stand: Min guard           General transfer comment: Pt struggled to get to standing on his own on first 2 attempts, but w/ very limited cuing was able to figure out to scoot hips forward and do a little momentum rocking to ultimately rise to standing on multiple occasions during the session w/o any direct assist.  Pt states that he has an electric lift recliner at home making getting to standing much easier in that setting    Ambulation/Gait Ambulation/Gait assistance: Modified independent (Device/Increase time) Gait Distance (Feet): 110 Feet Assistive device: Rolling walker (2 wheels)         General Gait Details: Slow, stooped, AD reliant gait but with no LOBs, or excessive fatigue.  He has limp related to baseline L LE weakness and leg length discrepency but apart from feeling "slightly slower" states he is relatively close to his baseline. After seated rest break pt was able to again rise and do another bout of ~50 ft of  ambulation again with slow but safe cadence - O2 remained in the mid/high 90s on room air and HR in the 70-low 80s range t/o   Stairs             Wheelchair Mobility    Modified Rankin (Stroke Patients Only)       Balance Overall balance assessment: Needs assistance Sitting-balance support: Feet supported Sitting balance-Leahy Scale: Normal     Standing balance support: Bilateral upper extremity supported;During functional activity Standing balance-Leahy  Scale: Good Standing balance comment: Pt was able to maintain static standing w/o UE use to grab tissue and wipe nose mid ambulation.  Good confidence, no LOBs or overt safety concerns t/o the session.                            Cognition Arousal/Alertness: Awake/alert Behavior During Therapy: WFL for tasks assessed/performed Overall Cognitive Status: Within Functional Limits for tasks assessed                                 General Comments: alert and oriented x4, appropriate safety awareness able to hold cogent and appropriate conversation regarding situation and generally        Exercises      General Comments General comments (skin integrity, edema, etc.): today he found out they are closing the dining hall at his place due to Covid (he walks down for lunch and then brings back the left overs for dinner everyday).  He does report that he uses [employer] for grocery delivery, as well as having some "actually pretty good" frozen meals from [employer] meal delivery in the freezer and that he can order more of them if needed...  He also shared that he talked with someone today about increasing some paid help assisting in addition to the 3x/wk he has someone coming to help with ADLs/housekeeping/etc      Pertinent Vitals/Pain Pain Score:  (reports minimal chronic L LE pain)    Home Living                          Prior Function            PT Goals (current goals can now be found in the care plan section) Progress towards PT goals: Progressing toward goals    Frequency    Min 2X/week      PT Plan Current plan remains appropriate    Co-evaluation              AM-PAC PT "6 Clicks" Mobility   Outcome Measure  Help needed turning from your back to your side while in a flat bed without using bedrails?: None Help needed moving from lying on your back to sitting on the side of a flat bed without using bedrails?: None Help needed  moving to and from a bed to a chair (including a wheelchair)?: None Help needed standing up from a chair using your arms (e.g., wheelchair or bedside chair)?: None Help needed to walk in hospital room?: A Little Help needed climbing 3-5 steps with a railing? : A Little 6 Click Score: 22    End of Session   Activity Tolerance: Patient tolerated treatment well Patient left: with chair alarm set;with call bell/phone within reach Nurse Communication: Mobility status PT Visit Diagnosis: Muscle weakness (generalized) (M62.81);Difficulty in walking, not elsewhere classified (R26.2);History  of falling (Z91.81)     Time: 6759-1638 PT Time Calculation (min) (ACUTE ONLY): 42 min  Charges:  $Gait Training: 23-37 mins $Therapeutic Activity: 8-22 mins                     Malachi Pro, DPT 04/03/2021, 5:02 PM

## 2021-04-03 NOTE — TOC Initial Note (Signed)
Transition of Care [facility]) - Initial/Assessment Note    Patient Details  Name: Charles Wilkinson MRN: [id number] Date of Birth: Jan 18, 1946  [facility] CM/SW Contact:    Caryn Section, RN Phone Number: 04/03/2021, 3:01 PM  Clinical Narrative:    Patient's daughter called, Nicki [place]adal[place].  He lives in independent living and she is concerned because he could not get out of bed yesterday.  She is asking for patient to go to SNF.  Patient is ambulatory with some assistance.  Recommendations are for patients to go home with home health.   Daughter states she asked for physical therapy to re evaluate patient tomorrow.  PT advised and will see patient today for evaluations.  RNCM explained that Physical therapy makes recommendations, and there are also insurance guidelines for SNF.  Patient's daughter states she believes another evaluation will show patient needs SNF. Evaluation ordered and PT will see patient today.    TOC contact information provided, toc to follow to discharge.           Expected Discharge Plan: Home w Home Health Services Barriers to Discharge: Continued Medical Work up   Patient Goals and CMS Choice     Choice offered to / list presented to : NA  Expected Discharge Plan and Services Expected Discharge Plan: Home w Home Health Services   Discharge Planning Services: CM Consult   Living arrangements for the past 2 months: Independent Living Facility                                      Prior Living Arrangements/Services Living arrangements for the past 2 months: Independent Living Facility Lives with:: Facility Resident Patient language and need for interpreter reviewed:: Yes Do you feel safe going back to the place where you live?: Yes      Need for Family Participation in Patient Care: Yes (Comment) Care giver support system in place?: Yes (comment)   Criminal Activity/Legal Involvement Pertinent to Current Situation/Hospitalization: No - Comment  as needed  Activities of Daily Living Home Assistive Devices/Equipment: None ADL Screening (condition at time of admission) Patient's cognitive ability adequate to safely complete daily activities?: Yes Is the patient deaf or have difficulty hearing?: No Does the patient have difficulty seeing, even when wearing glasses/contacts?: No Does the patient have difficulty concentrating, remembering, or making decisions?: Yes Patient able to express need for assistance with ADLs?: Yes Does the patient have difficulty dressing or bathing?: No Independently performs ADLs?: Yes (appropriate for developmental age) Does the patient have difficulty walking or climbing stairs?: Yes Weakness of Legs: Both Weakness of Arms/Hands: Both  Permission Sought/Granted Permission sought to share information with : Case Manager Permission granted to share information with : Yes, Verbal Permission Granted              Emotional Assessment Appearance:: Appears stated age       Alcohol / Substance Use: Not Applicable Psych Involvement: No (comment)  Admission diagnosis:  Fall, initial encounter [W19.XXXA] Non-traumatic rhabdomyolysis [M62.82] Altered mental status, unspecified altered mental status type [R41.82] 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 [U07.1, G93.49] COVID-19 [U07.1] Patient Active Problem List   Diagnosis Date Noted   Thrombocytopenia (HCC)    Acute pain of left knee    

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
## 2021-04-03 NOTE — Evaluation (Signed)
Occupational Therapy Evaluation Patient Details Name: Charles Wilkinson MRN: [id number] DOB: 11/21/1945 Today's Date: 04/03/2021   History of Present Illness 75 year old male brought to the ED after a fall.  Positive for acute COVID-19 encephalopathy and acute rehabdomyolosis. Admitted for IV fluid hydration and IV remdesivir. Pt fell in hosptial requiring stapltes in his scalp. PMH significant for DM II, HTN, hypothyroidsim, hyperlipedemia, NASH and GERD   Clinical Impression   Chart reviewed, RN cleared pt for participation in OT evaluation. Pt greeted in bed, alert and oriented x4, agreeable to evaluation. Pt lives in an [place] with assistance provided for IADLs- can perform BADLS with MOD I-I. Pt performs STS with MIN A, short ambulatory transfer to bedside chair with CGA with RW. Pt performs grooming tasks in seated with set up. Pt presents with generalized weakness, pain, deconditioning affecting optimal independent ADL completion. Pt would benefit from [facility] to address functional deficits and to return to PLOF. Pt is left in bedside chair, NAD, all needs met. OT will continue to follow while admitted.      Recommendations for follow up therapy are one component of a multi-disciplinary discharge planning process, led by the attending physician.  Recommendations may be updated based on patient status, additional functional criteria and insurance authorization.   Follow Up Recommendations  Home health OT    Assistance Recommended at Discharge Intermittent Supervision/Assistance  Functional Status Assessment  Patient has had a recent decline in their functional status and demonstrates the ability to make significant improvements in function in a reasonable and predictable amount of time.  Equipment Recommendations   (pt has recommended equipment at home)    Recommendations for Other Services       Precautions / Restrictions Precautions Precautions: Fall Restrictions Weight Bearing  Restrictions: No      Mobility Bed Mobility Overal bed mobility: Needs Assistance Bed Mobility: Supine to Sit     Supine to sit: Supervision;HOB elevated          Transfers Overall transfer level: Needs assistance Equipment used: Rolling walker (2 wheels) Transfers: Sit to/from Stand;Bed to chair/wheelchair/BSC Sit to Stand: Min assist     Step pivot transfers: Min guard            Balance Overall balance assessment: Needs assistance Sitting-balance support: Feet supported Sitting balance-Leahy Scale: Normal     Standing balance support: Bilateral upper extremity supported;During functional activity Standing balance-Leahy Scale: Good                             ADL either performed or assessed with clinical judgement   ADL Overall ADL's : Needs assistance/impaired Eating/Feeding: Set up   Grooming: Set up;Sitting Grooming Details (indicate cue type and reason): washing face             Lower Body Dressing: Moderate assistance   Toilet Transfer: Min guard;Rolling walker (2 wheels) Toilet Transfer Details (indicate cue type and reason): simulated         Functional mobility during ADLs: Min guard;Minimal assistance;Rolling walker (2 wheels) General ADL Comments: STS with MIN A, short ambulatory transfer to bedside chair with RW with CGA     Vision Baseline Vision/History: 1 Wears glasses Patient Visual Report: No change from baseline       Perception     Praxis      Pertinent Vitals/Pain Pain Assessment: 0-10 Pain Score: 4  Pain Location: generalized- L knee and shoulder Pain Descriptors /  Indicators: Aching;Sharp Pain Intervention(s): Limited activity within patient's tolerance;Monitored during session;Repositioned     Hand Dominance     Extremity/Trunk Assessment Upper Extremity Assessment Upper Extremity Assessment: LUE deficits/detail LUE Deficits / Details: pt able to perform AROM to approx 3/4 full AROM; imaging  negative;   Lower Extremity Assessment Lower Extremity Assessment: Overall WFL for tasks assessed       Communication Communication Communication: No difficulties   Cognition Arousal/Alertness: Awake/alert Behavior During Therapy: WFL for tasks assessed/performed Overall Cognitive Status: Within Functional Limits for tasks assessed                                 General Comments: alert and oriented x4, appropriate safety awareness     General Comments  spo2 92-94% with mobility; recovered with pursed lipped breathing    Exercises     Shoulder Instructions      Home Living Family/patient expects to be discharged to:: Assisted living (ILF) Living Arrangements: Alone Available Help at Discharge: Personal care attendant;Family Type of Home: Independent living facility       Home Layout: One level     Bathroom Shower/Tub: Walk-in shower         Home Equipment: Conservation officer, nature (2 wheels);Rollator (4 wheels);Shower seat - built in;Grab bars - toilet;Grab bars - tub/shower;Hand held shower head          Prior Functioning/Environment Prior Level of Function : Independent/Modified Independent             Mobility Comments: States he uses a rollator at all times ADLs Comments: ILF-  assistance provided with laundry, cleaning, meals; can perform dressing, bathing, light meal prep, light cleaning with MOD I-I        OT Problem List: Decreased strength;Impaired balance (sitting and/or standing);Decreased activity tolerance;Decreased knowledge of use of DME or AE      OT Treatment/Interventions: Self-care/ADL training;DME and/or AE instruction;Therapeutic activities;Balance training;Therapeutic exercise;Patient/family education;Modalities    OT Goals(Current goals can be found in the care plan section) Acute Rehab OT Goals Patient Stated Goal: to go home OT Goal Formulation: With patient Time For Goal Achievement: 04/17/21 Potential to Achieve  Goals: Good ADL Goals Pt Will Perform Grooming: with modified independence;standing Pt Will Perform Upper Body Dressing: with modified independence;sitting Pt Will Perform Lower Body Dressing: with modified independence;sit to/from stand Pt Will Transfer to Toilet: with modified independence Pt Will Perform Toileting - Clothing Manipulation and hygiene: with modified independence;sit to/from stand  OT Frequency: Min 2X/week   Barriers to D/C:            Co-evaluation              AM-PAC OT "6 Clicks" Daily Activity     Outcome Measure Help from another person eating meals?: None Help from another person taking care of personal grooming?: A Little Help from another person toileting, which includes using toliet, bedpan, or urinal?: A Little Help from another person bathing (including washing, rinsing, drying)?: A Little Help from another person to put on and taking off regular upper body clothing?: A Little Help from another person to put on and taking off regular lower body clothing?: A Little 6 Click Score: 19   End of Session Equipment Utilized During Treatment: Gait belt;Rolling walker (2 wheels) Nurse Communication: Mobility status  Activity Tolerance: Patient tolerated treatment well Patient left: in chair;with call bell/phone within reach;with chair alarm set  OT Visit Diagnosis: Unsteadiness  on feet (R26.81);Other abnormalities of gait and mobility (R26.89);Repeated falls (R29.6)                Time: 3491-7915 OT Time Calculation (min): 37 min Charges:  OT General Charges $OT Visit: 1 Visit OT Evaluation $OT Eval Low Complexity: 1 Low OT Treatments $Self Care/Home Management : 8-22 mins  Shanon Payor, OTD OTR/L  04/03/21, 10:25 AM

## 2021-04-04 ENCOUNTER — Telehealth: Payer: Self-pay | Admitting: Emergency Medicine

## 2021-04-04 DIAGNOSIS — G9349 Other encephalopathy: Secondary | ICD-10-CM | POA: Diagnosis not present

## 2021-04-04 DIAGNOSIS — U071 COVID-19: Secondary | ICD-10-CM | POA: Diagnosis not present

## 2021-04-04 DIAGNOSIS — T796XXA Traumatic ischemia of muscle, initial encounter: Secondary | ICD-10-CM | POA: Diagnosis not present

## 2021-04-04 DIAGNOSIS — K7581 Nonalcoholic steatohepatitis (NASH): Secondary | ICD-10-CM | POA: Diagnosis not present

## 2021-04-04 LAB — GLUCOSE, CAPILLARY
Glucose-Capillary: 106 mg/dL — ABNORMAL HIGH (ref 70–99)
Glucose-Capillary: 177 mg/dL — ABNORMAL HIGH (ref 70–99)
Glucose-Capillary: 141 mg/dL — ABNORMAL HIGH (ref 70–99)
Glucose-Capillary: 146 mg/dL — ABNORMAL HIGH (ref 70–99)

## 2021-04-04 LAB — COMPREHENSIVE METABOLIC PANEL
ALT: 44 U/L (ref 0–44)
AST: 79 U/L — ABNORMAL HIGH (ref 15–41)
Albumin: 2.3 g/dL — ABNORMAL LOW (ref 3.5–5.0)
Alkaline Phosphatase: 62 U/L (ref 38–126)
Anion gap: 7 (ref 5–15)
BUN: 35 mg/dL — ABNORMAL HIGH (ref 8–23)
CO2: 20 mmol/L — ABNORMAL LOW (ref 22–32)
Calcium: 7.8 mg/dL — ABNORMAL LOW (ref 8.9–10.3)
Chloride: 108 mmol/L (ref 98–111)
Creatinine, Ser: 1.05 mg/dL (ref 0.61–1.24)
GFR, Estimated: >60 mL/min (ref >60)
Glucose, Bld: 106 mg/dL — ABNORMAL HIGH (ref 70–99)
Potassium: 3.4 mmol/L — ABNORMAL LOW (ref 3.5–5.1)
Sodium: 135 mmol/L (ref 135–145)
Total Bilirubin: 0.9 mg/dL (ref 0.3–1.2)
Total Protein: 5.2 g/dL — ABNORMAL LOW (ref 6.5–8.1)

## 2021-04-04 LAB — CBC WITH DIFFERENTIAL/PLATELET
Abs Immature Granulocytes: 0.01 10*3/uL (ref 0.00–0.07)
Basophils Absolute: 0 10*3/uL (ref 0.0–0.1)
Basophils Relative: 1 %
Eosinophils Absolute: 0.1 10*3/uL (ref 0.0–0.5)
Eosinophils Relative: 4 %
HCT: 25.4 % — ABNORMAL LOW (ref 39.0–52.0)
Hemoglobin: 8.7 g/dL — ABNORMAL LOW (ref 13.0–17.0)
Immature Granulocytes: 0 %
Lymphocytes Relative: 20 %
Lymphs Abs: 0.6 10*3/uL — ABNORMAL LOW (ref 0.7–4.0)
MCH: 30.6 pg (ref 26.0–34.0)
MCHC: 34.3 g/dL (ref 30.0–36.0)
MCV: 89.4 fL (ref 80.0–100.0)
Monocytes Absolute: 0.4 10*3/uL (ref 0.1–1.0)
Monocytes Relative: 13 %
Neutro Abs: 1.9 10*3/uL (ref 1.7–7.7)
Neutrophils Relative %: 62 %
Platelets: 66 10*3/uL — ABNORMAL LOW (ref 150–400)
RBC: 2.84 MIL/uL — ABNORMAL LOW (ref 4.22–5.81)
RDW: 15.4 % (ref 11.5–15.5)
WBC: 3 10*3/uL — ABNORMAL LOW (ref 4.0–10.5)
nRBC: 0 % (ref 0.0–0.2)

## 2021-04-04 LAB — CK: Total CK: 872 U/L — ABNORMAL HIGH (ref 49–397)

## 2021-04-04 LAB — D-DIMER, QUANTITATIVE: D-Dimer, Quant: 1.45 ug/mL-FEU — ABNORMAL HIGH (ref 0.00–0.50)

## 2021-04-04 LAB — C-REACTIVE PROTEIN: CRP: 5.9 mg/dL — ABNORMAL HIGH (ref <1.0)

## 2021-04-04 MED ORDER — PREDNISONE 20 MG PO TABS
20.0000 mg | ORAL_TABLET | Freq: Every day | ORAL | Status: AC
  Administered 2021-04-05 – 2021-04-07 (×3): 20 mg via ORAL
  Filled 2021-04-04 (×3): qty 1

## 2021-04-04 MED ORDER — NADOLOL 40 MG PO TABS
40.0000 mg | ORAL_TABLET | Freq: Every day | ORAL | Status: DC
  Administered 2021-04-04 – 2021-04-06 (×3): 40 mg via ORAL
  Filled 2021-04-04 (×3): qty 1

## 2021-04-04 MED ORDER — POTASSIUM CHLORIDE CRYS ER 20 MEQ PO TBCR
20.0000 meq | EXTENDED_RELEASE_TABLET | Freq: Once | ORAL | Status: AC
  Administered 2021-04-04: 10:00:00 20 meq via ORAL
  Filled 2021-04-04: qty 1

## 2021-04-04 NOTE — Telephone Encounter (Signed)
1C interm Director Lance Bosch, contracted this Production assistant, radio Mr Spieker and his experience in the ED. I went to visit the patient in 84 , his daughter was at bedside.  They wanted to speak to ED leadership directly referencing the fall occurrence the patient had while in the ED.  Mr.Everingham was able to recall the timeline of events that occurred.  Mr.Bernards's daughter was able to verbalize her concerns and frustrations as well.  After our conversation was completed. Miss Lamp was handed this Clinical research associate contact information if further concerns came about.

## 2021-04-04 NOTE — Progress Notes (Signed)
Contacted by Orma Flaming RN DD. Stated daughter was upset, requested I speak with her. Met with Marlene Bast, daughter([phone]).  Stated she was concerned about her father falling in the ED, wondering how frequently he had been checked on, and how long he had been on the floor after he fell. She also asked about any paperwork to make administration aware of her father's fall. I reassured her ED management had been made aware of the fall and we had followed our post fall bundle. She states she is concerned about the future care her father will have to have to get the staples removed, states she is concerned one of the staples appears deeper in the wound than the others. I contacted Romie Minus RN ED Director, that the daughter would like to speak with him, but explained he was currently in staffing and might not call back immediately. Gave her the [facility] phone number. Talked with Miss Garrette for about 10 minutes.

## 2021-04-04 NOTE — Progress Notes (Signed)
Physical Therapy Treatment Patient Details Name: Charles Wilkinson MRN: [id number] DOB: 10-30-1945 Today's Date: 04/04/2021   History of Present Illness 75 year old male brought to the ED after an unwitnessed fall at his facility.  Positive  COVID-19 encephalopathy and acute rehabdomyolosis. Admitted for IV fluid hydration and IV remdesivir. Pt fell in hosptial requiring stapltes in his scalp. PMH significant for DM II, HTN, hypothyroidsim, hyperlipedemia, NASH and GERD    PT Comments    Extended PT session with patient and daughter. Re-evaluation performed, daughter able to observe and have input as needed.  Patient requiring min assist for all functional mobility this date; does endorse generalized fatigue and increased pain/soreness to L hand/foot due to 'gout flare up' Discussed current functional status and level of assist required, equipment recommendations/resources. Discussed discharge options in detail-pros and cons of rehab versus home with in-home support. At this time, patient/daughter both in agreement for discharge home with max HH services (PT, OT, RN, aide), increased private caregiver support, and increased support/presence from both daughters.   I have scheduled a follow up PT session with both daughters for tomorrow after lunch to review mobility and facilitate some hands-on training to maximize their comfort with providing care.   Asked daughter/patient to establish goal of OOB to chair for meals moving forward (calling for nursing assist as needed); discussed need for assisted toileting outside of therapy (discontinuation of purewick/urinal) to maximize mobility opportunities and better simulate home environment/demands.  Nursing team aware.  Care team aware of discussion/outcome of session.  Will continue to follow for mobility progression next date.    Recommendations for follow up therapy are one component of a multi-disciplinary discharge planning process, led by the  attending physician.  Recommendations may be updated based on patient status, additional functional criteria and insurance authorization.  Follow Up Recommendations  Home health PT     Assistance Recommended at Discharge Frequent or constant Supervision/Assistance  Equipment Recommendations  Rolling walker (2 wheels)    Recommendations for Other Services       Precautions / Restrictions Precautions Precautions: Fall Restrictions Weight Bearing Restrictions: No     Mobility  Bed Mobility Overal bed mobility: Needs Assistance Bed Mobility: Supine to Sit;Sit to Supine     Supine to sit: Min assist Sit to supine: Min assist   General bed mobility comments: initial assist to initiate and sequence movement transition; min physical assist for LE management, truncal elevation.  Of note, bed surface laid flat to optimally simulate home environment.    Transfers Overall transfer level: Needs assistance Equipment used: Rolling walker (2 wheels)   Sit to Stand: Min guard;Min assist Stand pivot transfers: Min assist         General transfer comment: cuing for mechanics of sit/stand to optimize safety/indep with transfer; min assist for anterior weight translation with lift off from seating surfaces.  Does require use of rocking/momentum to fully complete lift off    Ambulation/Gait Ambulation/Gait assistance: Min assist Gait Distance (Feet): 50 Feet Assistive device: Rollator (4 wheels)         General Gait Details: forward flexed posture, slight lean/weight shift to L LE; narrowed BOS, improve with cuing to increased step width.  Slow and deliberate/effortful with distance, but no overt buckling or LOB.  Patient endorses feeling generally 'weaker' with gait efforts, but patient/daughter both agree that mechanics/posture are largely baseline for him.  May benefit from trial of RW vs 4WRW; will plan for next session as able.   Stairs  Wheelchair Mobility     Modified Rankin (Stroke Patients Only)       Balance Overall balance assessment: Needs assistance Sitting-balance support: No upper extremity supported;Feet supported Sitting balance-Leahy Scale: Good     Standing balance support: Bilateral upper extremity supported Standing balance-Leahy Scale: Fair Standing balance comment: Mild posterior LOB if UEs released from walker, min assist to maintain standing/balance                            Cognition Arousal/Alertness: Awake/alert Behavior During Therapy: WFL for tasks assessed/performed Overall Cognitive Status: Within Functional Limits for tasks assessed                                 General Comments: generally slow in response, but follows commands, demonstrates good insight/awareness of conversation; able to actively participate in conversation surrounding discharge planning/options        Exercises Other Exercises Other Exercises: Toilet tranfser, SPT with RW, min assist; sit/stand from [facility] with RW, min assist.  Standing balance for hygiene, cga/min assist; dep for hygiene.    General Comments        Pertinent Vitals/Pain Pain Assessment: Faces Faces Pain Scale: Hurts little more Pain Location: L hand/foot ("gout flare per patient report") Pain Intervention(s): Limited activity within patient's tolerance;Monitored during session;Patient requesting pain meds-RN notified    Home Living                          Prior Function            PT Goals (current goals can now be found in the care plan section) Acute Rehab PT Goals Patient Stated Goal: Go back to his apartment PT Goal Formulation: With patient Time For Goal Achievement: 04/16/21 Potential to Achieve Goals: Good Progress towards PT goals: Progressing toward goals    Frequency    Min 2X/week      PT Plan Current plan remains appropriate    Co-evaluation              AM-PAC PT "6 Clicks" Mobility    Outcome Measure  Help needed turning from your back to your side while in a flat bed without using bedrails?: None Help needed moving from lying on your back to sitting on the side of a flat bed without using bedrails?: A Little Help needed moving to and from a bed to a chair (including a wheelchair)?: A Little Help needed standing up from a chair using your arms (e.g., wheelchair or bedside chair)?: A Little Help needed to walk in hospital room?: A Little Help needed climbing 3-5 steps with a railing? : A Little 6 Click Score: 19    End of Session Equipment Utilized During Treatment: Gait belt Activity Tolerance: Patient tolerated treatment well Patient left: with call bell/phone within reach;in bed;with bed alarm set;with family/visitor present Nurse Communication: Mobility status PT Visit Diagnosis: Muscle weakness (generalized) (M62.81);Difficulty in walking, not elsewhere classified (R26.2);History of falling (Z91.81)     Time: 0932-6712 PT Time Calculation (min) (ACUTE ONLY): 79 min  Charges:  $Gait Training: 8-22 mins $Therapeutic Activity: 53-67 mins                    Pacen Watford H. Manson Passey, PT, DPT, NCS 04/04/21, 5:12 PM [phone]
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
## 2021-04-04 NOTE — Progress Notes (Signed)
Family upset at bedside regarding fall in ED. Escalated to [facility] and ED leadership. Perlie Mayo, RN

## 2021-04-04 NOTE — TOC Progression Note (Signed)
Transition of Care [facility]) - Progression Note    Patient Details  Name: Charles Wilkinson MRN: [id number] Date of Birth: 08-10-1945  Transition of Care [facility]) CM/SW Contact  Caryn Section, RN Phone Number: 04/04/2021, 4:57 PM  Clinical Narrative:  [facility] can accept patient as per Elnita Maxwell for PT, OT RN.  Care team made aware.  Will continue to monitor for needs through admission.  TOC contact information provided, TOC to follow up with rolling walker delivery in AM, Zach at [employer] notified of walker need.     Expected Discharge Plan: Home w Home Health Services Barriers to Discharge: Continued Medical Work up  Expected Discharge Plan and Services Expected Discharge Plan: Home w Home Health Services   Discharge Planning Services: CM Consult   Living arrangements for the past 2 months: Independent Living Facility                                       Social Determinants of Health (SDOH) Interventions    Readmission Risk Interventions No flowsheet data found.

## 2021-04-05 DIAGNOSIS — T796XXA Traumatic ischemia of muscle, initial encounter: Secondary | ICD-10-CM | POA: Diagnosis not present

## 2021-04-05 DIAGNOSIS — U071 COVID-19: Secondary | ICD-10-CM | POA: Diagnosis not present

## 2021-04-05 DIAGNOSIS — G9349 Other encephalopathy: Secondary | ICD-10-CM | POA: Diagnosis not present

## 2021-04-05 DIAGNOSIS — K7581 Nonalcoholic steatohepatitis (NASH): Secondary | ICD-10-CM | POA: Diagnosis not present

## 2021-04-05 LAB — GLUCOSE, CAPILLARY
Glucose-Capillary: 125 mg/dL — ABNORMAL HIGH (ref 70–99)
Glucose-Capillary: 277 mg/dL — ABNORMAL HIGH (ref 70–99)
Glucose-Capillary: 330 mg/dL — ABNORMAL HIGH (ref 70–99)
Glucose-Capillary: 211 mg/dL — ABNORMAL HIGH (ref 70–99)

## 2021-04-05 LAB — CBC WITH DIFFERENTIAL/PLATELET
Abs Immature Granulocytes: 0.01 10*3/uL (ref 0.00–0.07)
Basophils Absolute: 0 10*3/uL (ref 0.0–0.1)
Basophils Relative: 0 %
Eosinophils Absolute: 0.1 10*3/uL (ref 0.0–0.5)
Eosinophils Relative: 3 %
HCT: 25.2 % — ABNORMAL LOW (ref 39.0–52.0)
Hemoglobin: 8.5 g/dL — ABNORMAL LOW (ref 13.0–17.0)
Immature Granulocytes: 0 %
Lymphocytes Relative: 16 %
Lymphs Abs: 0.6 10*3/uL — ABNORMAL LOW (ref 0.7–4.0)
MCH: 30.7 pg (ref 26.0–34.0)
MCHC: 33.7 g/dL (ref 30.0–36.0)
MCV: 91 fL (ref 80.0–100.0)
Monocytes Absolute: 0.5 10*3/uL (ref 0.1–1.0)
Monocytes Relative: 14 %
Neutro Abs: 2.5 10*3/uL (ref 1.7–7.7)
Neutrophils Relative %: 67 %
Platelets: 68 10*3/uL — ABNORMAL LOW (ref 150–400)
RBC: 2.77 MIL/uL — ABNORMAL LOW (ref 4.22–5.81)
RDW: 15.2 % (ref 11.5–15.5)
WBC: 3.8 10*3/uL — ABNORMAL LOW (ref 4.0–10.5)
nRBC: 0 % (ref 0.0–0.2)

## 2021-04-05 LAB — COMPREHENSIVE METABOLIC PANEL
ALT: 42 U/L (ref 0–44)
AST: 63 U/L — ABNORMAL HIGH (ref 15–41)
Albumin: 2.3 g/dL — ABNORMAL LOW (ref 3.5–5.0)
Alkaline Phosphatase: 67 U/L (ref 38–126)
Anion gap: 7 (ref 5–15)
BUN: 33 mg/dL — ABNORMAL HIGH (ref 8–23)
CO2: 21 mmol/L — ABNORMAL LOW (ref 22–32)
Calcium: 7.8 mg/dL — ABNORMAL LOW (ref 8.9–10.3)
Chloride: 106 mmol/L (ref 98–111)
Creatinine, Ser: 1.25 mg/dL — ABNORMAL HIGH (ref 0.61–1.24)
GFR, Estimated: 60 mL/min — ABNORMAL LOW (ref >60)
Glucose, Bld: 158 mg/dL — ABNORMAL HIGH (ref 70–99)
Potassium: 3.6 mmol/L (ref 3.5–5.1)
Sodium: 134 mmol/L — ABNORMAL LOW (ref 135–145)
Total Bilirubin: 0.9 mg/dL (ref 0.3–1.2)
Total Protein: 5.1 g/dL — ABNORMAL LOW (ref 6.5–8.1)

## 2021-04-05 LAB — C-REACTIVE PROTEIN: CRP: 8.6 mg/dL — ABNORMAL HIGH (ref <1.0)

## 2021-04-05 LAB — CK: Total CK: 612 U/L — ABNORMAL HIGH (ref 49–397)

## 2021-04-05 MED ORDER — PHENOL 1.4 % MT LIQD
1.0000 | OROMUCOSAL | Status: DC | PRN
  Filled 2021-04-05: qty 177

## 2021-04-05 NOTE — TOC Progression Note (Signed)
Transition of Care [facility]) - Progression Note    Patient Details  Name: Charles Wilkinson MRN: [id number] Date of Birth: 08/20/1945  Transition of Care [facility]) CM/SW Contact  Caryn Section, RN Phone Number: 04/05/2021, 3:59 PM  Clinical Narrative:   As per PT, recommendation remains HH, PT will see again tomorrow.  [facility] accepts as per Elnita Maxwell.  Walker delivered to patient's room by [employer].  Family states they have BSC.  TOC to follow    Expected Discharge Plan: Home w Home Health Services Barriers to Discharge: Continued Medical Work up  Expected Discharge Plan and Services Expected Discharge Plan: Home w Home Health Services   Discharge Planning Services: CM Consult   Living arrangements for the past 2 months: Independent Living Facility                                       Social Determinants of Health (SDOH) Interventions    Readmission Risk Interventions No flowsheet data found.
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
## 2021-04-05 NOTE — Progress Notes (Signed)
Occupational Therapy Treatment Patient Details Name: Charles Wilkinson MRN: [id number] DOB: 02-15-1946 Today's Date: 04/05/2021   History of present illness 75 year old male brought to the ED after an unwitnessed fall at his facility.  Positive  COVID-19 encephalopathy and acute rehabdomyolosis. Admitted for IV fluid hydration and IV remdesivir. Pt fell in hosptial requiring stapltes in his scalp. PMH significant for DM II, HTN, hypothyroidsim, hyperlipedemia, NASH and GERD   OT comments  Pt seen for OT tx this date. Pt received in recliner, endorsing increased L foot gout pain and requesting OT notify the RN. RN notified promptly via secure chat and brought pain medication during session. Pt reporting feeling weaker than previous date and attributes this to his increased gout pain. Pt required MOD  A to stand from the recliner with VC for scooting forward, foot placement, and hand placement, with increased time to come fully upright in standing. Pt performed step pivot with MIN-MOD A +2 for safety to [facility] with additional VC for sequencing. Pt required additional Min-MOD A +2 for safety from [facility] after toileting and dep for hygiene in standing. Pt demonstrates an increased need for assistance with ADL mobility this date. Medical team notified of pt's limitations and progress. Discharge recommendations updated. May benefit from SNF for skilled OT services upon discharge if unable to progress towards goals.    Recommendations for follow up therapy are one component of a multi-disciplinary discharge planning process, led by the attending physician.  Recommendations may be updated based on patient status, additional functional criteria and insurance authorization.    Follow Up Recommendations  Skilled nursing-short term rehab (<3 hours/day) (SNF vs HH pending progress towards goals and medical improvement)    Assistance Recommended at Discharge Intermittent Supervision/Assistance  Equipment Recommendations   None recommended by OT    Recommendations for Other Services      Precautions / Restrictions Precautions Precautions: Fall Restrictions Weight Bearing Restrictions: No       Mobility Bed Mobility               General bed mobility comments: NT up in recliner    Transfers Overall transfer level: Needs assistance Equipment used: Rolling walker (2 wheels) Transfers: Sit to/from Stand;Bed to chair/wheelchair/BSC Sit to Stand: Mod assist;From elevated surface;+2 safety/equipment   Step pivot transfers: Min assist;Mod assist;+2 safety/equipment       General transfer comment: cues for sequencing, RW mgt     Balance Overall balance assessment: Needs assistance Sitting-balance support: No upper extremity supported;Feet supported;Single extremity supported Sitting balance-Leahy Scale: Good     Standing balance support: Bilateral upper extremity supported;Reliant on assistive device for balance;During functional activity Standing balance-Leahy Scale: Fair Standing balance comment: fair-                           ADL either performed or assessed with clinical judgement   ADL Overall ADL's : Needs assistance/impaired                         Toilet Transfer: Rolling walker (2 wheels);Moderate assistance;Minimal assistance;Cueing for sequencing;+2 for safety/equipment   Toileting- Clothing Manipulation and Hygiene: Maximal assistance;Sit to/from stand;+2 for safety/equipment Toileting - Clothing Manipulation Details (indicate cue type and reason): CGA-MIN A for standing balance while requiring MAXA  for pericare            Extremity/Trunk Assessment Upper Extremity Assessment LUE Deficits / Details: LUE noted with some edema  and redness near IV site, RN notified            Vision       Perception     Praxis      Cognition Arousal/Alertness: Awake/alert Behavior During Therapy: WFL for tasks assessed/performed Overall Cognitive  Status: Within Functional Limits for tasks assessed                                 General Comments: slow proccessing but follows commands well with cues          Exercises Other Exercises Other Exercises: STS transfers w/ RW from recliner and BSC, dep for hygiene in standing with +2 for safety   Shoulder Instructions       General Comments      Pertinent Vitals/ Pain       Pain Assessment: 0-10 Pain Score: 8  Pain Location: L foot 2/2 gout, L hand, and R anterior thigh Pain Descriptors / Indicators: Aching Pain Intervention(s): Limited activity within patient's tolerance;Monitored during session;Patient requesting pain meds-RN notified;RN gave pain meds during session;Repositioned  Home Living                                          Prior Functioning/Environment              Frequency  Min 2X/week        Progress Toward Goals  OT Goals(current goals can now be found in the care plan section)  Progress towards OT goals: OT to reassess next treatment  Acute Rehab OT Goals Patient Stated Goal: to go home OT Goal Formulation: With patient Time For Goal Achievement: 04/17/21 Potential to Achieve Goals: Good  Plan Frequency remains appropriate;Discharge plan needs to be updated    Co-evaluation                 AM-PAC OT "6 Clicks" Daily Activity     Outcome Measure   Help from another person eating meals?: None Help from another person taking care of personal grooming?: A Little Help from another person toileting, which includes using toliet, bedpan, or urinal?: A Lot Help from another person bathing (including washing, rinsing, drying)?: A Lot Help from another person to put on and taking off regular upper body clothing?: A Little Help from another person to put on and taking off regular lower body clothing?: A Lot 6 Click Score: 16    End of Session Equipment Utilized During Treatment: Gait belt;Rolling walker (2  wheels)  OT Visit Diagnosis: Unsteadiness on feet (R26.81);Other abnormalities of gait and mobility (R26.89);Repeated falls (R29.6)   Activity Tolerance Patient tolerated treatment well   Patient Left in chair;with call bell/phone within reach;with chair alarm set   Nurse Communication Mobility status;Patient requests pain meds;Other (comment) (LUE)        Time: 8832-5498 OT Time Calculation (min): 48 min  Charges: OT General Charges $OT Visit: 1 Visit OT Treatments $Self Care/Home Management : 38-52 mins  Arman Filter., MPH, MS, OTR/L ascom [phone] 04/05/21, 12:26 PM

## 2021-04-05 NOTE — Care Management Important Message (Signed)
Important Message  Patient Details  Name: Charles Wilkinson MRN: [id number] Date of Birth: 1945/11/24   Medicare Important Message Given:  Yes  Patient is in an isolation room so I called his room [phone]) to review the Important Message from Medicare with him. He stated he understood his rights and I asked if he would like me to send him a copy and he replied yes. I have sent a copy via secure e-mail to [email or web address] as directed. I wished him a speedy recovery and thanked him for his time.    Olegario Messier A Sandi Towe 04/05/2021, 1:57 PM

## 2021-04-05 NOTE — Progress Notes (Signed)
Physical Therapy Treatment Patient Details Name: Charles Wilkinson MRN: [id number] DOB: 1945-08-11 Today's Date: 04/05/2021   History of Present Illness 75 year old male brought to the ED after an unwitnessed fall at his facility.  Positive  COVID-19 encephalopathy and acute rehabdomyolosis. Admitted for IV fluid hydration and IV remdesivir. Pt fell in hosptial requiring stapltes in his scalp. PMH significant for DM II, HTN, hypothyroidsim, hyperlipedemia, NASH and GERD    PT Comments    Multiple reps of sit/stand from vavious surface heights, emphasis on mechanics and sequencing--min assist from recliner surface, cga/min assist from bed surface (elevated to simulate bed height), cga/close sup from [facility].  All performed with RW; min cuing for sitting position, hand placement and forward trunk lean.  Comfort/confidence in transfers improves with repetition  Does perform optimally with RW (versus 4WRW); recommend continued use of RW for all functional mobility at this time.  Patient voiced understanding and awareness.   Daughter not present in room this date.  Did attempt phone call to daughter to update on status today and to attempt scheduled therapy session next date.  Update: daughter returned call; training session scheduled for Saturday, 12/31, at 1430.  Will communicate to weekend therapist for scheduling.    Recommendations for follow up therapy are one component of a multi-disciplinary discharge planning process, led by the attending physician.  Recommendations may be updated based on patient status, additional functional criteria and insurance authorization.  Follow Up Recommendations  Home health PT     Assistance Recommended at Discharge Frequent or constant Supervision/Assistance  Equipment Recommendations  Rolling walker (2 wheels)    Recommendations for Other Services       Precautions / Restrictions Precautions Precautions: Fall Restrictions Weight Bearing Restrictions: No      Mobility  Bed Mobility Overal bed mobility: Needs Assistance Bed Mobility: Sit to Supine       Sit to supine: Min guard   General bed mobility comments: increased use of momentum for LE elevation into bed    Transfers Overall transfer level: Needs assistance Equipment used: Rolling walker (2 wheels) Transfers: Sit to/from Stand Sit to Stand: Min assist           General transfer comment: continued cuing for transfer mechanics to optimize safety/indep with transfer    Ambulation/Gait Ambulation/Gait assistance: Min guard Gait Distance (Feet): 40 Feet Assistive device: Rolling walker (2 wheels)         General Gait Details: stooped posture, RW slightly ahead of BOS (improved with cuing from therapist).  Improved BOS and overall foot placement this date; less lean noted.  Remains slow and deliberate, but no overt buckling or LOB.  Does perform optimally with RW (versus 4WRW); recommend continued use of RW for all functional mobility at this time.  Patient voiced understanding and awareness.   Stairs             Wheelchair Mobility    Modified Rankin (Stroke Patients Only)       Balance Overall balance assessment: Needs assistance Sitting-balance support: No upper extremity supported;Feet supported Sitting balance-Leahy Scale: Good     Standing balance support: Bilateral upper extremity supported Standing balance-Leahy Scale: Fair                              Cognition Arousal/Alertness: Awake/alert Behavior During Therapy: WFL for tasks assessed/performed Overall Cognitive Status: Within Functional Limits for tasks assessed  General Comments: much more alert and interactive this date        Exercises Other Exercises Other Exercises: Multiple reps of sit/stand from vavious surface heights, emphasis on mechanics and sequencing--min assist from recliner surface, cga/min assist from bed  surface (elevated to simulate bed height), cga/close sup from [facility].  All performed with RW; min cuing for sitting position, hand placement and forward trunk lean.  Comfort/confidence in transfers improves with repetition Other Exercises: Toilet transfer, SPT with RW, cga/min assist; standing balance for hygiene, cga/close sup; dep for hygiene (after continent BM) due to limited space when sitting on BSC Other Exercises: Verbally discussed importance of seat height and impact on functional indep; encouraged adjustment of seating surfaces as able at discharge to maximize safety/indep with all transfers.  Patient voiced understanding; will reinforce with daughter as able.    General Comments        Pertinent Vitals/Pain Pain Assessment: Faces Faces Pain Scale: Hurts little more Pain Location: L foot 2/2 gout, L hand, and R anterior thigh    Home Living                          Prior Function            PT Goals (current goals can now be found in the care plan section) Acute Rehab PT Goals Patient Stated Goal: Go back to his apartment PT Goal Formulation: With patient Time For Goal Achievement: 04/16/21 Potential to Achieve Goals: Good Progress towards PT goals: Progressing toward goals    Frequency    Min 2X/week      PT Plan Current plan remains appropriate    Co-evaluation              AM-PAC PT "6 Clicks" Mobility   Outcome Measure  Help needed turning from your back to your side while in a flat bed without using bedrails?: None Help needed moving from lying on your back to sitting on the side of a flat bed without using bedrails?: A Little Help needed moving to and from a bed to a chair (including a wheelchair)?: A Little Help needed standing up from a chair using your arms (e.g., wheelchair or bedside chair)?: A Little Help needed to walk in hospital room?: A Little Help needed climbing 3-5 steps with a railing? : A Little 6 Click Score: 19    End  of Session Equipment Utilized During Treatment: Gait belt Activity Tolerance: Patient tolerated treatment well Patient left: in bed;with bed alarm set;with call bell/phone within reach Nurse Communication: Mobility status PT Visit Diagnosis: Muscle weakness (generalized) (M62.81);Difficulty in walking, not elsewhere classified (R26.2);History of falling (Z91.81)     Time: 0102-7253 PT Time Calculation (min) (ACUTE ONLY): 56 min  Charges:  $Gait Training: 8-22 mins $Therapeutic Activity: 38-52 mins                     Jessejames Steelman H. Manson Passey, PT, DPT, NCS 04/05/21, 5:20 PM [phone]

## 2021-04-06 LAB — GLUCOSE, CAPILLARY
Glucose-Capillary: 106 mg/dL — ABNORMAL HIGH (ref 70–99)
Glucose-Capillary: 145 mg/dL — ABNORMAL HIGH (ref 70–99)
Glucose-Capillary: 202 mg/dL — ABNORMAL HIGH (ref 70–99)
Glucose-Capillary: 287 mg/dL — ABNORMAL HIGH (ref 70–99)

## 2021-04-06 LAB — COMPREHENSIVE METABOLIC PANEL
ALT: 42 U/L (ref 0–44)
AST: 52 U/L — ABNORMAL HIGH (ref 15–41)
Albumin: 2.3 g/dL — ABNORMAL LOW (ref 3.5–5.0)
Alkaline Phosphatase: 69 U/L (ref 38–126)
Anion gap: 4 — ABNORMAL LOW (ref 5–15)
BUN: 36 mg/dL — ABNORMAL HIGH (ref 8–23)
CO2: 22 mmol/L (ref 22–32)
Calcium: 8.1 mg/dL — ABNORMAL LOW (ref 8.9–10.3)
Chloride: 109 mmol/L (ref 98–111)
Creatinine, Ser: 1.17 mg/dL (ref 0.61–1.24)
GFR, Estimated: >60 mL/min (ref >60)
Glucose, Bld: 128 mg/dL — ABNORMAL HIGH (ref 70–99)
Potassium: 3.6 mmol/L (ref 3.5–5.1)
Sodium: 135 mmol/L (ref 135–145)
Total Bilirubin: 0.8 mg/dL (ref 0.3–1.2)
Total Protein: 5.3 g/dL — ABNORMAL LOW (ref 6.5–8.1)

## 2021-04-06 LAB — CBC WITH DIFFERENTIAL/PLATELET
Abs Immature Granulocytes: 0.01 10*3/uL (ref 0.00–0.07)
Basophils Absolute: 0 10*3/uL (ref 0.0–0.1)
Basophils Relative: 0 %
Eosinophils Absolute: 0 10*3/uL (ref 0.0–0.5)
Eosinophils Relative: 1 %
HCT: 25.3 % — ABNORMAL LOW (ref 39.0–52.0)
Hemoglobin: 8.6 g/dL — ABNORMAL LOW (ref 13.0–17.0)
Immature Granulocytes: 0 %
Lymphocytes Relative: 17 %
Lymphs Abs: 0.6 10*3/uL — ABNORMAL LOW (ref 0.7–4.0)
MCH: 30.7 pg (ref 26.0–34.0)
MCHC: 34 g/dL (ref 30.0–36.0)
MCV: 90.4 fL (ref 80.0–100.0)
Monocytes Absolute: 0.3 10*3/uL (ref 0.1–1.0)
Monocytes Relative: 10 %
Neutro Abs: 2.5 10*3/uL (ref 1.7–7.7)
Neutrophils Relative %: 72 %
Platelets: 73 10*3/uL — ABNORMAL LOW (ref 150–400)
RBC: 2.8 MIL/uL — ABNORMAL LOW (ref 4.22–5.81)
RDW: 15.2 % (ref 11.5–15.5)
WBC: 3.5 10*3/uL — ABNORMAL LOW (ref 4.0–10.5)
nRBC: 0 % (ref 0.0–0.2)

## 2021-04-06 LAB — C-REACTIVE PROTEIN: CRP: 11.4 mg/dL — ABNORMAL HIGH (ref <1.0)

## 2021-04-06 LAB — CK: Total CK: 345 U/L (ref 49–397)

## 2021-04-06 NOTE — Progress Notes (Addendum)
Physical Therapy Treatment Patient Details Name: Charles Wilkinson MRN: [id number] DOB: 29-Sep-1945 Today's Date: 04/06/2021   History of Present Illness 75 year old male brought to the ED after an unwitnessed fall at his facility.  Positive  COVID-19 encephalopathy and acute rehabdomyolosis. Admitted for IV fluid hydration and IV remdesivir. Pt fell in hosptial requiring stapltes in his scalp. PMH significant for DM II, HTN, hypothyroidsim, hyperlipedemia, NASH and GERD    PT Comments    Pt seen for PT tx with pt agreeable. Pt initially requires min assist for supine>sit with PT providing cuing for hand placement and sequencing with use of bed flat & bed rails. Pt is able to complete sit>stand from various surfaces (EOB, recliner, chair without armrests) with CGA<>supervision throughout session. Pt's daughter arrives & declines practicing hands on training as she reports she does not intend for that to be her role & she hopes to have caregivers for her father. Pt is able to ambulate in room, twice with daughter observing once, with RW & CGA. Discussed home modifications (ex: remove/secure throw rugs, installing bed rails) & use of gait belt to increase safety with mobility. Pt's daughter educated on recommendation of 24 hr supervision but unsure on pulling this together & wishing to talk to case manager to discuss the financial implications of this. At this time pt is not safe to d/c home alone & family is unsure if they can provide 24 hr supervision or not -- due to this, d/c recommendations are being changed to STR as pt would benefit from additional therapy services to maximize independence with functional mobility & reduce fall risk prior to return home. (If family is able to coordinate 24 hr supervision upon d/c pt will may d/c home with HHPT.)     Recommendations for follow up therapy are one component of a multi-disciplinary discharge planning process, led by the attending physician.  Recommendations  may be updated based on patient status, additional functional criteria and insurance authorization.  Follow Up Recommendations  Skilled nursing-short term rehab (<3 hours/day)     Assistance Recommended at Discharge Frequent or constant Supervision/Assistance  Equipment Recommendations  Rolling walker (2 wheels)    Recommendations for Other Services       Precautions / Restrictions Precautions Precautions: Fall Restrictions Weight Bearing Restrictions: No     Mobility  Bed Mobility Overal bed mobility: Needs Assistance Bed Mobility: Sit to Supine;Supine to Sit     Supine to sit: Min assist;Supervision (min assist 1st time, supervision 2nd time, bed flat, use of bed rails) Sit to supine: Supervision (bed flat, use of bed rails)        Transfers Overall transfer level: Needs assistance Equipment used: Rolling walker (2 wheels) Transfers: Sit to/from Stand Sit to Stand: Min guard;Supervision           General transfer comment: sit>stand from EOB, recliner, chair without armrests    Ambulation/Gait Ambulation/Gait assistance: Min guard Gait Distance (Feet): 55 Feet (+ 45 ft) Assistive device: Rolling walker (2 wheels) Gait Pattern/deviations: Decreased step length - right;Decreased step length - left;Decreased stride length Gait velocity: decreased     General Gait Details: Kyphotic posture, RW slightly ahead of BOS, appears to have R trendelenburg gait but pt's daughter reports pt has a LLD that he uses a built up shoe for   Praxair Mobility    Modified Rankin (Stroke Patients Only)  Balance Overall balance assessment: Needs assistance Sitting-balance support: No upper extremity supported;Feet supported Sitting balance-Leahy Scale: Good     Standing balance support: Bilateral upper extremity supported Standing balance-Leahy Scale: Fair                              Cognition Arousal/Alertness:  Awake/alert Behavior During Therapy: Flat affect Overall Cognitive Status: Within Functional Limits for tasks assessed                                 General Comments: initially with delayed response time that improves as session progresses        Exercises      General Comments        Pertinent Vitals/Pain Pain Assessment: Faces Faces Pain Scale: Hurts little more Pain Location: R thigh Pain Descriptors / Indicators: Sore Pain Intervention(s): Limited activity within patient's tolerance;Monitored during session    Home Living                          Prior Function            PT Goals (current goals can now be found in the care plan section) Acute Rehab PT Goals Patient Stated Goal: Go back to his apartment PT Goal Formulation: With patient Time For Goal Achievement: 04/16/21 Potential to Achieve Goals: Good Progress towards PT goals: Progressing toward goals    Frequency    Min 2X/week      PT Plan Discharge plan needs to be updated    Co-evaluation              AM-PAC PT "6 Clicks" Mobility   Outcome Measure  Help needed turning from your back to your side while in a flat bed without using bedrails?: None Help needed moving from lying on your back to sitting on the side of a flat bed without using bedrails?: A Little Help needed moving to and from a bed to a chair (including a wheelchair)?: A Little Help needed standing up from a chair using your arms (e.g., wheelchair or bedside chair)?: A Little Help needed to walk in hospital room?: A Little Help needed climbing 3-5 steps with a railing? : A Little 6 Click Score: 19    End of Session Equipment Utilized During Treatment: Gait belt Activity Tolerance: Patient tolerated treatment well Patient left: in chair;with family/visitor present;with call bell/phone within reach Nurse Communication: Mobility status PT Visit Diagnosis: Muscle weakness (generalized)  (M62.81);Difficulty in walking, not elsewhere classified (R26.2);History of falling (Z91.81)     Time: 1914-7829 PT Time Calculation (min) (ACUTE ONLY): 40 min  Charges:  $Therapeutic Activity: 38-52 mins                     Aleda Grana, PT, DPT 04/06/21, 3:28 PM    Sandi Mariscal 04/06/2021, 3:24 PM

## 2021-04-06 NOTE — [place] FL2 (Signed)
[place] MEDICAID FL2 LEVEL OF CARE SCREENING TOOL     IDENTIFICATION  Patient Name: Charles Wilkinson Birthdate: 1945/07/17 Sex: male Admission Date (Current Location): 04/01/2021  [facility] and [place] Number:  Chiropodist and Address:  [facility], [address], [place], [place] [address]      Provider Number: [id number]  Attending Physician Name and Address:  Charise Killian, MD  Relative Name and Phone Number:  Baylon, Santelli (Daughter)   [phone] [facility])    Current Level of Care: Hospital Recommended Level of Care: Skilled Nursing Facility Prior Approval Number:    Date Approved/Denied:   PASRR Number: [id number]  Discharge Plan:      Current Diagnoses: Patient Active Problem List   Diagnosis Date Noted   Thrombocytopenia (HCC)    Acute pain of left knee    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 04/01/2021   Hyperlipidemia 04/01/2021   Traumatic rhabdomyolysis (HCC)    Fall    Stage 3a chronic kidney disease (HCC)    Type 2 diabetes mellitus with hyperlipidemia (HCC)    Essential hypertension    NASH (nonalcoholic steatohepatitis)    Hypothyroidism     Orientation RESPIRATION BLADDER Height & Weight     Self, Time, Situation, Place  Normal Continent Weight: 200 lb (90.7 kg) Height:  5\' 10"  (177.8 cm)  BEHAVIORAL SYMPTOMS/MOOD NEUROLOGICAL BOWEL NUTRITION STATUS      Continent Diet (Diet renal/carb modified with fluid restriction Diet-HS Snack? Nothing; Fluid restriction: 1200 mL Fluid; Room service appropriate? Yes; Fluid consistency: Thin)  AMBULATORY STATUS COMMUNICATION OF NEEDS Skin   Supervision Verbally Bruising, Other (Comment) (scab on face)                       Personal Care Assistance Level of Assistance  Bathing, Dressing, Feeding Bathing Assistance: Limited assistance Feeding assistance: Independent Dressing Assistance: Limited assistance     Functional Limitations Info              SPECIAL CARE FACTORS FREQUENCY  PT (By licensed PT), OT (By licensed OT)     PT Frequency: 5 times per week OT Frequency: 5 times per week            Contractures      Additional Factors Info  Code Status, Allergies Code Status Info: full Allergies Info: nka           Current Medications (04/06/2021):  This is the current hospital active medication list Current Facility-Administered Medications  Medication Dose Route Frequency Provider Last Rate Last Admin   acetaminophen (TYLENOL) tablet 650 mg  650 mg Oral Q6H PRN 04/08/2021, MD   650 mg at 04/05/21 1113   albuterol (VENTOLIN HFA) 108 (90 Base) MCG/ACT inhaler 2 puff  2 puff Inhalation Q6H Wieting, Richard, MD   2 puff at 04/06/21 1310   ascorbic acid (VITAMIN C) tablet 500 mg  500 mg Oral Daily 04/08/21, MD   500 mg at 04/06/21 0951   cephALEXin (KEFLEX) capsule 500 mg  500 mg Oral Q8H Wieting, Richard, MD   500 mg at 04/06/21 1311   cholecalciferol (VITAMIN D) tablet 2,000 Units  2,000 Units Oral Daily 04/08/21, MD   2,000 Units at 04/06/21 0952   guaiFENesin-dextromethorphan (ROBITUSSIN DM) 100-10 MG/5ML syrup 10 mL  10 mL Oral Q4H PRN 04/08/21, MD   10 mL at 04/05/21 1958   insulin aspart (novoLOG) injection 0-5 Units  0-5 Units Subcutaneous QHS Wieting,  Richard, MD   2 Units at 04/05/21 2051   insulin aspart (novoLOG) injection 0-9 Units  0-9 Units Subcutaneous TID WC Loletha Grayer, MD   1 Units at 04/06/21 1310   insulin glargine-yfgn [facility]) injection 5 Units  5 Units Subcutaneous QHS Loletha Grayer, MD   5 Units at 04/05/21 2051   levothyroxine (SYNTHROID) tablet 175 mcg  175 mcg Oral Daily Loletha Grayer, MD   175 mcg at 04/06/21 [id number]   multivitamin with minerals tablet 1 tablet  1 tablet Oral Daily Loletha Grayer, MD   1 tablet at 04/06/21 [id number]   omega-3 acid ethyl esters (LOVAZA) capsule 1 g  1 g Oral Daily Loletha Grayer, MD   1 g at 04/06/21 0952   ondansetron  (ZOFRAN) tablet 4 mg  4 mg Oral Q6H PRN Loletha Grayer, MD       Or   ondansetron (ZOFRAN) injection 4 mg  4 mg Intravenous Q6H PRN Wieting, Richard, MD       pantoprazole (PROTONIX) EC tablet 40 mg  40 mg Oral BID Loletha Grayer, MD   40 mg at 04/06/21 [id number]   phenol (CHLORASEPTIC) mouth spray 1 spray  1 spray Mouth/Throat PRN Sharion Settler, NP       predniSONE (DELTASONE) tablet 20 mg  20 mg Oral Q breakfast Wyvonnia Dusky, MD   20 mg at 04/06/21 [id number]   traZODone (DESYREL) tablet 25 mg  25 mg Oral QHS Sharion Settler, NP   25 mg at 04/05/21 2001   vitamin E capsule 400 Units  400 Units Oral Daily Loletha Grayer, MD   400 Units at 04/06/21 [id number]   zinc sulfate capsule 220 mg  220 mg Oral Daily Loletha Grayer, MD   220 mg at 04/06/21 [id number]     Discharge Medications: Please see discharge summary for a list of discharge medications.  Relevant Imaging Results:  Relevant Lab Results:   Additional Information SS #: [id number]  [place], LCSW
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
## 2021-04-06 NOTE — TOC Progression Note (Addendum)
Transition of Care [facility]) - Progression Note    Patient Details  Name: Charles Wilkinson MRN: [id number] Date of Birth: December 01, 1945  Transition of Care [facility]) CM/SW [place], LCSW Phone Number: 04/06/2021, 9:55 AM  Clinical Narrative:    According to notes, patient is from [place] (connected with [facility]). CSW left VM for WOM Rep Charles Wilkinson requesting a return call to confirm if patient can return prior to 10 day COVID quarantine per MD request.   12:30- Confirmed with Charles Wilkinson from [facility], patient would not need to quarantine at the hospital prior to DC back to his independent living apartment. Charles Wilkinson can let the ILF know when patient discharges so that they can make sure he quarantines in his apartment. Notified MD.  3:30- Notified by PT that they met with patient and daughter and daughter has questions about 24 hour care. Per PT, if family cannot arrange 24 hour care, SNF would be recommended. PT changed rec to SNF. Called [place] with [facility] who stated patient has been set up for PT, RN, and OT (they do not have an aide on staff but Charles Wilkinson stated their OT and OT Assistant can do what the aide would do). Called daughter Charles Wilkinson. Charles Wilkinson stated she is trying to figure out the exact schedule of when [facility] would come out because they are trying to arrange 24 hour care in the home for patient. Explained that [facility] agency sets this up and asked Charles Wilkinson to call Charles Wilkinson to discuss. Charles Wilkinson stated their goal is for patient to go home with 24 hour care but they are not sure if this can be arranged. She asked about SNF and is interested in SNF work up as a back up plan. She is interested in the [place] area. Explained we would need specifics SNFs as [place] is not in our [place]. She listed [place] and [place]. CSW started SNF work up. Called [place], per [facility] are out of office until Monday or Tuesday, manually faxed referral to [facility], unable to reach anyone or get Admission fax #. Was transferred to a line and it just kept ringing. Charles Wilkinson with [facility] agreed to call Charles Wilkinson to discuss [facility].  Expected Discharge Plan: [place] Barriers to Discharge: Continued Medical Work up  Expected Discharge Plan and Services Expected Discharge Plan: [place]   Discharge Planning Services: CM Consult   Living arrangements for the past 2 months: [place]                                       Social Determinants of Health (SDOH) Interventions    Readmission Risk Interventions No flowsheet data found.

## 2021-04-07 LAB — COMPREHENSIVE METABOLIC PANEL
ALT: 48 U/L — ABNORMAL HIGH (ref 0–44)
AST: 46 U/L — ABNORMAL HIGH (ref 15–41)
Albumin: 2.4 g/dL — ABNORMAL LOW (ref 3.5–5.0)
Alkaline Phosphatase: 80 U/L (ref 38–126)
Anion gap: 8 (ref 5–15)
BUN: 39 mg/dL — ABNORMAL HIGH (ref 8–23)
CO2: 21 mmol/L — ABNORMAL LOW (ref 22–32)
Calcium: 8.5 mg/dL — ABNORMAL LOW (ref 8.9–10.3)
Chloride: 104 mmol/L (ref 98–111)
Creatinine, Ser: 1.12 mg/dL (ref 0.61–1.24)
GFR, Estimated: >60 mL/min (ref >60)
Glucose, Bld: 163 mg/dL — ABNORMAL HIGH (ref 70–99)
Potassium: 3.9 mmol/L (ref 3.5–5.1)
Sodium: 133 mmol/L — ABNORMAL LOW (ref 135–145)
Total Bilirubin: 0.9 mg/dL (ref 0.3–1.2)
Total Protein: 5.7 g/dL — ABNORMAL LOW (ref 6.5–8.1)

## 2021-04-07 LAB — CBC
HCT: 28 % — ABNORMAL LOW (ref 39.0–52.0)
Hemoglobin: 9.4 g/dL — ABNORMAL LOW (ref 13.0–17.0)
MCH: 30.4 pg (ref 26.0–34.0)
MCHC: 33.6 g/dL (ref 30.0–36.0)
MCV: 90.6 fL (ref 80.0–100.0)
Platelets: 106 10*3/uL — ABNORMAL LOW (ref 150–400)
RBC: 3.09 MIL/uL — ABNORMAL LOW (ref 4.22–5.81)
RDW: 15.2 % (ref 11.5–15.5)
WBC: 5.3 10*3/uL (ref 4.0–10.5)
nRBC: 0 % (ref 0.0–0.2)

## 2021-04-07 LAB — GLUCOSE, CAPILLARY
Glucose-Capillary: 144 mg/dL — ABNORMAL HIGH (ref 70–99)
Glucose-Capillary: 230 mg/dL — ABNORMAL HIGH (ref 70–99)
Glucose-Capillary: 271 mg/dL — ABNORMAL HIGH (ref 70–99)
Glucose-Capillary: 266 mg/dL — ABNORMAL HIGH (ref 70–99)
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
## 2021-04-08 LAB — COMPREHENSIVE METABOLIC PANEL
ALT: 41 U/L (ref 0–44)
AST: 38 U/L (ref 15–41)
Albumin: 2.1 g/dL — ABNORMAL LOW (ref 3.5–5.0)
Alkaline Phosphatase: 82 U/L (ref 38–126)
Anion gap: 3 — ABNORMAL LOW (ref 5–15)
BUN: 42 mg/dL — ABNORMAL HIGH (ref 8–23)
CO2: 23 mmol/L (ref 22–32)
Calcium: 8.2 mg/dL — ABNORMAL LOW (ref 8.9–10.3)
Chloride: 108 mmol/L (ref 98–111)
Creatinine, Ser: 1.29 mg/dL — ABNORMAL HIGH (ref 0.61–1.24)
GFR, Estimated: 57 mL/min — ABNORMAL LOW (ref >60)
Glucose, Bld: 155 mg/dL — ABNORMAL HIGH (ref 70–99)
Potassium: 3.7 mmol/L (ref 3.5–5.1)
Sodium: 134 mmol/L — ABNORMAL LOW (ref 135–145)
Total Bilirubin: 0.7 mg/dL (ref 0.3–1.2)
Total Protein: 4.8 g/dL — ABNORMAL LOW (ref 6.5–8.1)

## 2021-04-08 LAB — CBC
HCT: 23.9 % — ABNORMAL LOW (ref 39.0–52.0)
Hemoglobin: 8.1 g/dL — ABNORMAL LOW (ref 13.0–17.0)
MCH: 31.3 pg (ref 26.0–34.0)
MCHC: 33.9 g/dL (ref 30.0–36.0)
MCV: 92.3 fL (ref 80.0–100.0)
Platelets: 105 10*3/uL — ABNORMAL LOW (ref 150–400)
RBC: 2.59 MIL/uL — ABNORMAL LOW (ref 4.22–5.81)
RDW: 15.6 % — ABNORMAL HIGH (ref 11.5–15.5)
WBC: 4 10*3/uL (ref 4.0–10.5)
nRBC: 0 % (ref 0.0–0.2)

## 2021-04-08 LAB — GLUCOSE, CAPILLARY
Glucose-Capillary: 114 mg/dL — ABNORMAL HIGH (ref 70–99)
Glucose-Capillary: 135 mg/dL — ABNORMAL HIGH (ref 70–99)
Glucose-Capillary: 265 mg/dL — ABNORMAL HIGH (ref 70–99)
Glucose-Capillary: 187 mg/dL — ABNORMAL HIGH (ref 70–99)

## 2021-04-08 MED ORDER — OXYCODONE-ACETAMINOPHEN 5-325 MG PO TABS
1.0000 | ORAL_TABLET | Freq: Four times a day (QID) | ORAL | Status: DC | PRN
  Administered 2021-04-08 (×2): 1 via ORAL
  Filled 2021-04-08 (×2): qty 1

## 2021-04-08 NOTE — TOC Progression Note (Addendum)
Transition of Care [facility]) - Progression Note    Patient Details  Name: Garrod Scalzitti MRN: [id number] Date of Birth: [birth year]  Transition of Care [facility]) CM/SW [place], LCSW Phone Number: 04/08/2021, 2:21 PM  Clinical Narrative:  [facility] is unable to accept patient's insurance. Left voicemail for [facility] admissions coordinator.   3:31 pm: Daughter has secured private caregivers for return home but said his caregiver has tested positive for COVID. [facility] confirmed they can accept him for home health PT, OT, and RN at discharge.  Expected Discharge Plan: [place] Barriers to Discharge: Continued Medical Work up  Expected Discharge Plan and Services Expected Discharge Plan: [place]   Discharge Planning Services: CM Consult   Living arrangements for the past 2 months: [place]                                       Social Determinants of Health (SDOH) Interventions    Readmission Risk Interventions No flowsheet data found.
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
## 2021-04-08 NOTE — Progress Notes (Signed)
Physical Therapy Treatment Patient Details Name: Charles Wilkinson MRN: [id number] DOB: 27-Jul-1945 Today's Date: 04/08/2021   History of Present Illness 76 year old male brought to the ED after an unwitnessed fall at his facility.  Positive  COVID-19 encephalopathy and acute rehabdomyolosis. Admitted for IV fluid hydration and IV remdesivir. Pt fell in hospital requiring stapltes in his scalp. PMH significant for DM II, HTN, hypothyroidsim, hyperlipedemia, NASH and GERD    PT Comments    Continued improvement in functional performance this date, completing all sit/stands and gait trials (60' x2) with RW, cga/close sup.  Optimal safety/indep with RW; recommend continued use of RW (versus 4WRW) at discharge.  Daughter arrived mid-way through session; informs patient/therapist that caregivers have been secured and plan is hopeful for discharge home with appropriate support (TOC informed/aware).  Continued discussion about DME that may be helpful in home-daughter with questions about TTB, hospital bed versus wedge pillow.  Answered questions, discussed equipment needs to best of ability.  Reviewed set up and use of TTB (not typically covered by ins), options for hospital bed vs wedge pillow (patient prefers wedge pillow and his own bed).  No further questions; patient/daughter grateful for information.    Recommendations for follow up therapy are one component of a multi-disciplinary discharge planning process, led by the attending physician.  Recommendations may be updated based on patient status, additional functional criteria and insurance authorization.  Follow Up Recommendations  Skilled nursing-short term rehab (<3 hours/day)     Assistance Recommended at Discharge Frequent or constant Supervision/Assistance  Equipment Recommendations  Rolling walker (2 wheels)    Recommendations for Other Services       Precautions / Restrictions Precautions Precautions: Fall Restrictions Weight Bearing  Restrictions: No     Mobility  Bed Mobility               General bed mobility comments: seated in recliner beginning/end of session    Transfers Overall transfer level: Needs assistance Equipment used: Rolling walker (2 wheels) Transfers: Sit to/from Stand Sit to Stand: Supervision;Min guard           General transfer comment: improving carry-over of transfer mechanics; does require use of momentum and multiple attempts to complete, but able to complete without lift assist this date    Ambulation/Gait Ambulation/Gait assistance: Min guard Gait Distance (Feet):  (60 'x2) Assistive device: Rolling walker (2 wheels)         General Gait Details: forward flexed posture, RW arms length ahead of patient (partially corrected with cuing); slow and deliberate gait pattern.  Mild inversion of L LE in loading, maintaining weight on lateral aspect of foot (does partially neutralize with distance).  R trendelenburg/hip weakness increases with fatigue (Educated patient/daughter in sign of fatigue and need for seated rest break when observed)   Stairs             Wheelchair Mobility    Modified Rankin (Stroke Patients Only)       Balance Overall balance assessment: Needs assistance Sitting-balance support: No upper extremity supported;Feet supported Sitting balance-Leahy Scale: Good     Standing balance support: Bilateral upper extremity supported Standing balance-Leahy Scale: Fair                              Cognition Arousal/Alertness: Awake/alert Behavior During Therapy: WFL for tasks assessed/performed Overall Cognitive Status: Within Functional Limits for tasks assessed  Exercises Other Exercises Other Exercises: Sit/stand from recliner, cga/close sup-improved carry-over of mechanics and indep with transfer this date Other Exercises: Daughter arrived mid-way through session; informs  patient/therapist that caregivers have been secured and plan is hopeful for discharge home with appropriate support (TOC informed/aware).  Continued discussion about DME that may be helpful in home-daughter with questions about TTB, hospital bed versus wedge pillow.  Answered questions, discussed equipment needs to best of ability.  No further questions; patient/daughter grateful for information.    General Comments        Pertinent Vitals/Pain Pain Assessment: No/denies pain    Home Living                          Prior Function            PT Goals (current goals can now be found in the care plan section) Acute Rehab PT Goals Patient Stated Goal: Go back to his apartment PT Goal Formulation: With patient Time For Goal Achievement: 04/16/21 Potential to Achieve Goals: Good Progress towards PT goals: Progressing toward goals    Frequency    Min 2X/week      PT Plan Discharge plan needs to be updated    Co-evaluation              AM-PAC PT "6 Clicks" Mobility   Outcome Measure  Help needed turning from your back to your side while in a flat bed without using bedrails?: None Help needed moving from lying on your back to sitting on the side of a flat bed without using bedrails?: A Little Help needed moving to and from a bed to a chair (including a wheelchair)?: A Little Help needed standing up from a chair using your arms (e.g., wheelchair or bedside chair)?: None Help needed to walk in hospital room?: A Little Help needed climbing 3-5 steps with a railing? : A Little 6 Click Score: 20    End of Session Equipment Utilized During Treatment: Gait belt Activity Tolerance: Patient tolerated treatment well Patient left: in chair;with family/visitor present;with call bell/phone within reach Nurse Communication: Mobility status PT Visit Diagnosis: Muscle weakness (generalized) (M62.81);Difficulty in walking, not elsewhere classified (R26.2);History of falling  (Z91.81)     Time: [id number] PT Time Calculation (min) (ACUTE ONLY): 29 min  Charges:  $Gait Training: 8-22 mins $Therapeutic Activity: 8-22 mins                     Kinsler Soeder H. Owens Shark, PT, DPT, NCS 04/08/21, 4:44 PM [phone]

## 2021-04-09 LAB — CBC
HCT: 27.2 % — ABNORMAL LOW (ref 39.0–52.0)
Hemoglobin: 9.2 g/dL — ABNORMAL LOW (ref 13.0–17.0)
MCH: 31.1 pg (ref 26.0–34.0)
MCHC: 33.8 g/dL (ref 30.0–36.0)
MCV: 91.9 fL (ref 80.0–100.0)
Platelets: 137 10*3/uL — ABNORMAL LOW (ref 150–400)
RBC: 2.96 MIL/uL — ABNORMAL LOW (ref 4.22–5.81)
RDW: 15.5 % (ref 11.5–15.5)
WBC: 5 10*3/uL (ref 4.0–10.5)
nRBC: 0 % (ref 0.0–0.2)

## 2021-04-09 LAB — BASIC METABOLIC PANEL
Anion gap: 4 — ABNORMAL LOW (ref 5–15)
BUN: 40 mg/dL — ABNORMAL HIGH (ref 8–23)
CO2: 24 mmol/L (ref 22–32)
Calcium: 8.1 mg/dL — ABNORMAL LOW (ref 8.9–10.3)
Chloride: 106 mmol/L (ref 98–111)
Creatinine, Ser: 1.14 mg/dL (ref 0.61–1.24)
GFR, Estimated: >60 mL/min (ref >60)
Glucose, Bld: 143 mg/dL — ABNORMAL HIGH (ref 70–99)
Potassium: 4.1 mmol/L (ref 3.5–5.1)
Sodium: 134 mmol/L — ABNORMAL LOW (ref 135–145)

## 2021-04-09 LAB — GLUCOSE, CAPILLARY
Glucose-Capillary: 120 mg/dL — ABNORMAL HIGH (ref 70–99)
Glucose-Capillary: 204 mg/dL — ABNORMAL HIGH (ref 70–99)
Glucose-Capillary: 218 mg/dL — ABNORMAL HIGH (ref 70–99)
Glucose-Capillary: 128 mg/dL — ABNORMAL HIGH (ref 70–99)

## 2021-04-09 NOTE — Progress Notes (Signed)
Occupational Therapy Treatment Patient Details Name: Charles Wilkinson MRN: [id number] DOB: [birth year] Today's Date: 04/09/2021   History of present illness 76 year old male brought to the ED after an unwitnessed fall at his facility.  Positive  COVID-19 encephalopathy and acute rehabdomyolosis. Admitted for IV fluid hydration and IV remdesivir. Pt fell in hospital requiring stapltes in his scalp. PMH significant for DM II, HTN, hypothyroidsim, hyperlipedemia, NASH and GERD   OT comments  Pt seem for brief OT tx. Pt in recliner, endorsing fatigue. Pt attempted a transfer from the recliner, with good hand placement on arm rests but initially attempting to get up while scooted back in recliner, requiring VC to shift weight anteriorly to help get his feet underneath him. Pt initial transfer required MIN A to complete, improving to CGA with 2nd transfer. Pt endorsed mild lightheadedness briefly upon standing that resolved quickly. PT in room for additional mobility. Pt progressing towards goals, updated recommendations to [facility] services with 24/7 supv/assist from caregivers to maximize safety and return to PLOF.    Recommendations for follow up therapy are one component of a multi-disciplinary discharge planning process, led by the attending physician.  Recommendations may be updated based on patient status, additional functional criteria and insurance authorization.    Follow Up Recommendations  Home health OT    Assistance Recommended at Discharge Frequent or constant Supervision/Assistance  Patient can return home with the following  A little help with walking and/or transfers;A lot of help with bathing/dressing/bathroom;Assistance with cooking/housework;Direct supervision/assist for medications management;Assist for transportation   Equipment Recommendations  None recommended by OT    Recommendations for Other Services      Precautions / Restrictions Precautions Precautions:  Fall Restrictions Weight Bearing Restrictions: No       Mobility Bed Mobility               General bed mobility comments: in recliner at start of session, ambulating with PT at end of session    Transfers Overall transfer level: Needs assistance Equipment used: Rolling walker (2 wheels) Transfers: Sit to/from Stand Sit to Stand: Min assist;Min guard           General transfer comment: MIN A for initial transfer from recliner, improving to CGA + RW     Balance Overall balance assessment: Needs assistance Sitting-balance support: No upper extremity supported;Feet supported Sitting balance-Leahy Scale: Good     Standing balance support: Bilateral upper extremity supported Standing balance-Leahy Scale: Fair                             ADL either performed or assessed with clinical judgement   ADL Overall ADL's : Needs assistance/impaired                                     Functional mobility during ADLs: Minimal assistance;Min guard;Rolling walker (2 wheels)      Extremity/Trunk Assessment              Vision       Perception     Praxis      Cognition Arousal/Alertness: Awake/alert Behavior During Therapy: WFL for tasks assessed/performed Overall Cognitive Status: Within Functional Limits for tasks assessed  Exercises     Shoulder Instructions       General Comments      Pertinent Vitals/ Pain       Pain Assessment: No/denies pain  Home Living                                          Prior Functioning/Environment              Frequency  Min 2X/week        Progress Toward Goals  OT Goals(current goals can now be found in the care plan section)  Progress towards OT goals: Progressing toward goals  Acute Rehab OT Goals Patient Stated Goal: go home OT Goal Formulation: With patient Time For Goal Achievement:  04/17/21 Potential to Achieve Goals: Good  Plan Discharge plan needs to be updated;Frequency remains appropriate    Co-evaluation                 AM-PAC OT "6 Clicks" Daily Activity     Outcome Measure   Help from another person eating meals?: None Help from another person taking care of personal grooming?: A Little Help from another person toileting, which includes using toliet, bedpan, or urinal?: A Lot Help from another person bathing (including washing, rinsing, drying)?: A Lot Help from another person to put on and taking off regular upper body clothing?: A Little Help from another person to put on and taking off regular lower body clothing?: A Lot 6 Click Score: 16    End of Session Equipment Utilized During Treatment: Rolling walker (2 wheels)  OT Visit Diagnosis: Unsteadiness on feet (R26.81);Other abnormalities of gait and mobility (R26.89);Repeated falls (R29.6)   Activity Tolerance Patient limited by fatigue   Patient Left Other (comment) (ambulating with PT)   Nurse Communication          Time: 5366-4403 OT Time Calculation (min): 8 min  Charges: OT General Charges $OT Visit: 1 Visit OT Treatments $Therapeutic Activity: 8-22 mins  Arman Filter., MPH, MS, OTR/L ascom [phone] 04/09/21, 5:09 PM

## 2021-04-09 NOTE — TOC Progression Note (Signed)
Transition of Care [facility]) - Progression Note    Patient Details  Name: Charles Wilkinson MRN: [id number] Date of Birth: [birth year]  [facility]) CM/SW Contact  Caryn Section, RN Phone Number: 04/09/2021, 4:17 PM  Clinical Narrative:   RNCM left message for daughter on voice mail for discharge planning.    Expected Discharge Plan: Home w Home Health Services Barriers to Discharge: Continued Medical Work up  Expected Discharge Plan and Services Expected Discharge Plan: Home w Home Health Services   Discharge Planning Services: CM Consult   Living arrangements for the past 2 months: Independent Living Facility                                       Social Determinants of Health (SDOH) Interventions    Readmission Risk Interventions No flowsheet data found.

## 2021-04-09 NOTE — Progress Notes (Signed)
Physical Therapy Treatment Patient Details Name: Charles Wilkinson MRN: [id number] DOB: 11/06/1945 Today's Date: 04/09/2021   History of Present Illness 76 year old male brought to the ED after an unwitnessed fall at his facility.  Positive  COVID-19 encephalopathy and acute rehabdomyolosis. Admitted for IV fluid hydration and IV remdesivir. Pt fell in hospital requiring stapltes in his scalp. PMH significant for DM II, HTN, hypothyroidsim, hyperlipedemia, NASH and GERD    PT Comments    Patient just completing OT session upon arrival to room, but agreeable to "lap around the room" with PT before return to bed.  Endorses being up in chair most of the day today, and reports not sleeping well overnight; eager for rest break mid-afternoon. Continues to perform in-room mobility with RW and CGA from therapist; slow and deliberate, but no overt buckling or LOB.  Does endorses in-room mobility with assist from daughter on previous date, feeling comfortable with performance and assist provided by daughter. Issued HEP for use upon discharge; patient voiced understanding of all therex (physically reviewed x5 each to ensure comprehension and ability).  Given continued improvement in functional performance and confirmed availability of appropriate level of assist at discharge (daughter has secured assist through agency), will update discharge recommendations to reflect home with max HH services (PT, OT, RN).  TOC informed/aware.    Recommendations for follow up therapy are one component of a multi-disciplinary discharge planning process, led by the attending physician.  Recommendations may be updated based on patient status, additional functional criteria and insurance authorization.  Follow Up Recommendations  Home health PT     Assistance Recommended at Discharge Frequent or constant Supervision/Assistance  Patient can return home with the following A little help with walking and/or transfers;A little help  with bathing/dressing/bathroom;Assistance with cooking/housework  Equipment Recommendations  Rolling walker (2 wheels)    Recommendations for Other Services       Precautions / Restrictions Precautions Precautions: Fall Restrictions Weight Bearing Restrictions: No     Mobility  Bed Mobility Overal bed mobility: Needs Assistance Bed Mobility: Sit to Supine     Supine to sit: Min assist     General bed mobility comments: assist for LE elevation for return to bed    Transfers Overall transfer level: Needs assistance Equipment used: Rolling walker (2 wheels) Transfers: Sit to/from Stand Sit to Stand: Min guard                Ambulation/Gait Ambulation/Gait assistance: Min guard Gait Distance (Feet): 45 Feet Assistive device: Rolling walker (2 wheels)         General Gait Details: improved position of L foot in loading; maintains L hip/knee in slightly flexed position with decreased stance time.  Slow, deliberate gait pattern with persistent post/lateral weakness to R hip.  No overt buckling or LOB; patient self-terminates distance due to fatigue.   Stairs             Wheelchair Mobility    Modified Rankin (Stroke Patients Only)       Balance Overall balance assessment: Needs assistance Sitting-balance support: No upper extremity supported;Feet supported Sitting balance-Leahy Scale: Good     Standing balance support: Bilateral upper extremity supported Standing balance-Leahy Scale: Fair                              Cognition Arousal/Alertness: Awake/alert Behavior During Therapy: WFL for tasks assessed/performed Overall Cognitive Status: Within Functional Limits for tasks assessed  Exercises Other Exercises Other Exercises: Issued and reviewed HEP for use outside therapy/upon discharge-including seated UE/LE therex; performed x5 each to ensure understanding of each  therex.  Patient voiced understanding of all therex.  Also reviewed use of MedBridge for assist with program; patient voiced understanding.    General Comments        Pertinent Vitals/Pain Pain Assessment: No/denies pain    Home Living                          Prior Function            PT Goals (current goals can now be found in the care plan section) Acute Rehab PT Goals Patient Stated Goal: Go back to his apartment PT Goal Formulation: With patient Time For Goal Achievement: 04/16/21 Potential to Achieve Goals: Good Progress towards PT goals: Progressing toward goals    Frequency    Min 2X/week      PT Plan Discharge plan needs to be updated    Co-evaluation              AM-PAC PT "6 Clicks" Mobility   Outcome Measure  Help needed turning from your back to your side while in a flat bed without using bedrails?: None Help needed moving from lying on your back to sitting on the side of a flat bed without using bedrails?: A Little Help needed moving to and from a bed to a chair (including a wheelchair)?: A Little Help needed standing up from a chair using your arms (e.g., wheelchair or bedside chair)?: A Little Help needed to walk in hospital room?: A Little Help needed climbing 3-5 steps with a railing? : A Little 6 Click Score: 19    End of Session Equipment Utilized During Treatment: Gait belt Activity Tolerance: Patient tolerated treatment well Patient left: in bed;with call bell/phone within reach;with bed alarm set Nurse Communication: Mobility status PT Visit Diagnosis: Muscle weakness (generalized) (M62.81);Difficulty in walking, not elsewhere classified (R26.2);History of falling (Z91.81)     Time: 7169-6789 PT Time Calculation (min) (ACUTE ONLY): 24 min  Charges:  $Gait Training: 8-22 mins $Therapeutic Exercise: 8-22 mins                     Aoife Bold H. Manson Passey, PT, DPT, NCS 04/09/21, 10:55 PM [phone]
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
## 2021-04-10 DIAGNOSIS — R531 Weakness: Secondary | ICD-10-CM

## 2021-04-10 DIAGNOSIS — T796XXS Traumatic ischemia of muscle, sequela: Secondary | ICD-10-CM

## 2021-04-10 LAB — CBC
HCT: 25.1 % — ABNORMAL LOW (ref 39.0–52.0)
Hemoglobin: 8.4 g/dL — ABNORMAL LOW (ref 13.0–17.0)
MCH: 30.5 pg (ref 26.0–34.0)
MCHC: 33.5 g/dL (ref 30.0–36.0)
MCV: 91.3 fL (ref 80.0–100.0)
Platelets: 112 10*3/uL — ABNORMAL LOW (ref 150–400)
RBC: 2.75 MIL/uL — ABNORMAL LOW (ref 4.22–5.81)
RDW: 15.6 % — ABNORMAL HIGH (ref 11.5–15.5)
WBC: 3.4 10*3/uL — ABNORMAL LOW (ref 4.0–10.5)
nRBC: 0 % (ref 0.0–0.2)

## 2021-04-10 LAB — GLUCOSE, CAPILLARY
Glucose-Capillary: 112 mg/dL — ABNORMAL HIGH (ref 70–99)
Glucose-Capillary: 200 mg/dL — ABNORMAL HIGH (ref 70–99)

## 2021-04-10 LAB — BASIC METABOLIC PANEL
Anion gap: 7 (ref 5–15)
BUN: 37 mg/dL — ABNORMAL HIGH (ref 8–23)
CO2: 24 mmol/L (ref 22–32)
Calcium: 8.1 mg/dL — ABNORMAL LOW (ref 8.9–10.3)
Chloride: 105 mmol/L (ref 98–111)
Creatinine, Ser: 1.13 mg/dL (ref 0.61–1.24)
GFR, Estimated: >60 mL/min (ref >60)
Glucose, Bld: 113 mg/dL — ABNORMAL HIGH (ref 70–99)
Potassium: 3.9 mmol/L (ref 3.5–5.1)
Sodium: 136 mmol/L (ref 135–145)

## 2021-04-10 MED ORDER — ALBUTEROL SULFATE HFA 108 (90 BASE) MCG/ACT IN AERS: 2.0000 | INHALATION_SPRAY | Freq: Four times a day (QID) | RESPIRATORY_TRACT | 0 refills | Status: AC | PRN

## 2021-04-10 MED ORDER — ACETAMINOPHEN 325 MG PO TABS: 650.0000 mg | ORAL_TABLET | Freq: Four times a day (QID) | ORAL | Status: DC | PRN

## 2021-04-10 MED ORDER — ZINC SULFATE 220 (50 ZN) MG PO CAPS: 220.0000 mg | ORAL_CAPSULE | Freq: Every day | ORAL | 0 refills | Status: AC

## 2021-04-10 NOTE — TOC Progression Note (Addendum)
Transition of Care [facility]) - Progression Note    Patient Details  Name: Charles Wilkinson MRN: [id number] Date of Birth: 1945/08/22  [facility] CM/SW [place], RN Phone Number: 04/10/2021, 1:04 PM  Clinical Narrative:  Daughter contacted RNCM, stating that she would like Home Health set up for patient, RNCM confirmed with Sharmon Revere at [facility] will accept patient under their services, as discussed 12/29.  Malachy Mood states she has spoken to patient's daughter regarding home health.  Patient had rolling walker delivered to room last week, daughter acknowledges receipt of walker.  Daughter states patient was due to be discharged today, Dr. Leslye Peer will see patient and contact daughter.    Addendum 1427:  Dr. Leslye Peer has discharged patient, daughter plans to have a driver transport patient home.  Declines further TOC needs.  Expected Discharge Plan: [place] Barriers to Discharge: Continued Medical Work up  Expected Discharge Plan and Services Expected Discharge Plan: [place]   Discharge Planning Services: CM Consult   Living arrangements for the past 2 months: [place]                                       Social Determinants of Health (SDOH) Interventions    Readmission Risk Interventions No flowsheet data found.
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
## 2021-04-10 NOTE — Care Management Important Message (Signed)
Important Message  Patient Details  Name: Charles Wilkinson MRN: [id number] Date of Birth: January 18, 1946   Medicare Important Message Given:  Yes  Patient is in an isolation room so I reviewed the Important Message from Medicare by phone [phone]). He is in agreement with the discharge plan and I asked if he would like a copy and he replied no as I had previously gave him on 04/05/21. I wished him a speedy recovery and thanked him for his time.  Olegario Messier A Varonica Siharath 04/10/2021, 1:36 PM

## 2021-04-10 NOTE — Progress Notes (Signed)
Patient with trauma head wound from fall with 3 staples.  Per order, 3 staples removed wihtout difficulty, scant amount of bleeding post removal and patient tolerated procedure without complaints.  Patient ready for discharge home.  Will continue to monitor

## 2021-07-12 ENCOUNTER — Emergency Department: Payer: Medicare Other

## 2021-07-12 ENCOUNTER — Other Ambulatory Visit: Payer: Self-pay

## 2021-07-12 ENCOUNTER — Observation Stay
Admission: EM | Admit: 2021-07-12 | Discharge: 2021-07-13 | Disposition: A | Discharge status: Home Health | Payer: Medicare Other | Attending: Internal Medicine | Admitting: Internal Medicine

## 2021-07-12 DIAGNOSIS — W06XXXA Fall from bed, initial encounter: Secondary | ICD-10-CM | POA: Diagnosis not present

## 2021-07-12 DIAGNOSIS — Z87891 Personal history of nicotine dependence: Secondary | ICD-10-CM | POA: Diagnosis not present

## 2021-07-12 DIAGNOSIS — E785 Hyperlipidemia, unspecified: Secondary | ICD-10-CM | POA: Diagnosis present

## 2021-07-12 DIAGNOSIS — E039 Hypothyroidism, unspecified: Secondary | ICD-10-CM | POA: Diagnosis present

## 2021-07-12 DIAGNOSIS — Z794 Long term (current) use of insulin: Secondary | ICD-10-CM | POA: Insufficient documentation

## 2021-07-12 DIAGNOSIS — I6381 Other cerebral infarction due to occlusion or stenosis of small artery: Secondary | ICD-10-CM | POA: Diagnosis not present

## 2021-07-12 DIAGNOSIS — Y92009 Unspecified place in unspecified non-institutional (private) residence as the place of occurrence of the external cause: Secondary | ICD-10-CM | POA: Insufficient documentation

## 2021-07-12 DIAGNOSIS — N1831 Chronic kidney disease, stage 3a: Secondary | ICD-10-CM | POA: Diagnosis not present

## 2021-07-12 DIAGNOSIS — E1122 Type 2 diabetes mellitus with diabetic chronic kidney disease: Secondary | ICD-10-CM | POA: Diagnosis not present

## 2021-07-12 DIAGNOSIS — I129 Hypertensive chronic kidney disease with stage 1 through stage 4 chronic kidney disease, or unspecified chronic kidney disease: Secondary | ICD-10-CM | POA: Insufficient documentation

## 2021-07-12 DIAGNOSIS — R2681 Unsteadiness on feet: Secondary | ICD-10-CM | POA: Diagnosis not present

## 2021-07-12 DIAGNOSIS — I639 Cerebral infarction, unspecified: Secondary | ICD-10-CM | POA: Diagnosis present

## 2021-07-12 DIAGNOSIS — Z79899 Other long term (current) drug therapy: Secondary | ICD-10-CM | POA: Insufficient documentation

## 2021-07-12 DIAGNOSIS — Z7984 Long term (current) use of oral hypoglycemic drugs: Secondary | ICD-10-CM | POA: Insufficient documentation

## 2021-07-12 DIAGNOSIS — S0083XA Contusion of other part of head, initial encounter: Secondary | ICD-10-CM | POA: Diagnosis not present

## 2021-07-12 DIAGNOSIS — R296 Repeated falls: Secondary | ICD-10-CM | POA: Insufficient documentation

## 2021-07-12 DIAGNOSIS — Z8616 Personal history of COVID-19: Secondary | ICD-10-CM | POA: Diagnosis not present

## 2021-07-12 DIAGNOSIS — K7581 Nonalcoholic steatohepatitis (NASH): Secondary | ICD-10-CM | POA: Diagnosis present

## 2021-07-12 DIAGNOSIS — Z20822 Contact with and (suspected) exposure to covid-19: Secondary | ICD-10-CM | POA: Insufficient documentation

## 2021-07-12 DIAGNOSIS — W19XXXA Unspecified fall, initial encounter: Secondary | ICD-10-CM | POA: Diagnosis not present

## 2021-07-12 DIAGNOSIS — D61818 Other pancytopenia: Secondary | ICD-10-CM | POA: Diagnosis present

## 2021-07-12 DIAGNOSIS — N179 Acute kidney failure, unspecified: Secondary | ICD-10-CM | POA: Diagnosis present

## 2021-07-12 DIAGNOSIS — I10 Essential (primary) hypertension: Secondary | ICD-10-CM | POA: Diagnosis not present

## 2021-07-12 DIAGNOSIS — E1129 Type 2 diabetes mellitus with other diabetic kidney complication: Secondary | ICD-10-CM | POA: Diagnosis present

## 2021-07-12 LAB — URINALYSIS, COMPLETE (UACMP) WITH MICROSCOPIC
Bacteria, UA: NONE SEEN
Bilirubin Urine: NEGATIVE
Glucose, UA: NEGATIVE mg/dL
Hgb urine dipstick: NEGATIVE
Ketones, ur: NEGATIVE mg/dL
Leukocytes,Ua: NEGATIVE
Nitrite: NEGATIVE
Protein, ur: 30 mg/dL — AB
Specific Gravity, Urine: 1.011 (ref 1.005–1.030)
Squamous Epithelial / HPF: NONE SEEN (ref 0–5)
pH: 6 (ref 5.0–8.0)

## 2021-07-12 LAB — COMPREHENSIVE METABOLIC PANEL
ALT: 16 U/L (ref 0–44)
AST: 22 U/L (ref 15–41)
Albumin: 3 g/dL — ABNORMAL LOW (ref 3.5–5.0)
Alkaline Phosphatase: 82 U/L (ref 38–126)
Anion gap: 10 (ref 5–15)
BUN: 36 mg/dL — ABNORMAL HIGH (ref 8–23)
CO2: 21 mmol/L — ABNORMAL LOW (ref 22–32)
Calcium: 8.7 mg/dL — ABNORMAL LOW (ref 8.9–10.3)
Chloride: 104 mmol/L (ref 98–111)
Creatinine, Ser: 1.6 mg/dL — ABNORMAL HIGH (ref 0.61–1.24)
GFR, Estimated: 44 mL/min — ABNORMAL LOW (ref >60)
Glucose, Bld: 233 mg/dL — ABNORMAL HIGH (ref 70–99)
Potassium: 4.4 mmol/L (ref 3.5–5.1)
Sodium: 135 mmol/L (ref 135–145)
Total Bilirubin: 1.1 mg/dL (ref 0.3–1.2)
Total Protein: 6.5 g/dL (ref 6.5–8.1)

## 2021-07-12 LAB — CBC
HCT: 30.1 % — ABNORMAL LOW (ref 39.0–52.0)
Hemoglobin: 9.5 g/dL — ABNORMAL LOW (ref 13.0–17.0)
MCH: 27.4 pg (ref 26.0–34.0)
MCHC: 31.6 g/dL (ref 30.0–36.0)
MCV: 86.7 fL (ref 80.0–100.0)
Platelets: 96 10*3/uL — ABNORMAL LOW (ref 150–400)
RBC: 3.47 MIL/uL — ABNORMAL LOW (ref 4.22–5.81)
RDW: 16 % — ABNORMAL HIGH (ref 11.5–15.5)
WBC: 3.8 10*3/uL — ABNORMAL LOW (ref 4.0–10.5)
nRBC: 0 % (ref 0.0–0.2)

## 2021-07-12 LAB — PROTIME-INR
INR: 1.1 (ref 0.8–1.2)
Prothrombin Time: 13.9 seconds (ref 11.4–15.2)

## 2021-07-12 LAB — GLUCOSE, CAPILLARY
Glucose-Capillary: 176 mg/dL — ABNORMAL HIGH (ref 70–99)
Glucose-Capillary: 183 mg/dL — ABNORMAL HIGH (ref 70–99)

## 2021-07-12 LAB — AMMONIA: Ammonia: 29 umol/L (ref 9–35)

## 2021-07-12 LAB — APTT: aPTT: 33 seconds (ref 24–36)

## 2021-07-12 LAB — RESP PANEL BY RT-PCR (FLU A&B, COVID) ARPGX2
Influenza A by PCR: NEGATIVE
Influenza B by PCR: NEGATIVE
SARS Coronavirus 2 by RT PCR: NEGATIVE

## 2021-07-12 LAB — TROPONIN I (HIGH SENSITIVITY): Troponin I (High Sensitivity): 5 ng/L (ref <18)

## 2021-07-12 IMAGING — MR MR MRA HEAD W/O CM
1 series · 26 of 48 positions shown · non-contrast
Comparison: Head CT earlier same day

CLINICAL DATA: Neuro deficit, acute, stroke suspected. Fell from
bed. Confusion.



[Series 18: TOF · axial · 0.5mm · 0.48mm/px · z∈[-53,+44]mm · 26 of 217 slices shown]
[im 1/217]
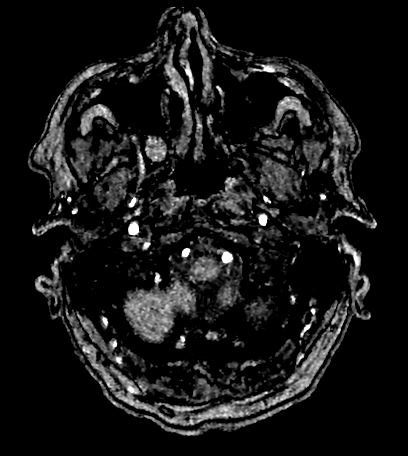
[im 5/217]
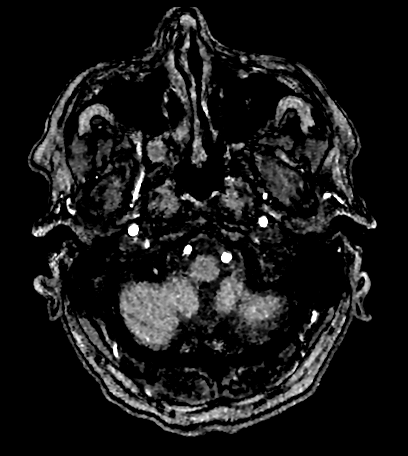
[im 10/217]
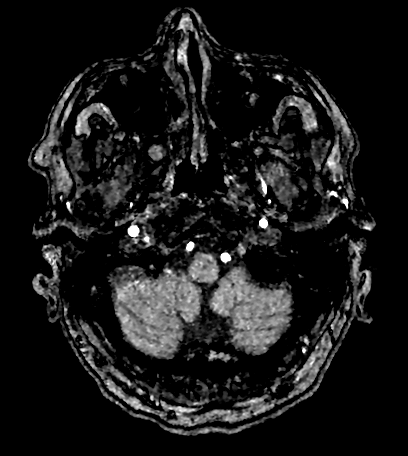
[im 14/217]
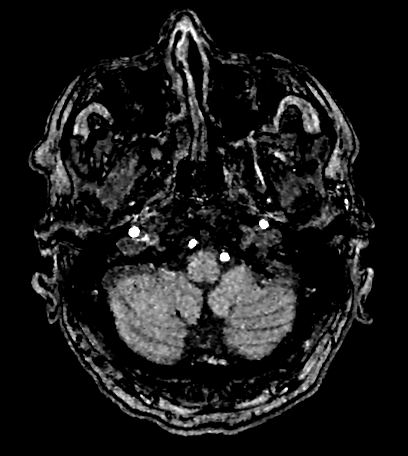
[im 19/217]
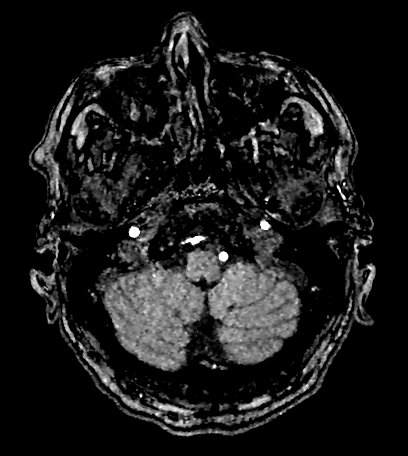
[im 23/217]
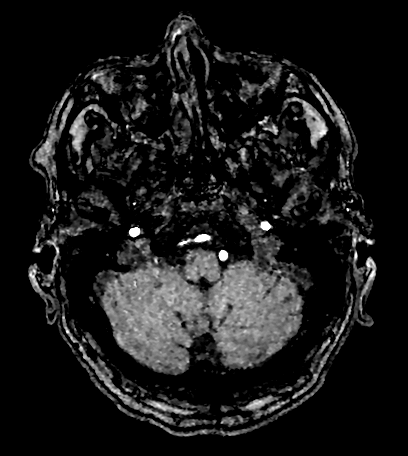
[im 28/217]
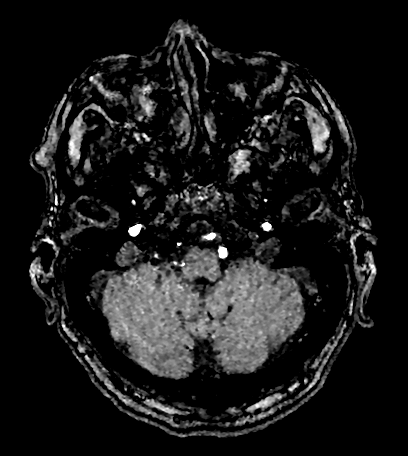
[im 33/217]
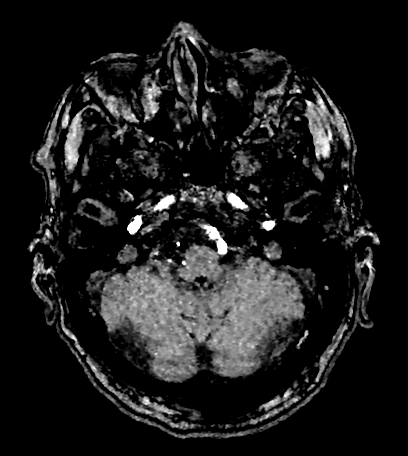
[im 37/217]
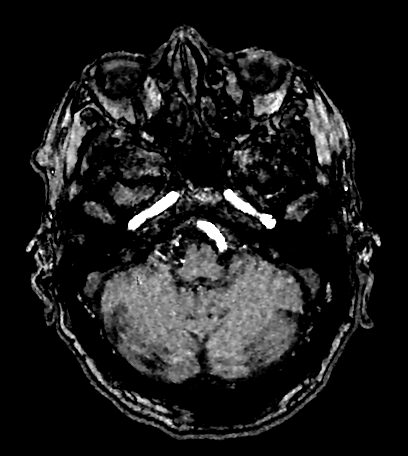
[im 42/217]
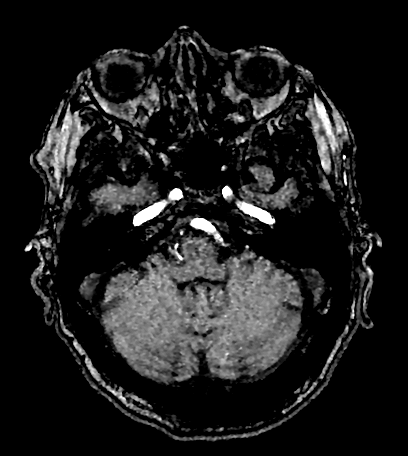
[im 46/217]
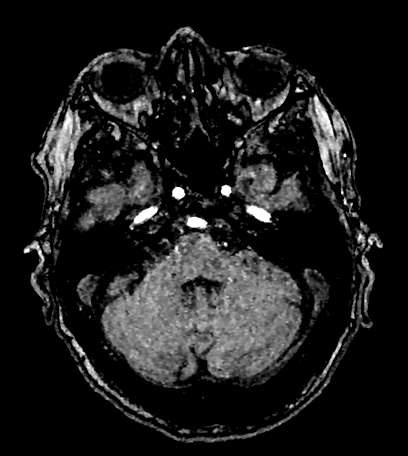
[im 51/217]
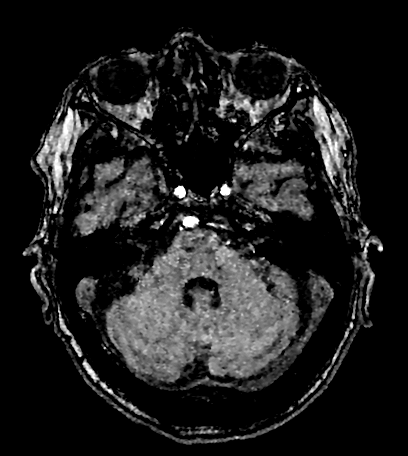
[im 56/217]
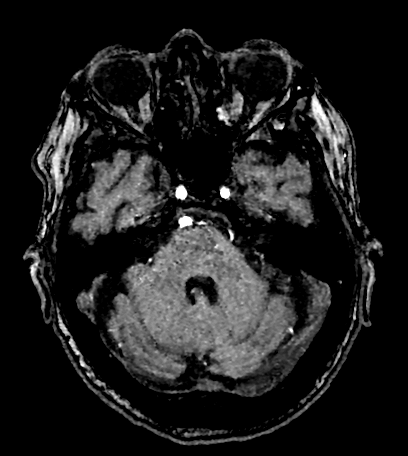
[im 60/217]
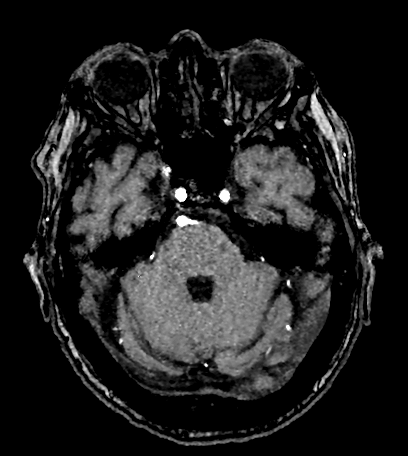
[im 65/217]
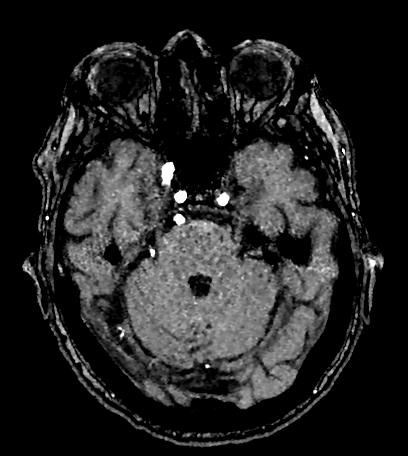
[im 69/217]
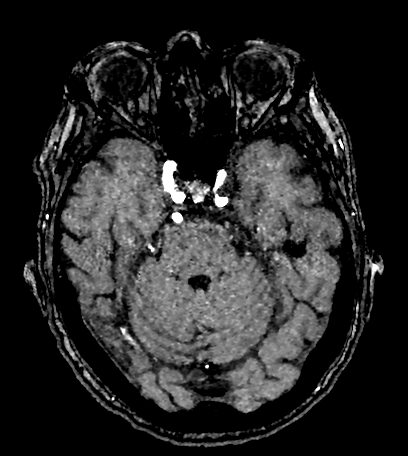
[im 74/217]
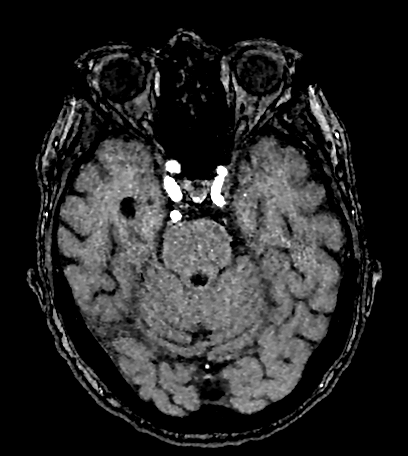
[im 79/217]
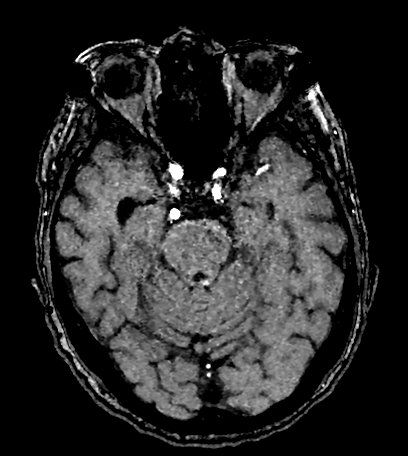
[im 83/217]
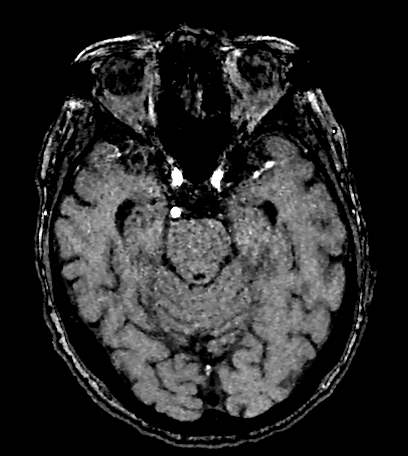
[im 97/217]
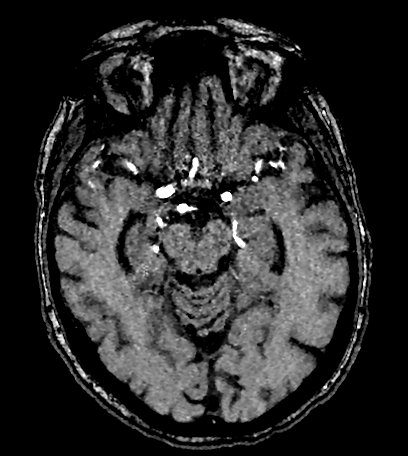
[im 111/217]
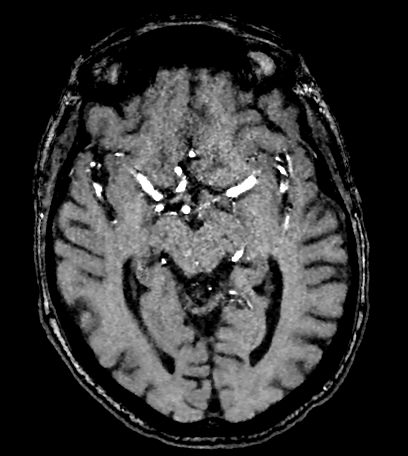
[im 125/217]
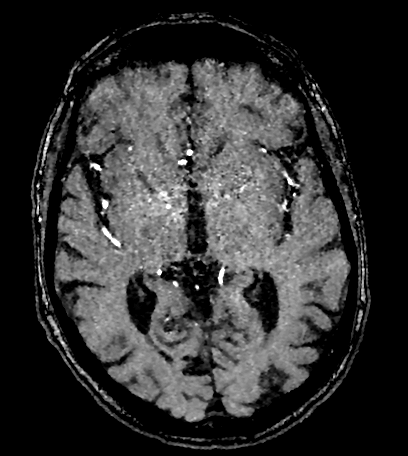
[im 152/217]
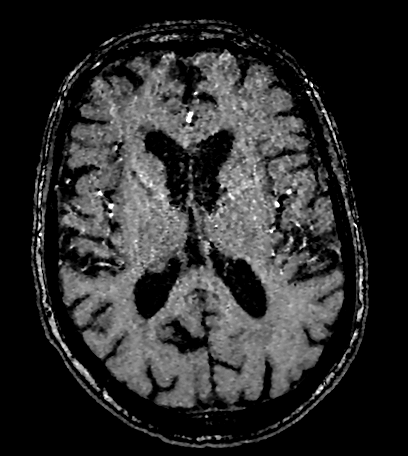
[im 180/217]
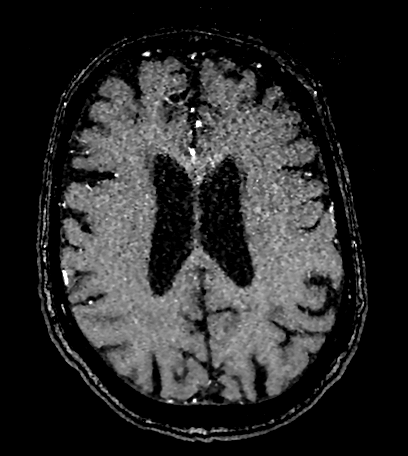
[im 184/217]
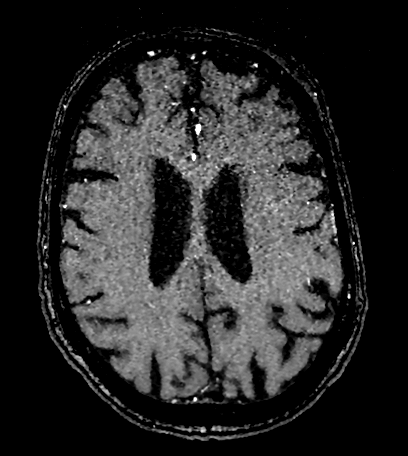
[im 207/217]
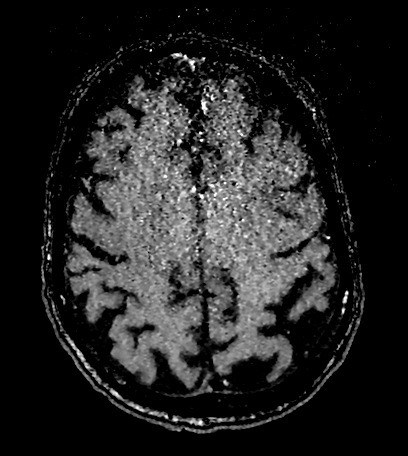

[26 of 48 positions shown; findings below may reference images not displayed]

FINDINGS: MRI HEAD FINDINGS

Brain: Diffusion imaging shows a punctate acute infarction at the
cortical surface of the right parietooccipital junction. There is a
1 cm acute infarction within the left caudate/anterior limb internal
capsule. No large vessel territory infarction. Chronic small-vessel
ischemic changes affect the cerebral hemispheric white matter,
moderate in degree. No focal abnormality affects the brainstem or
cerebellum. No hydrocephalus, hemorrhage or extra-axial collection.

Vascular: Major vessels at the base of the brain show flow.

Skull and upper cervical spine: Negative

Sinuses/Orbits: Clear/normal

Other: None

MRA HEAD FINDINGS

Both internal carotid arteries are patent through the skull base and
siphon regions. The anterior and middle cerebral vessels are patent.
Diminutive left A1 segment, probably congenital. No sign of large
vessel infarction. No aneurysm or vascular malformation.

Both vertebral arteries are widely patent to the basilar. No basilar
stenosis. Posterior circulation branch vessels show flow.

MRA NECK FINDINGS

Both common carotid arteries are patent to the bifurcation level. No
flow limiting stenosis suspected at either carotid bifurcation.
Cervical internal carotid arteries show flow. Antegrade flow is
present within both vertebral arteries, the left being dominant.
IMPRESSION: Punctate acute cortical infarction at the right parietooccipital
junction.

1 cm acute infarction affecting the left caudate/anterior limb
internal capsule. No evidence of hemorrhage in either location.

Moderate chronic small-vessel ischemic changes elsewhere within the
cerebral hemispheric white matter.

MR angiography done without contrast of the neck and head does not
show any stenosis at either carotid bifurcation or any intracranial
large vessel occlusion or correctable proximal stenosis. Diminutive
left A1 segment, favored to be congenital.

## 2021-07-12 IMAGING — MR MR MRA NECK W/O CM
4 series · 39 of 48 positions shown · non-contrast
Comparison: Head CT earlier same day

CLINICAL DATA: Neuro deficit, acute, stroke suspected. Fell from
bed. Confusion.



[Series 27: TOF post-contrast · axial · non-contrast · 0.8mm · 0.52mm/px · z∈[-277,-25]mm · 35 of 333 slices shown]
[im 1/333]
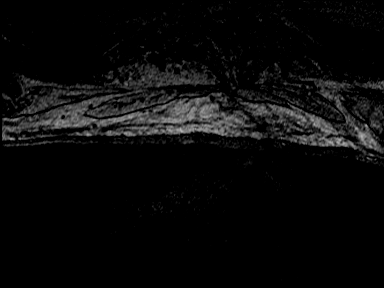
[im 8/333]
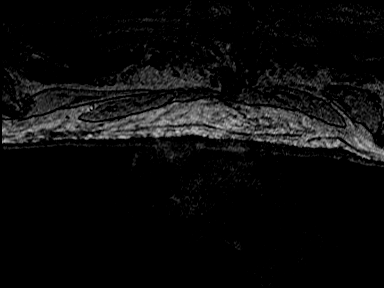
[im 16/333]
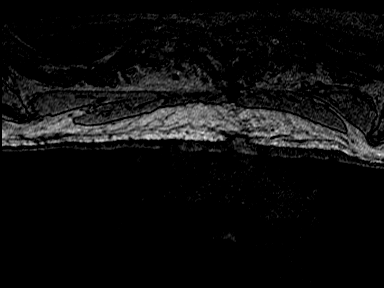
[im 24/333]
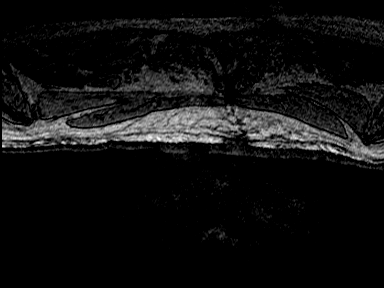
[im 31/333]
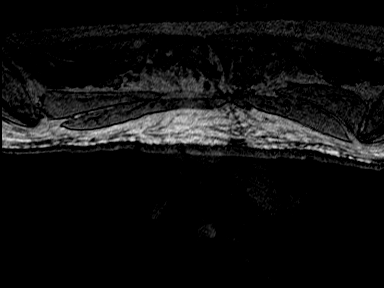
[im 39/333]
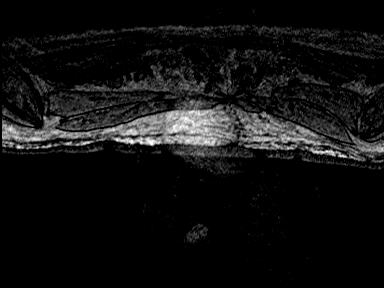
[im 47/333]
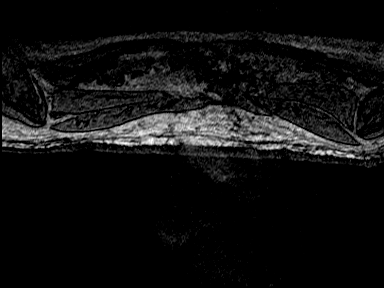
[im 55/333]
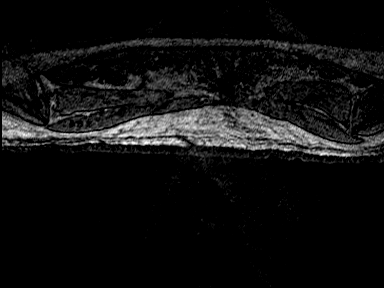
[im 62/333]
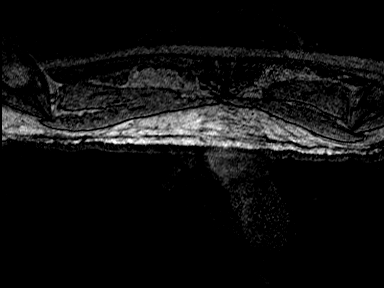
[im 70/333]
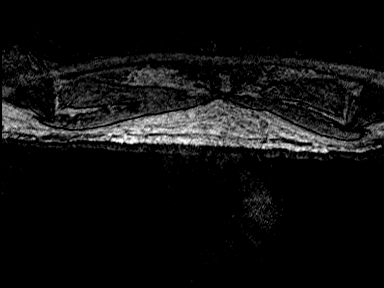
[im 78/333]
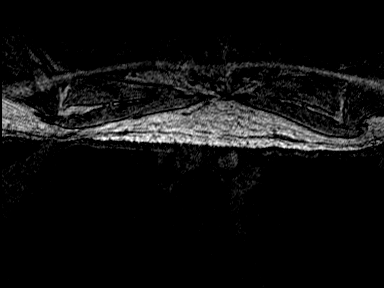
[im 85/333]
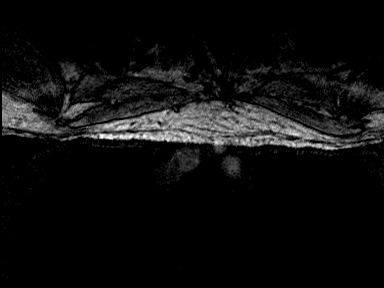
[im 93/333]
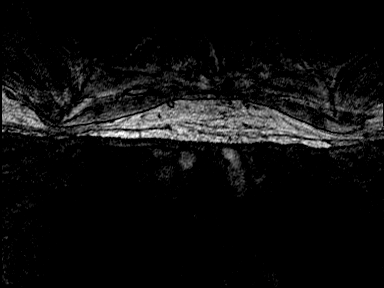
[im 101/333]
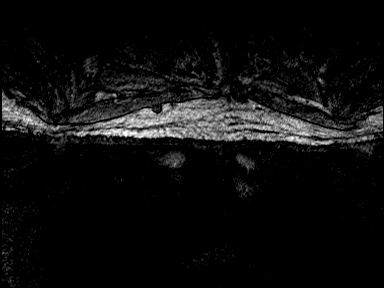
[im 109/333]
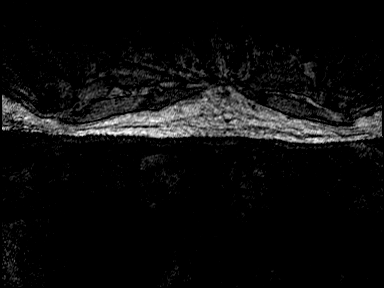
[im 116/333]
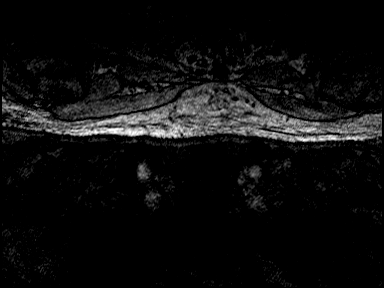
[im 124/333]
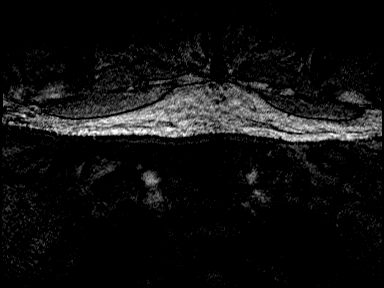
[im 132/333]
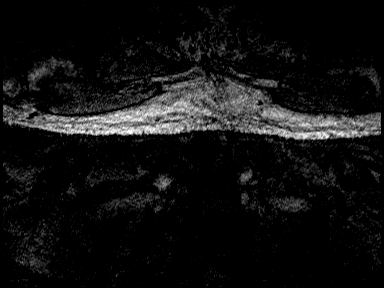
[im 139/333]
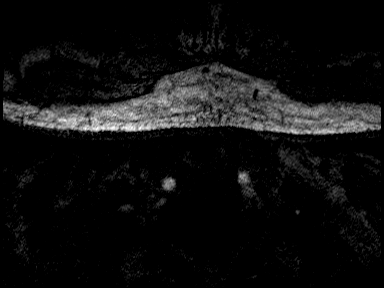
[im 147/333]
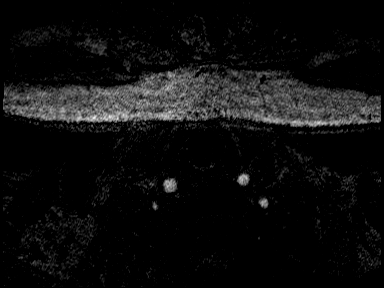
[im 155/333]
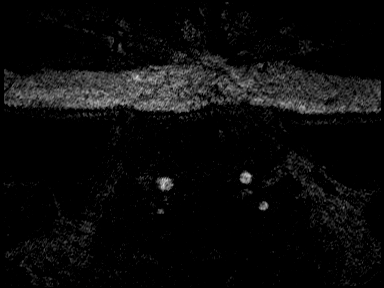
[im 163/333]
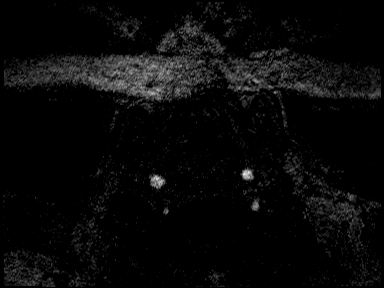
[im 170/333]
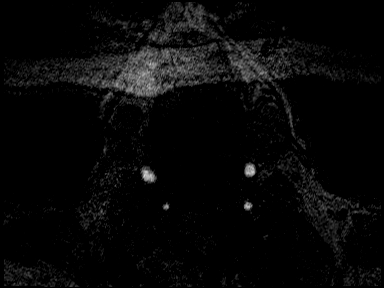
[im 178/333]
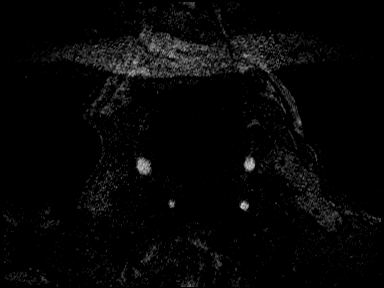
[im 186/333]
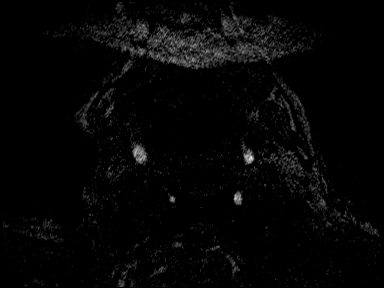
[im 194/333]
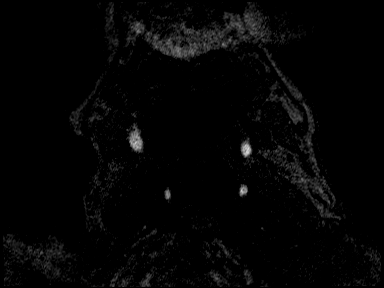
[im 201/333]
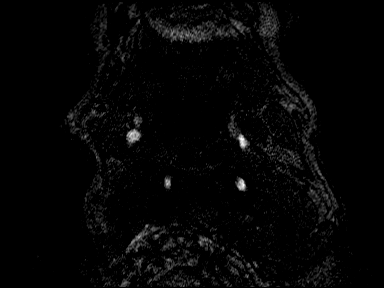
[im 209/333]
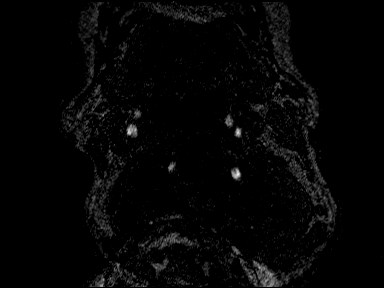
[im 217/333]
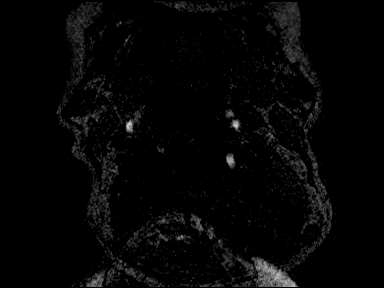
[im 224/333]
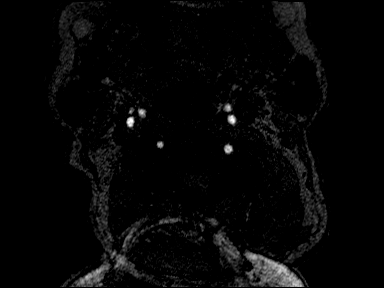
[im 232/333]
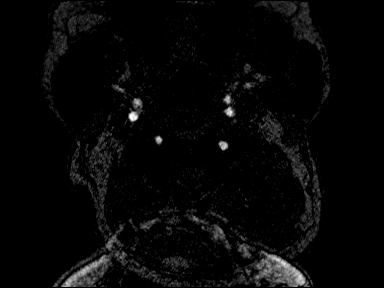
[im 240/333]
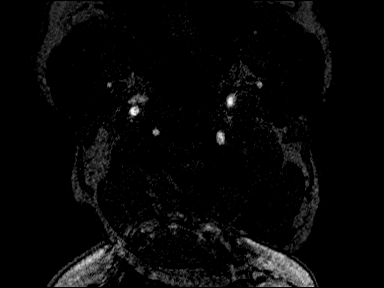
[im 271/333]
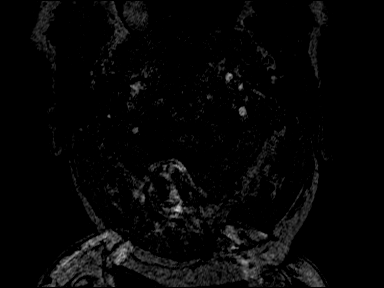
[im 278/333]
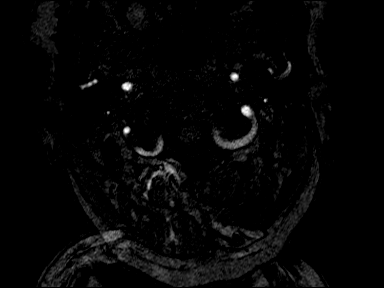
[im 317/333]
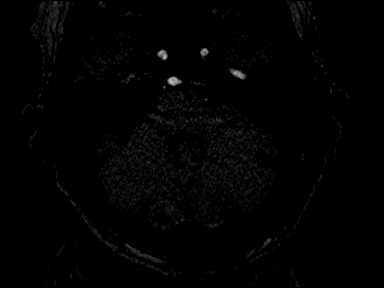

[Series 1069: carotid rotate · sagittal · 364.3mm · 0.47mm/px · 1 of 3 slices shown]
[im 1/3]
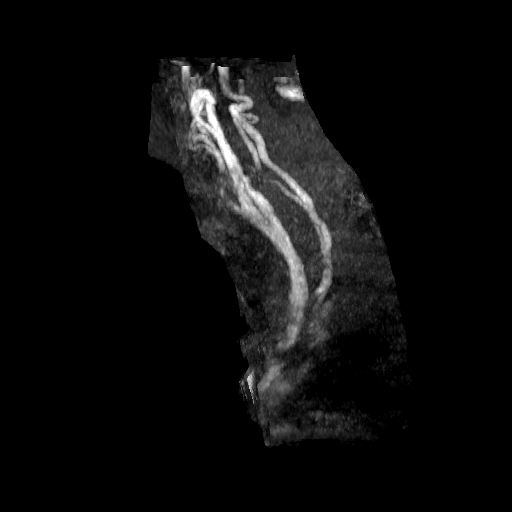

[Series 1081: rcca · sagittal · 364.3mm · 0.43mm/px · 1 of 9 slices shown]
[im 1/9]
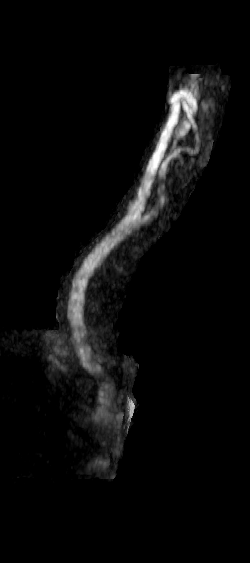

[Series 1085: lcca · sagittal · 364.3mm · 0.47mm/px · 2 of 14 slices shown]
[im 1/14]
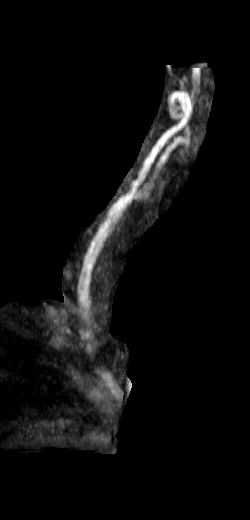
[im 14/14]
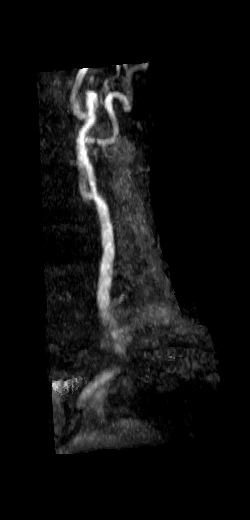

[39 of 48 positions shown; findings below may reference images not displayed]

FINDINGS: MRI HEAD FINDINGS

Brain: Diffusion imaging shows a punctate acute infarction at the
cortical surface of the right parietooccipital junction. There is a
1 cm acute infarction within the left caudate/anterior limb internal
capsule. No large vessel territory infarction. Chronic small-vessel
ischemic changes affect the cerebral hemispheric white matter,
moderate in degree. No focal abnormality affects the brainstem or
cerebellum. No hydrocephalus, hemorrhage or extra-axial collection.

Vascular: Major vessels at the base of the brain show flow.

Skull and upper cervical spine: Negative

Sinuses/Orbits: Clear/normal

Other: None

MRA HEAD FINDINGS

Both internal carotid arteries are patent through the skull base and
siphon regions. The anterior and middle cerebral vessels are patent.
Diminutive left A1 segment, probably congenital. No sign of large
vessel infarction. No aneurysm or vascular malformation.

Both vertebral arteries are widely patent to the basilar. No basilar
stenosis. Posterior circulation branch vessels show flow.

MRA NECK FINDINGS

Both common carotid arteries are patent to the bifurcation level. No
flow limiting stenosis suspected at either carotid bifurcation.
Cervical internal carotid arteries show flow. Antegrade flow is
present within both vertebral arteries, the left being dominant.
IMPRESSION: Punctate acute cortical infarction at the right parietooccipital
junction.

1 cm acute infarction affecting the left caudate/anterior limb
internal capsule. No evidence of hemorrhage in either location.

Moderate chronic small-vessel ischemic changes elsewhere within the
cerebral hemispheric white matter.

MR angiography done without contrast of the neck and head does not
show any stenosis at either carotid bifurcation or any intracranial
large vessel occlusion or correctable proximal stenosis. Diminutive
left A1 segment, favored to be congenital.

## 2021-07-12 IMAGING — MR MR HEAD W/O CM
15 of 16 series · 40 of 48 positions shown · non-contrast
Comparison: Head CT earlier same day

CLINICAL DATA: Neuro deficit, acute, stroke suspected. Fell from
bed. Confusion.



[Series 5: ax dwi_tracew · axial · 3.0mm · 0.71mm/px · z∈[-76,+87]mm · 3 of 112 slices shown]
[im 1/112]
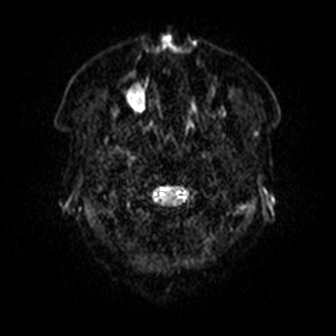
[im 56/112]
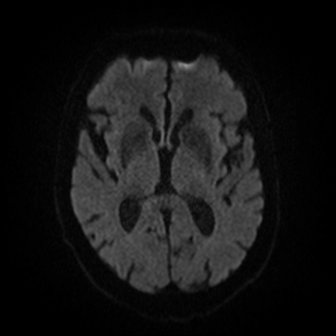
[im 112/112]
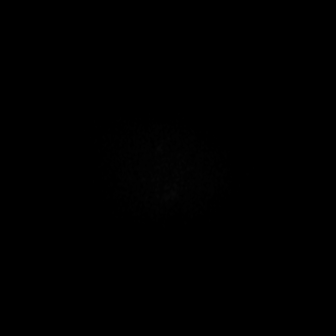

[Series 7: cor dwi_tracew · coronal · 5.0mm · 0.68mm/px · 1 of 42 slices shown]
[im 1/42]
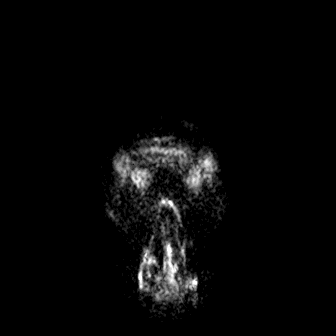

[Series 8: cor dwi_adc · coronal · 5.0mm · 0.68mm/px · 2 of 42 slices shown]
[im 1/42]
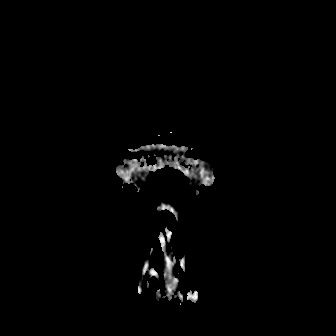
[im 42/42]
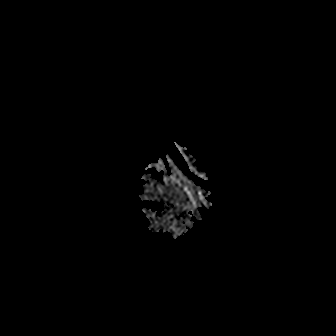

[Series 9: T1 · sagittal · 5.0mm · 0.47mm/px · 1 of 24 slices shown (1 of 2)]
[im 1/24]
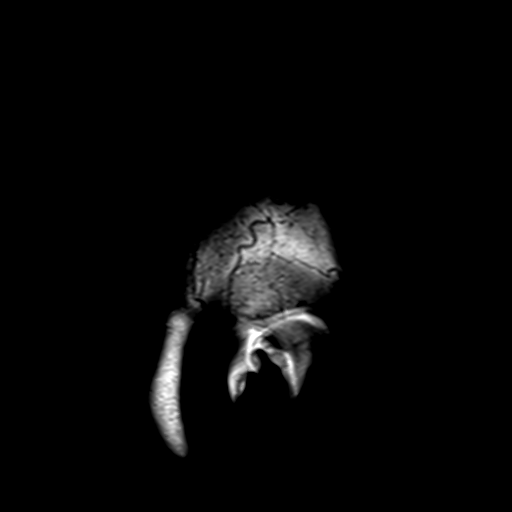

[Series 10: T2 · axial · 5.0mm · 0.86mm/px · 1 of 27 slices shown (1 of 2)]
[im 1/27]
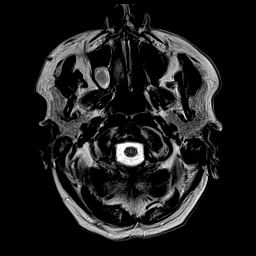

[Series 11: ax swi_mag · axial · 3.0mm · 0.90mm/px · z∈[-75,+88]mm · 2 of 56 slices shown]
[im 1/56]
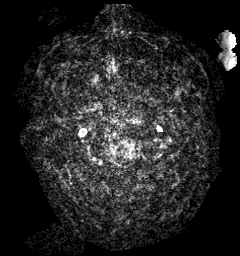
[im 56/56]
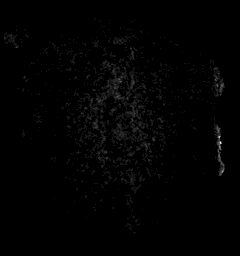

[Series 12: ax swi_pha · axial · 3.0mm · 0.90mm/px · z∈[-75,+88]mm · 2 of 56 slices shown]
[im 1/56]
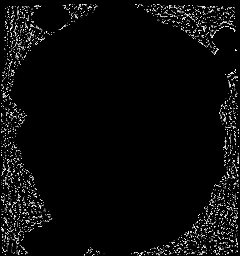
[im 56/56]
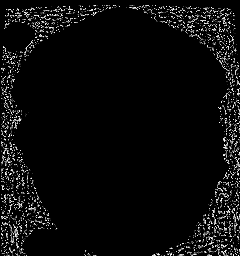

[Series 13: ax swi_swi · axial · 3.0mm · 0.90mm/px · z∈[-75,+88]mm · 2 of 56 slices shown]
[im 1/56]
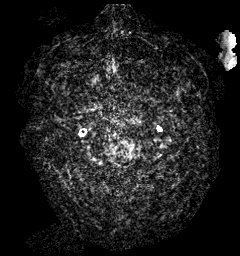
[im 56/56]
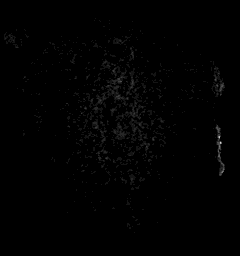

[Series 15: FLAIR · axial · 3.0mm · 0.69mm/px · z∈[-73,+87]mm · 2 of 55 slices shown]
[im 1/55]
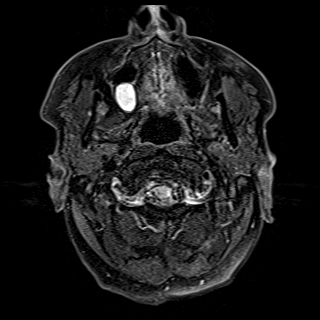
[im 55/55]
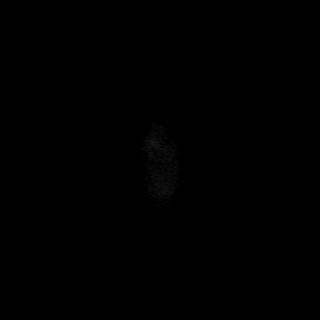

[Series 16: T1 · axial · 1.0mm · 0.98mm/px · z∈[-82,+92]mm · 7 of 175 slices shown (2 of 2)]
[im 1/175]
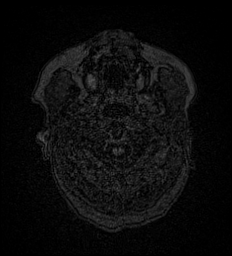
[im 30/175]
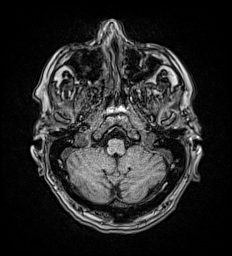
[im 59/175]
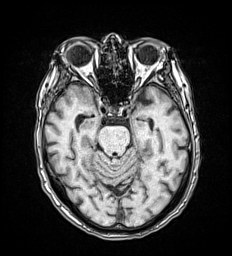
[im 88/175]
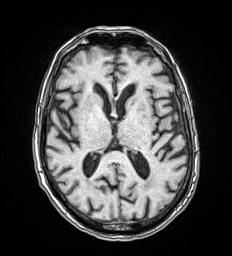
[im 117/175]
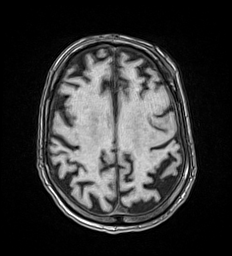
[im 146/175]
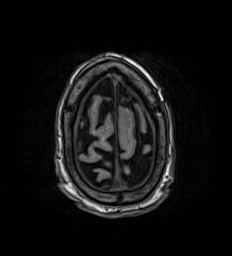
[im 175/175]
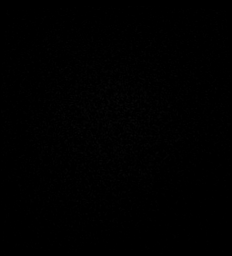

[Series 17: T2 · coronal · 5.0mm · 0.86mm/px · 1 of 32 slices shown (2 of 2)]
[im 1/32]
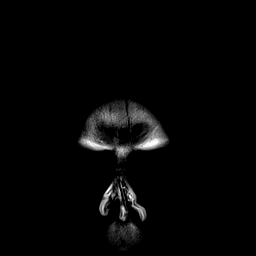

[Series 27: TOF post-contrast · axial · non-contrast · 0.8mm · 0.52mm/px · z∈[-277,-12]mm · 13 of 333 slices shown]
[im 1/333]
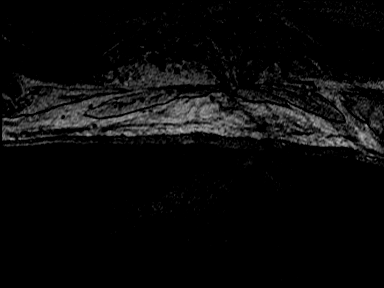
[im 28/333]
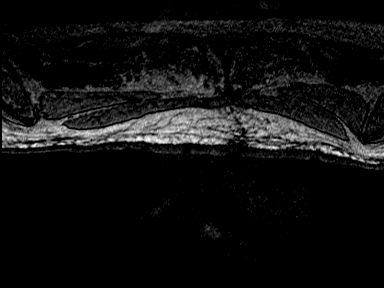
[im 56/333]
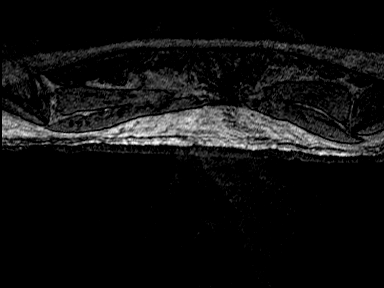
[im 84/333]
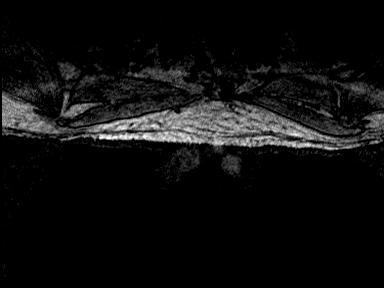
[im 111/333]
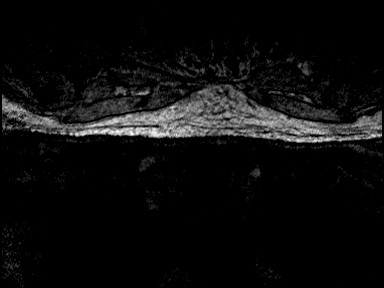
[im 139/333]
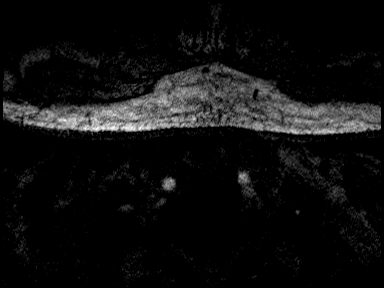
[im 167/333]
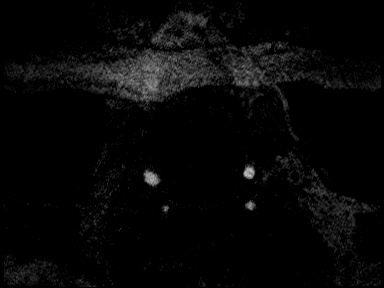
[im 194/333]
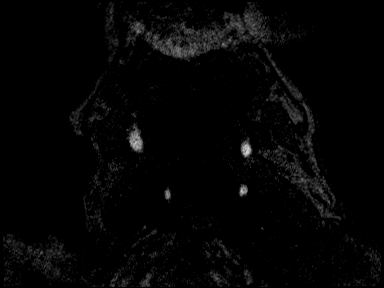
[im 222/333]
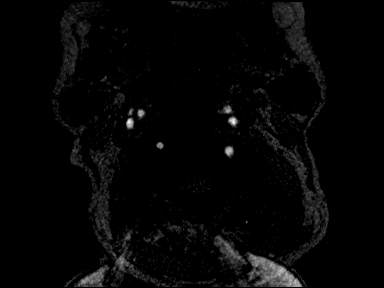
[im 250/333]
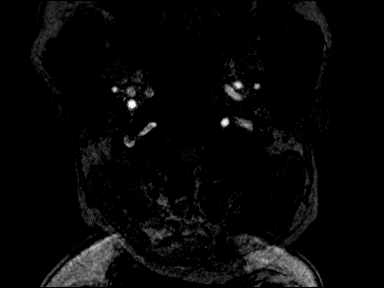
[im 277/333]
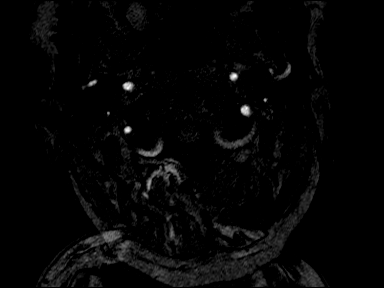
[im 305/333]
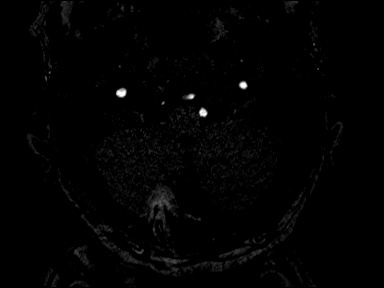
[im 333/333]
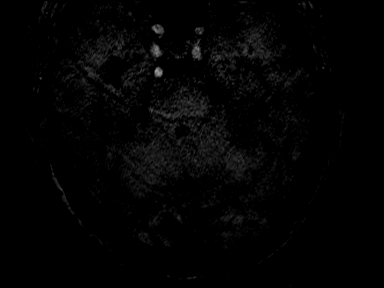

[Series 1069: carotid rotate · sagittal · 364.3mm · 0.47mm/px · 1 of 3 slices shown]
[im 1/3]
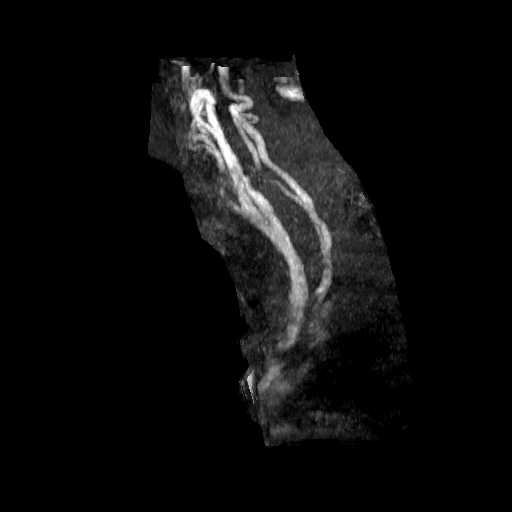

[Series 1081: rcca · sagittal · 364.3mm · 0.43mm/px · 1 of 9 slices shown]
[im 1/9]
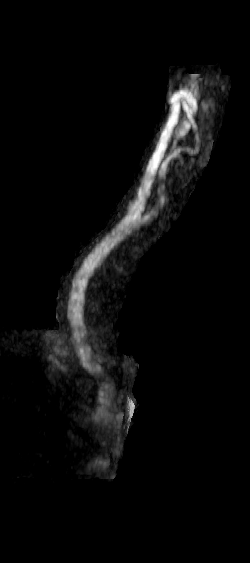

[Series 1085: lcca · sagittal · 364.3mm · 0.47mm/px · 1 of 14 slices shown]
[im 1/14]
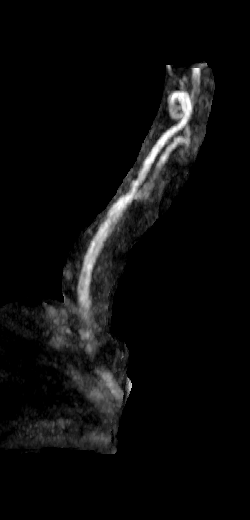

[40 of 48 positions shown; findings below may reference images not displayed]

FINDINGS: MRI HEAD FINDINGS

Brain: Diffusion imaging shows a punctate acute infarction at the
cortical surface of the right parietooccipital junction. There is a
1 cm acute infarction within the left caudate/anterior limb internal
capsule. No large vessel territory infarction. Chronic small-vessel
ischemic changes affect the cerebral hemispheric white matter,
moderate in degree. No focal abnormality affects the brainstem or
cerebellum. No hydrocephalus, hemorrhage or extra-axial collection.

Vascular: Major vessels at the base of the brain show flow.

Skull and upper cervical spine: Negative

Sinuses/Orbits: Clear/normal

Other: None

MRA HEAD FINDINGS

Both internal carotid arteries are patent through the skull base and
siphon regions. The anterior and middle cerebral vessels are patent.
Diminutive left A1 segment, probably congenital. No sign of large
vessel infarction. No aneurysm or vascular malformation.

Both vertebral arteries are widely patent to the basilar. No basilar
stenosis. Posterior circulation branch vessels show flow.

MRA NECK FINDINGS

Both common carotid arteries are patent to the bifurcation level. No
flow limiting stenosis suspected at either carotid bifurcation.
Cervical internal carotid arteries show flow. Antegrade flow is
present within both vertebral arteries, the left being dominant.
IMPRESSION: Punctate acute cortical infarction at the right parietooccipital
junction.

1 cm acute infarction affecting the left caudate/anterior limb
internal capsule. No evidence of hemorrhage in either location.

Moderate chronic small-vessel ischemic changes elsewhere within the
cerebral hemispheric white matter.

MR angiography done without contrast of the neck and head does not
show any stenosis at either carotid bifurcation or any intracranial
large vessel occlusion or correctable proximal stenosis. Diminutive
left A1 segment, favored to be congenital.

## 2021-07-12 MED ORDER — LECITHIN 1200 MG PO CAPS: 1.0000 | ORAL_CAPSULE | Freq: Every day | ORAL | Status: DC

## 2021-07-12 MED ORDER — HYDRALAZINE HCL 20 MG/ML IJ SOLN: 5.0000 mg | INTRAMUSCULAR | Status: DC | PRN

## 2021-07-12 MED ORDER — VITAMIN E 180 MG (400 UNIT) PO CAPS
1.0000 | ORAL_CAPSULE | Freq: Every day | ORAL | Status: DC
  Filled 2021-07-12 (×2): qty 1

## 2021-07-12 MED ORDER — VITAMIN D3 25 MCG (1000 UNIT) PO TABS
2000.0000 [IU] | ORAL_TABLET | Freq: Every day | ORAL | Status: DC
  Administered 2021-07-13: 2000 [IU] via ORAL
  Filled 2021-07-12 (×2): qty 2

## 2021-07-12 MED ORDER — OMEGA-3-ACID ETHYL ESTERS 1 G PO CAPS
1.0000 | ORAL_CAPSULE | Freq: Every day | ORAL | Status: DC
  Administered 2021-07-13: 1 g via ORAL
  Filled 2021-07-12: qty 1

## 2021-07-12 MED ORDER — STROKE: EARLY STAGES OF RECOVERY BOOK: Freq: Once | Status: AC

## 2021-07-12 MED ORDER — PANTOPRAZOLE SODIUM 40 MG PO TBEC
40.0000 mg | DELAYED_RELEASE_TABLET | Freq: Two times a day (BID) | ORAL | Status: DC
  Administered 2021-07-12 – 2021-07-13 (×2): 40 mg via ORAL
  Filled 2021-07-12 (×2): qty 1

## 2021-07-12 MED ORDER — ZINC SULFATE 220 (50 ZN) MG PO CAPS
220.0000 mg | ORAL_CAPSULE | Freq: Every day | ORAL | Status: DC
  Administered 2021-07-13: 220 mg via ORAL
  Filled 2021-07-12: qty 1

## 2021-07-12 MED ORDER — INSULIN GLARGINE-YFGN 100 UNIT/ML ~~LOC~~ SOLN
3.0000 [IU] | Freq: Every day | SUBCUTANEOUS | Status: DC
Start: 2021-07-12 — End: 2021-07-13
  Administered 2021-07-12: 3 [IU] via SUBCUTANEOUS
  Filled 2021-07-12 (×2): qty 0.03

## 2021-07-12 MED ORDER — INSULIN ASPART 100 UNIT/ML IJ SOLN
0.0000 [IU] | Freq: Three times a day (TID) | INTRAMUSCULAR | Status: DC
  Administered 2021-07-13: 2 [IU] via SUBCUTANEOUS
  Filled 2021-07-12: qty 1

## 2021-07-12 MED ORDER — SODIUM CHLORIDE 0.9 % IV SOLN: INTRAVENOUS | Status: DC

## 2021-07-12 MED ORDER — SODIUM CHLORIDE 0.9 % IV BOLUS
1000.0000 mL | Freq: Once | INTRAVENOUS | Status: AC
  Administered 2021-07-12: 1000 mL via INTRAVENOUS

## 2021-07-12 MED ORDER — DM-GUAIFENESIN ER 30-600 MG PO TB12
1.0000 | ORAL_TABLET | Freq: Two times a day (BID) | ORAL | Status: DC | PRN
Start: 2021-07-12 — End: 2021-07-13

## 2021-07-12 MED ORDER — ASPIRIN EC 81 MG PO TBEC
81.0000 mg | DELAYED_RELEASE_TABLET | Freq: Every day | ORAL | Status: DC
Start: 2021-07-12 — End: 2021-07-13
  Administered 2021-07-12 – 2021-07-13 (×2): 81 mg via ORAL
  Filled 2021-07-12 (×2): qty 1

## 2021-07-12 MED ORDER — ACETAMINOPHEN 325 MG PO TABS: 650.0000 mg | ORAL_TABLET | ORAL | Status: DC | PRN

## 2021-07-12 MED ORDER — SENNOSIDES-DOCUSATE SODIUM 8.6-50 MG PO TABS: 1.0000 | ORAL_TABLET | Freq: Every evening | ORAL | Status: DC | PRN

## 2021-07-12 MED ORDER — ADULT MULTIVITAMIN W/MINERALS CH
1.0000 | ORAL_TABLET | Freq: Every day | ORAL | Status: DC
  Administered 2021-07-13: 1 via ORAL
  Filled 2021-07-12: qty 1

## 2021-07-12 MED ORDER — ALBUTEROL SULFATE (2.5 MG/3ML) 0.083% IN NEBU: 2.5000 mg | INHALATION_SOLUTION | RESPIRATORY_TRACT | Status: DC | PRN

## 2021-07-12 MED ORDER — INSULIN ASPART 100 UNIT/ML IJ SOLN: 0.0000 [IU] | Freq: Every day | INTRAMUSCULAR | Status: DC

## 2021-07-12 MED ORDER — ALBUTEROL SULFATE HFA 108 (90 BASE) MCG/ACT IN AERS
2.0000 | INHALATION_SPRAY | RESPIRATORY_TRACT | Status: DC | PRN
Start: 2021-07-12 — End: 2021-07-12

## 2021-07-12 MED ORDER — ACETAMINOPHEN 160 MG/5ML PO SOLN
650.0000 mg | ORAL | Status: DC | PRN
  Filled 2021-07-12: qty 20.3

## 2021-07-12 MED ORDER — ONDANSETRON HCL 4 MG/2ML IJ SOLN
4.0000 mg | Freq: Three times a day (TID) | INTRAMUSCULAR | Status: DC | PRN
Start: 2021-07-12 — End: 2021-07-13

## 2021-07-12 MED ORDER — LEVOTHYROXINE SODIUM 50 MCG PO TABS
175.0000 ug | ORAL_TABLET | Freq: Every day | ORAL | Status: DC
  Administered 2021-07-13: 175 ug via ORAL
  Filled 2021-07-12: qty 1

## 2021-07-12 MED ORDER — ATORVASTATIN CALCIUM 20 MG PO TABS
40.0000 mg | ORAL_TABLET | Freq: Every day | ORAL | Status: DC
  Administered 2021-07-13: 40 mg via ORAL
  Filled 2021-07-12: qty 2

## 2021-07-12 MED ORDER — ACETAMINOPHEN 650 MG RE SUPP: 650.0000 mg | RECTAL | Status: DC | PRN

## 2021-07-12 MED ORDER — ASPIRIN 325 MG PO TABS
325.0000 mg | ORAL_TABLET | Freq: Every day | ORAL | Status: DC
  Filled 2021-07-12: qty 1

## 2021-07-12 MED ORDER — ASCORBIC ACID 500 MG PO TABS
1000.0000 mg | ORAL_TABLET | Freq: Every day | ORAL | Status: DC
  Administered 2021-07-13: 1000 mg via ORAL
  Filled 2021-07-12: qty 2

## 2021-07-12 NOTE — ED Triage Notes (Signed)
Pt is here with home care provider, states the pt slip out of bed yesterday and again this morning , pt has a hematoma to the left side of head, concerns for pt having some confusion, was questionable about the date. Pt denies any pain, pt is alert on arrival ?

## 2021-07-12 NOTE — ED Provider Notes (Signed)
? ?[facility] ?Provider Note ? ? ? Event Date/Time  ? First MD Initiated Contact with Patient 07/12/21 1023   ?  (approximate) ? ?History  ? ?Chief Complaint: Fall ? ?HPI ? ?Charles Wilkinson is a 76 y.o. male with a past medical history of diabetes, hypertension, hyperlipidemia, gastric reflux, presents to the emergency department for a fall.  According to the patient he states yesterday he fell getting out of bed, states he just slipped out of bed onto his buttocks.  This morning the home health provider came to assist the patient noticed that he had an abrasion to his left forehead but the patient did not recall when that happened, they are not sure if the patient had another fall or if he possibly hit his head on something.  Care provider said this morning the patient seems somewhat confused but currently he seems back to his baseline per care provider.  Patient denies any pain anywhere.  Denies any fever cough congestion vomiting diarrhea or any other illnesses recently.  States he feels well.  Father states the patient PCP has been adding back medications since the patient was discharged from the hospital in December after admission for COVID.  Care provider thinks this could possibly be medication induced. ? ?Physical Exam  ? ?Triage Vital Signs: ?ED Triage Vitals  ?Enc Vitals Group  ?   BP 07/12/21 1016 (!) 142/73  ?   Pulse Rate 07/12/21 1016 67  ?   Resp 07/12/21 1016 14  ?   Temp 07/12/21 1016 98.6 ?F (37 ?C)  ?   Temp Source 07/12/21 1016 Oral  ?   SpO2 07/12/21 1016 97 %  ?   Weight 07/12/21 1009 180 lb (81.6 kg)  ?   Height 07/12/21 1009 5\' 10"  (1.778 m)  ?   Head Circumference --   ?   Peak Flow --   ?   Pain Score 07/12/21 1009 0  ?   Pain Loc --   ?   Pain Edu? --   ?   Excl. in GC? --   ? ? ?Most recent vital signs: ?Vitals:  ? 07/12/21 1016  ?BP: (!) 142/73  ?Pulse: 67  ?Resp: 14  ?Temp: 98.6 ?F (37 ?C)  ?SpO2: 97%  ? ? ?General: Awake, no distress.  ?CV:  Good peripheral  perfusion.  Regular rate and rhythm  ?Resp:  Normal effort.  Equal breath sounds bilaterally.  ?Abd:  No distention.  Soft, nontender.  No rebound or guarding. ?Other:  Small abrasion to left forehead.  Hemostatic ? ? ?ED Results / Procedures / Treatments  ? ?EKG ? ?EKG viewed and interpreted by myself shows a normal sinus rhythm at 68 bpm with a narrow QRS, normal axis, normal intervals, no concerning ST changes. ? ?RADIOLOGY ? ?I personally reviewed the CT images the head, no acute abnormality seen on my evaluation. ?Radiology is read the CT as possible subacute infarct new compared to CT of 04/01/2021. ? ? ?MEDICATIONS ORDERED IN ED: ?Medications  ?sodium chloride 0.9 % bolus 1,000 mL (has no administration in time range)  ? ? ? ?IMPRESSION / MDM / ASSESSMENT AND PLAN / ED COURSE  ?I reviewed the triage vital signs and the nursing notes. ? ?Patient presents to the emergency department for a fall yesterday and possibly 1 again today.  Somewhat confused this morning per care provider but back to normal now.  Overall the patient appears well answering questions appropriately.  We will  check labs, urinalysis and CT scan of the head and a COVID swab.  We will IV hydrate while awaiting results.  Patient agreeable to plan of care. ? ?Patient's work-up is shown a normal urinalysis.  Negative COVID/flu.  CBC is unchanged from prior levels.  Chemistry shows mild renal insufficiency compared to baseline otherwise largely unchanged from baseline.  Patient receiving IV fluids.  Troponin reassuringly negative.  Patient CT scan however showed a subacute infarct.  Given 2 falls over the past 24 hours with no history of falls previously we will proceed with MRI of the brain to rule out an acute infarct as this may very well change the patient's disposition.  I spoke to the patient and caregiver regarding this they are agreeable. ? ?MRI of the brain shows what appears to be 2 acute infarcts.  No hemorrhage.  Given the 2 acute  strokes will admit to the hospitalist service for concern over possible embolic showering/CVA.  Discussed this plan of care with the patient and caregiver they are agreeable. ? ?FINAL CLINICAL IMPRESSION(S) / ED DIAGNOSES  ? ?Fall ?Weakness ?CVA x2 ? ?Note:  This document was prepared using Dragon voice recognition software and may include unintentional dictation errors. ?  ?Minna Antis, MD ?07/12/21 1517 ? ?

## 2021-07-12 NOTE — Consult Note (Signed)
NEUROLOGY CONSULTATION NOTE  ? ?Date of service: July 12, 2021 ?Patient Name: Charles Wilkinson ?MRN:  [id number] ?DOB:  May 15, 1945 ?Reason for consult: "stroke on MRI" ?Requesting Provider: Ivor Costa, MD ?_ _ _   _ __   _ __ _ _  __ __   _ __   __ _ ? ?History of Present Illness  ?Juwann Swansen is a 76 y.o. male with PMH significant for DM2, HTN, HLD, NASH, hypothyroidism, GERD who presented to the ED with fall and confusion. ? ?patient reports a couple days ago, he slipped out of his bed and his head was wedged between the bed and the night stand. Today, he slipped out of bed again and was noted to have a bruise on his left forehead by his care giver. Care giver noticed that patient appeared more confused and patient does not recall what happened. 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 MRI brain with punctate acute cortical infarction at the right parietooccipital junction. 1 cm acute infarction affecting the left caudate/anterior limb internal capsule. No evidence of hemorrhage in either location. ? ?No prior personal hx of stroke. Feels his diabetes is well controlled and so is his BP, does not smoke, no recreational substances. ? ?Reports he fell back in December when he was discharged home after hospitalization for COVID. He has had over 4 falls in the last 6 months. ? ?He feels weak all over, no focal deficits. ?  ?ROS  ? ?Constitutional Denies weight loss, fever and chills.   ?HEENT Denies changes in vision and hearing.   ?Respiratory Denies SOB and cough.   ?CV Denies palpitations and CP   ?GI Denies abdominal pain, nausea, vomiting and diarrhea.   ?GU Denies dysuria and urinary frequency.   ?MSK Denies myalgia and joint pain.   ?Skin Denies rash and pruritus.   ?Neurological Denies headache and syncope.   ?Psychiatric Denies recent changes in mood. Denies anxiety and depression.    ? ?Past History  ? ?Past Medical History:  ?Diagnosis Date  ? Diabetes mellitus without complication ([place])   ? GERD (gastroesophageal reflux disease)   ? Hyperlipidemia   ? Hypertension   ? Hypothyroidism   ? NASH (nonalcoholic steatohepatitis)   ? ?Past Surgical History:  ?Procedure Laterality Date  ? APPENDECTOMY    ? HERNIA REPAIR    ? ?Family History  ?Problem Relation Age of Onset  ? CAD Mother   ? Diabetes Mother   ? ?Social History  ? ?Socioeconomic History  ? Marital status: Widowed  ?  Spouse name: Not on file  ? Number of children: Not on file  ? Years of education: Not on file  ? Highest education level: Not on file  ?Occupational History  ? Not on file  ?Tobacco Use  ? Smoking status: Former  ?  Types: Cigarettes  ? Smokeless tobacco: Never  ?Vaping Use  ? Vaping Use: Never used  ?Substance and Sexual Activity  ? Alcohol use: Not Currently  ? Drug use: Never  ? Sexual activity: Not on file  ?Other Topics Concern  ? Not on file  ?Social History Narrative  ? Not on file  ? ?Social Determinants of Health  ? ?Financial Resource Strain: Not on file  ?Food Insecurity: Not on file  ?Transportation Needs: Not on file  ?Physical Activity: Not on file  ?Stress:  Not on file  ?Social Connections: Not on file  ? ?No Known Allergies ? ?Medications  ?(Not in a hospital admission) ?  ? ?Vitals  ? ?Vitals:  ? 07/12/21 1230 07/12/21 1300 07/12/21 1515 07/12/21 1530  ?BP: (!) 157/76 (!) 162/80 139/67 126/66  ?Pulse: 64 64 68 67  ?Resp: 17 16 14 14   ?Temp:    98.6 ?F (37 ?C)  ?TempSrc:    Oral  ?SpO2: 100% 100% 99% 98%  ?Weight:      ?Height:      ?  ? ?Body mass index is 25.83 kg/m?. ? ?Physical Exam  ? ?General: Laying comfortably in bed; in no acute distress.  ?HENT: Normal oropharynx and mucosa. Normal external appearance of ears and nose.  ?Neck: Supple, no pain or tenderness  ?CV: No JVD. No peripheral edema.  ?Pulmonary: Symmetric Chest rise. Normal respiratory effort.  ?Abdomen: Soft to touch, non-tender.  ?Ext: No  cyanosis, edema, or deformity  ?Skin: No rash. Normal palpation of skin.   ?Musculoskeletal: Normal digits and nails by inspection. No clubbing.  ? ?Neurologic Examination  ?Mental status/Cognition: Alert, oriented to self, place, month and year, good attention.  ?Speech/language: Fluent, comprehension intact, object naming intact, repetition intact.  ?Cranial nerves:  ? CN II Pupils equal and reactive to light, no VF deficits   ? CN III,IV,VI EOM intact, no gaze preference or deviation, no nystagmus   ? CN V normal sensation in V1, V2, and V3 segments bilaterally   ? CN VII no asymmetry, no nasolabial fold flattening   ? CN VIII normal hearing to speech   ? CN IX & X normal palatal elevation, no uvular deviation   ? CN XI 5/5 head turn and 5/5 shoulder shrug bilaterally   ? CN XII midline tongue protrusion   ? ?Motor:  ?Muscle bulk: normal, tone cogwheeling rigidity, pronator drift none tremor yes mild parkinsonian tremor. ?Mvmt Root Nerve  Muscle Right Left Comments  ?SA C5/6 Ax Deltoid 5 5   ?EF C5/6 Mc Biceps 5 5   ?EE C6/7/8 Rad Triceps 5 5   ?WF C6/7 Med FCR     ?WE C7/8 PIN ECU     ?F Ab C8/T1 U ADM/FDI 5 5   ?HF L1/2/3 Fem Illopsoas 4+ 4+   ?KE L2/3/4 Fem Quad 5 5   ?DF L4/5 D Peron Tib Ant 5 5   ?PF S1/2 Tibial Grc/Sol 5 5   ? ?Reflexes: ? Right Left Comments  ?Pectoralis     ? Biceps (C5/6) 2 2   ?Brachioradialis (C5/6) 2 2   ? Triceps (C6/7) 2 2   ? Patellar (L3/4) 2 2   ? Achilles (S1) 2 2   ? Hoffman     ? Plantar     ?Jaw jerk   ? ?Sensation: ? Light touch Intact throughout  ? Pin prick   ? Temperature   ? Vibration   ?Proprioception   ? ?Coordination/Complex Motor:  ?- Finger to Nose intact BL ?- Heel to shin intact BL ?- Rapid alternating movement are slowed ?- Gait: Deferred. ? ?Labs  ? ?CBC:  ?Recent Labs  ?Lab 07/12/21 ?1056  ?WBC 3.8*  ?HGB 9.5*  ?HCT 30.1*  ?MCV 86.7  ?PLT 96*  ? ? ?Basic Metabolic Panel:  ?Lab Results  ?Component Value Date  ? NA 135 07/12/2021  ? K 4.4 07/12/2021  ? CO2 21 (L)  07/12/2021  ? GLUCOSE 233 (H) 07/12/2021  ? BUN 36 (H) 07/12/2021  ?  CREATININE 1.60 (H) 07/12/2021  ? CALCIUM 8.7 (L) 07/12/2021  ? GFRNONAA 44 (L) 07/12/2021  ? ?Lipid Panel: No results found for: [place] ?HgbA1c:  ?Lab Results  ?Component Value Date  ? HGBA1C 6.4 (H) 04/02/2021  ? ?Urine Drug Screen: No results found for: LABOPIA, COCAINSCRNUR, [place], [place], THCU, LABBARB  ?Alcohol Level  ?   ?Component Value Date/Time  ? ETH <10 04/01/2021 1453  ? ?MR Angio head without contrast and MR angio neck(Personally reviewed): ?No LVO ? ?MRI Brain(Personally reviewed): ?Punctate acute cortical infarction at the right parietooccipital ?junction. ?  ?1 cm acute infarction affecting the left caudate/anterior limb ?internal capsule. No evidence of hemorrhage in either location. ? ? ?Impression  ? ?Zackarie Massari is a 76 y.o. male with PMH significant for DM2, HTN, HLD, NASH, hypothyroidism, GERD who presented to the ED with fall and confusion. Found to have a 1cm left caudate/IC stroke along with an additional punctate stroke that appears to be cortical. His neurologic examination is notable for no focal deficit. 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 I suspect that underlying parkinsonism could be contributing to his falls but it appears very mild and I would recommend repeat evaluation and treatment for this outpatient. ? ?As for the stroke, the left caudate/IC stroke appears to be a small vessel stroke and he does have risk factors including DM2, HTN, HLD. The noted punctate cortical infarct in the R pariet-ocipital junction appears to be more consistent with an embolic infarct, ?watershed cardioembolic stroke. ? ?Recommendations  ?Plan: ? ?- Frequent Neuro checks per stroke unit protocol ?- Recommend obtaining TTE ?- Recommend obtaining Lipid panel with LDL ?- Please start statin if LDL > 70 ?- Recommend HbA1c ?- Antithrombotic - Aspirin  81mg  daily, hold off on dual antiplatelet given falls recently. ?- Recommend DVT ppx ?- SBP goal - permissive hypertension first 24 h < 220/110. Held home meds.  ?- Recommend Telemetry monitoring for arr

## 2021-07-12 NOTE — Progress Notes (Addendum)
SLP Cancellation Note ? ?Patient Details ?Name: Charles Wilkinson ?MRN: [id number] ?DOB: 1946/01/04 ? ? ?Cancelled treatment:       Reason Eval/Treat Not Completed: SLP screened, no needs identified, will sign off (chart reviewed; consulted w/ pt and [name] in room.) ?Per Neurologist's note and MRI today, pt has had a 1cm left caudate/IC stroke along with an additional punctate stroke that appears to be cortical. Per Neurology, his neurologic examination was notable for No focal deficits, however, exam was concerning for mild underlying Parkinsonism with mask-like face, bradykinesia, and cogwheel rigidity. The Parkinsonism could be contributing to his Falls at home per MD. Outpatient f/u for further Neuro assessment has been recommended by Neurologist. ?Pt and Caregiver denied any difficulty swallowing; pt passed the [facility] swallow screen in the ED w/ NSG. Pt is now on a regular diet per MD order; he tolerated swallowing pills w/ water. Pt responded in short phrases during screening w/out overt expressive/receptive deficits noted; pt and Caregiver denied any current speech-language deficits. Speech clear. Pt seemed to need clear instructions -- unsure of pt's Baseline Cognitive status(MRI: "Chronic small-vessel ischemic changes affect the cerebral hemispheric white matter, Moderate in degree.".  ? ?No further skilled ST services indicated w/ current presentation and in light of recommended f/u w/ Neurology Outpatient to assess Parkinsonism questions. If any further ST services needed for cognitive-communication therapy after Outpatient Neurology assessment, then pt can be referred to Outpatient ST services. Pt and Caregiver agreed. NSG to reconsult if any new needs while admitted.   ? ? ? ? ?Jerilynn Som, MS, CCC-SLP ?Speech Language Pathologist ?Rehab Services; [facility] [place] ?[phone] (ascom) ?Dotti Busey ?07/12/2021, 5:02 PM ?

## 2021-07-12 NOTE — H&P (Signed)
?History and Physical  ? ? ?[place] [id number] DOB: 09/28/1945 DOA: 07/12/2021 ? ?Referring MD/NP/PA:  ? ?PCP: Mick SellFitzgerald, David P, MD  ? ?Patient coming from:  The patient is coming from home.  At baseline, pt is independent for most of ADL.       ? ?Chief Complaint: fall ? ?HPI: Charles Wilkinson is a 76 y.o. male with medical history significant of hypertension, hyperlipidemia, diabetes mellitus, GERD, hypothyroidism, NASH, pancytopenia, CKD-3A, who presents with fall. ? ?Pt states that he fell twice at home. First fall happened last night when he was getting out of bed, and slipped out of bed onto his buttocks. He states that he fell again this AM. He has a hematoma and abrasion to his left forehead. Patient denies unilateral numbness or tinglings in extremities. No facial droop or slurred speech. Per EDP, caregiver reported that the patient had some confusion this morning, which seem to have resolved.  When I saw patient in the ED, patient is alert, oriented x3.  Patient answered all questions appropriately. He denies chest pain, shortness breath, fever or chills.  Patient has some mild cough.  No nausea, vomiting, diarrhea or abdominal pain.  No symptoms of UTI. ? ?Data Reviewed and ED Course: pt was found to have pancytopenia with hemoglobin 9.5, WBC 3.8, platelet 96 (patient had hemoglobin 8.4, WBC 3.4, platelet 112 on 04/10/2021), negative urinalysis, worsening renal function with creatinine 1.60, BUN 36, GFR 44 (baseline creatinine 1.1-1.2), temperature normal, blood pressure 162/80, heart rate 67,  RR17, oxygen saturation 97% on room air. 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 Patient is placed on MedSurg bed for patient, Dr. Derry LoryKhaliqdina of of neurology is consulted. ? 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 ?  ?1 cm acute infarction  affecting the left caudate/anterior limb ?internal capsule. No evidence of hemorrhage in either location. ?  ?Moderate chronic small-vessel ischemic changes elsewhere within the ?cerebral hemispheric white matter. ?  ?MR angiography done without contrast of the neck and head does not ?show any stenosis at either carotid bifurcation or any intracranial ?large vessel occlusion or correctable proximal stenosis. 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 ? ? ?EKG: I have personally reviewed.  Sinus rhythm, QTc 448, borderline left axis deviation, nonspecific T wave change ? ? ?Review of Systems:  ? ?General: no fevers, chills, no body weight gain, has fatigue ?HEENT: no blurry vision, hearing changes or sore throat ?Respiratory: no dyspnea, coughing, wheezing ?CV: no chest pain, no palpitations ?GI: no nausea, vomiting, abdominal pain, diarrhea, constipation ?GU: no dysuria, burning on urination, increased urinary frequency, hematuria  ?Ext: no leg edema ?Neuro: no unilateral weakness, numbness, or tingling, no vision change or hearing loss. Has fall ?Skin: no rash. has abrasion and hematoma to left forehead ?MSK: No muscle spasm, no deformity, no limitation of range of movement in spin ?Heme: No easy bruising.  ?Travel history: No recent long distant travel. ? ? ?Allergy: No Known Allergies ? ?Past Medical History:  ?Diagnosis Date  ? Diabetes mellitus without complication (H[facility])   ? GERD (gastroesophageal reflux disease)   ? Hyperlipidemia   ? Hypertension   ? Hypothyroidism   ? NASH (nonalcoholic steatohepatitis)   ? ? ?Past Surgical History:  ?Procedure Laterality Date  ? APPENDECTOMY    ? HERNIA REPAIR    ? ? ?Social History:  reports that he has quit smoking. His smoking use included cigarettes. He has never used smokeless tobacco. He reports that he does not currently use alcohol. He reports that he does not use drugs. ? ?Family History:  ?Family History  ?Problem Relation Age of Onset  ? CAD Mother    ? Diabetes Mother   ?  ? ?Prior to Admission medications   ?Medication Sig Start Date End Date Taking? Authorizing Provider  ?acetaminophen (TYLENOL) 325 MG tablet Take 2 tablets (650 mg total) by mouth every 6 (six) hours as needed for mild pain or headache (fever >/= 101). 04/10/21   Alford Highland, MD  ?albuterol (VENTOLIN HFA) 108 (90 Base) MCG/ACT inhaler Inhale 2 puffs into the lungs every 6 (six) hours as needed for wheezing or shortness of breath. 04/10/21   Alford Highland, MD  ?ascorbic acid (VITAMIN C) 1000 MG tablet Take 1 tablet by mouth daily.    [provider]  ?Cholecalciferol 50 MCG (2000 UT) CAPS Take 2,000 Units by mouth daily.    [provider]  ?insulin aspart (NOVOLOG) 100 UNIT/ML FlexPen Inject 2 Units into the skin in the morning, at noon, and at bedtime. 02/01/21 02/01/22  [provider]  ?insulin glargine (LANTUS) 100 UNIT/ML injection Inject 5 Units into the skin at bedtime. 02/01/21 02/01/22  [provider]  ?Lecithin 1200 MG CAPS Take 1 capsule by mouth daily.    [provider]  ?levothyroxine (SYNTHROID) 175 MCG tablet Take 175 mcg by mouth daily. 02/01/21   [provider]  ?metFORMIN (GLUCOPHAGE) 500 MG tablet Take 500 mg by mouth 2 (two) times daily. 03/18/21   [provider]  ?Multiple Vitamin (MULTI-VITAMIN) tablet Take 1 tablet by mouth daily.    [provider]  ?Omega-3 Fatty Acids (OMEGA-3 FISH OIL) 1200 MG CAPS Take 1 capsule by mouth daily.    [provider]  ?pantoprazole (PROTONIX) 40 MG tablet Take 40 mg by mouth 2 (two) times daily. 01/02/21   [provider]  ?Vitamin E 268 MG (400 UNIT) CAPS Take 1 tablet by mouth daily.    [provider]  ?zinc sulfate 220 (50 Zn) MG capsule Take 1 capsule (220 mg total) by mouth daily. 04/11/21   Alford Highland, MD  ? ? ?Physical Exam: ?Vitals:  ? 07/12/21 1300 07/12/21 1515 07/12/21 1530 07/12/21 1600  ?BP: (!) 162/80 139/67  126/66 137/73  ?Pulse: 64 68 67 66  ?Resp: 16 14 14 13   ?Temp:   98.6 ?F (37 ?C)   ?TempSrc:   Oral   ?SpO2: 100% 99% 98% 100%  ?Weight:      ?Height:      ? ?General: Not in acute distress ?HEENT: ?      Eyes: PERRL, EOMI, no scleral icterus. ?      ENT: No discharge from the ears and nose, no pharynx injection, no tonsillar enlargement.  ?      Neck: No JVD, no bruit, no mass felt. ?Heme: No neck lymph node enlargement. ?Cardiac: S1/S2, RRR, No murmurs, No gallops or rubs. ?Respiratory: No rales, wheezing, rhonchi or rubs. ?GI: Soft, nondistended, nontender, no rebound pain, no organomegaly, BS present. ?GU: No hematuria ?Ext: No pitting leg edema bilaterally. 1+DP/PT pulse bilaterally. ?Musculoskeletal: No joint deformities, No joint redness or warmth, no limitation of ROM in spin. ?Skin: No rashes. has abrasion and a small hematoma to left forehead ?Neuro: Alert, oriented X3, cranial nerves II-XII grossly intact, moves all extremities normally. Muscle strength  5/5 in all extremities, sensation to light touch intact.  ?Psych: Patient is not psychotic, no suicidal or hemocidal ideation. ? ?Labs on Admission: I have personally reviewed following labs and imaging studies ? ?CBC: ?Recent Labs  ?Lab 07/12/21 ?1056  ?WBC 3.8*  ?HGB 9.5*  ?HCT 30.1*  ?MCV 86.7  ?PLT 96*  ? ?Basic Metabolic Panel: ?Recent Labs  ?Lab 07/12/21 ?1056  ?NA 135  ?K 4.4  ?CL 104  ?CO2 21*  ?GLUCOSE 233*  ?BUN 36*  ?CREATININE 1.60*  ?CALCIUM 8.7*  ? ?GFR: ?Estimated Creatinine Clearance: 40.6 mL/min (A) (by C-G formula based on SCr of 1.6 mg/dL (H)). ?Liver Function Tests: ?Recent Labs  ?Lab 07/12/21 ?1056  ?AST 22  ?ALT 16  ?ALKPHOS 82  ?BILITOT 1.1  ?PROT 6.5  ?ALBUMIN 3.0*  ? ?No results for input(s): LIPASE, AMYLASE in the last 168 hours. ?No results for input(s): AMMONIA in the last 168 hours. ?Coagulation Profile: ?No results for input(s): INR, PROTIME in the last 168 hours. ?Cardiac Enzymes: ?No results for input(s): CKTOTAL, CKMB,  CKMBINDEX, TROPONINI in the last 168 hours. ?BNP (last 3 results) ?No results for input(s): PROBNP in the last 8760 hours. ?HbA1C: ?No results for input(s): HGBA1C in the last 72 hours. ?CBG: ?No results for i

## 2021-07-12 NOTE — Progress Notes (Signed)
PHARMACIST - PHYSICIAN ORDER COMMUNICATION ? ?CONCERNING: P&T Medication Policy on Herbal Medications ? ?DESCRIPTION:  This patient?s order for:  Lecithin  has been noted. ? ?This product(s) is classified as an ?herbal? or natural product. ?Due to a lack of definitive safety studies or FDA approval, nonstandard manufacturing practices, plus the potential risk of unknown drug-drug interactions while on inpatient medications, the Pharmacy and Therapeutics Committee does not permit the use of ?herbal? or natural products of this type within [facility]. ?  ?ACTION TAKEN: ?The pharmacy department is unable to verify this order at this time and your patient has been informed of this safety policy. ?Please reevaluate patient?s clinical condition at discharge and address if the herbal or natural product(s) should be resumed at that time. ? ? ?Casimiro Lienhard Rodriguez-Guzman PharmD, BCPS ?07/12/2021 5:37 PM ? ?

## 2021-07-13 ENCOUNTER — Observation Stay
Admit: 2021-07-13 | Discharge: 2021-07-13 | Disposition: A | Discharge status: Home / Self Care | Payer: Medicare Other | Attending: Internal Medicine | Admitting: Internal Medicine

## 2021-07-13 DIAGNOSIS — I10 Essential (primary) hypertension: Secondary | ICD-10-CM | POA: Diagnosis not present

## 2021-07-13 DIAGNOSIS — N179 Acute kidney failure, unspecified: Secondary | ICD-10-CM | POA: Diagnosis not present

## 2021-07-13 LAB — BASIC METABOLIC PANEL
Anion gap: 7 (ref 5–15)
BUN: 27 mg/dL — ABNORMAL HIGH (ref 8–23)
CO2: 21 mmol/L — ABNORMAL LOW (ref 22–32)
Calcium: 8.2 mg/dL — ABNORMAL LOW (ref 8.9–10.3)
Chloride: 109 mmol/L (ref 98–111)
Creatinine, Ser: 1.21 mg/dL (ref 0.61–1.24)
GFR, Estimated: >60 mL/min (ref >60)
Glucose, Bld: 105 mg/dL — ABNORMAL HIGH (ref 70–99)
Potassium: 3.9 mmol/L (ref 3.5–5.1)
Sodium: 137 mmol/L (ref 135–145)

## 2021-07-13 LAB — LIPID PANEL
Cholesterol: 148 mg/dL (ref 0–200)
HDL: 24 mg/dL — ABNORMAL LOW (ref >40)
LDL Cholesterol: 95 mg/dL (ref 0–99)
Total CHOL/HDL Ratio: 6.2 RATIO
Triglycerides: 145 mg/dL (ref <150)
VLDL: 29 mg/dL (ref 0–40)

## 2021-07-13 LAB — ECHOCARDIOGRAM COMPLETE
AR max vel: 3.08 cm2
AV Peak grad: 7.2 mmHg
Ao pk vel: 1.34 m/s
Area-P 1/2: 2.84 cm2
Height: 70 in
S' Lateral: 3.3 cm
Weight: 2880 oz

## 2021-07-13 LAB — HEMOGLOBIN A1C
Hgb A1c MFr Bld: 7.1 % — ABNORMAL HIGH (ref 4.8–5.6)
Mean Plasma Glucose: 157.07 mg/dL

## 2021-07-13 LAB — GLUCOSE, CAPILLARY
Glucose-Capillary: 110 mg/dL — ABNORMAL HIGH (ref 70–99)
Glucose-Capillary: 161 mg/dL — ABNORMAL HIGH (ref 70–99)

## 2021-07-13 MED ORDER — ATORVASTATIN CALCIUM 40 MG PO TABS: 40.0000 mg | ORAL_TABLET | Freq: Every day | ORAL | 1 refills | Status: DC

## 2021-07-13 MED ORDER — ASPIRIN 81 MG PO TBEC: 81.0000 mg | DELAYED_RELEASE_TABLET | Freq: Every day | ORAL | 11 refills | Status: AC

## 2021-07-13 NOTE — Progress Notes (Signed)
*  PRELIMINARY RESULTS* ?Echocardiogram ?2D Echocardiogram has been performed. ? ?Lenor Coffin ?07/13/2021, 11:43 AM ?

## 2021-07-13 NOTE — Hospital Course (Addendum)
Taken from H&P. ? ? Charles Wilkinson is a 76 y.o. male with medical history significant of hypertension, hyperlipidemia, diabetes mellitus, GERD, hypothyroidism, NASH, pancytopenia, CKD-3A, who presents with fall. ?  ?Pt states that he fell twice at home. First fall happened last night when he was getting out of bed, and slipped out of bed onto his buttocks. He states that he fell again this AM. He has a hematoma and abrasion to his left forehead. Patient denies unilateral numbness or tinglings in extremities. No facial droop or slurred speech. Per EDP, caregiver reported that the patient had some confusion this morning, which seem to have resolved.  When I saw patient in the ED, patient is alert, oriented x3.  Patient answered all questions appropriately. He denies chest pain, shortness breath, fever or chills.  Patient has some mild cough.  No nausea, vomiting, diarrhea or abdominal pain.  No symptoms of UTI. ? ?Data Reviewed and ED Course: pt was found to have pancytopenia with hemoglobin 9.5, WBC 3.8, platelet 96 (patient had hemoglobin 8.4, WBC 3.4, platelet 112 on 04/10/2021), negative urinalysis, worsening renal function with creatinine 1.60, BUN 36, GFR 44 (baseline creatinine 1.1-1.2), temperature normal, blood pressure 162/80, heart rate 67,  RR17, oxygen saturation 97% on room air. 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 Patient is placed on MedSurg bed for patient, Dr. Derry Lory of of neurology is consulted. ? 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 ? 1 cm acute infarction affecting the left caudate/anterior limb ?internal capsule. No evidence of hemorrhage in either location. ? Moderate chronic small-vessel ischemic changes elsewhere within the ?cerebral hemispheric white matter. ?  ?MR angiography done without contrast of the neck and  head does not ?show any stenosis at either carotid bifurcation or any intracranial ?large vessel occlusion or correctable proximal stenosis. 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 ?  ? EKG: I have personally reviewed.  Sinus rhythm, QTc 448, borderline left axis deviation, nonspecific T wave change. ? ?Patient without any deficit.  Evaluated by PT and OT who recommended home health services.  Echocardiogram without any significant abnormality.  He was started on low-dose aspirin and Lipitor.  Neurology opted to avoid DAPT due to risk of fall. ?Neurologist suggested ZIO monitor.  Unable to obtained due to weekend.  Patient need to go to [facility] cardiology office to get it earlier next week. ? ?Patient will continue current management and follow-up with his providers. ? ?

## 2021-07-13 NOTE — Evaluation (Signed)
Occupational Therapy Evaluation ?Patient Details ?Name: Charles Wilkinson ?MRN: [id number] ?DOB: 1945/10/08 ?Today's Date: 07/13/2021 ? ? ?History of Present Illness 76 year old male brought to the ED after an unwitnessed fall at his facility.  Positive  COVID-19 encephalopathy and acute rehabdomyolosis. Admitted for IV fluid hydration and IV remdesivir. Pt fell in hospital requiring stapltes in his scalp. PMH significant for DM II, HTN, hypothyroidsim, hyperlipedemia, NASH and GERD  ? ?Clinical Impression ?  ?Pt seen for OT evaluation this date.  Pt lives in independent living facility and has a caregiver 4 hours a day.  He ambulates with a rollator in the apartment and has supervision when he leaves the apartment to go to meals.  He required some assistance with bathing, IADLs prior to admission.  He now presents with muscle weakness, decreased mobility, history of falls and would benefit from skilled OT services to maximize safety and independence for necessary daily tasks.  Recommend HHOT upon discharge to work towards return to his baseline level of function.  ?   ? ?Recommendations for follow up therapy are one component of a multi-disciplinary discharge planning process, led by the attending physician.  Recommendations may be updated based on patient status, additional functional criteria and insurance authorization.  ? ?Follow Up Recommendations ? Home health OT  ?  ?Assistance Recommended at Discharge Intermittent Supervision/Assistance  ?Patient can return home with the following A little help with bathing/dressing/bathroom;Assistance with cooking/housework;Assist for transportation ? ?  ?Functional Status Assessment ? Patient has had a recent decline in their functional status and demonstrates the ability to make significant improvements in function in a reasonable and predictable amount of time.  ?Equipment Recommendations ?    ?  ?Recommendations for Other Services   ? ? ?  ?Precautions / Restrictions  Precautions ?Precautions: Fall ?Restrictions ?Weight Bearing Restrictions: No  ? ?  ? ?Mobility Bed Mobility ?Overal bed mobility: Modified Independent ?  ?  ?  ?  ?  ?  ?  ?  ? ?Transfers ?Overall transfer level: Modified independent ?Equipment used: Rolling walker (2 wheels) ?Transfers: Sit to/from Stand ?Sit to Stand: Modified independent (Device/Increase time) ?  ?  ?  ?  ?  ?  ?  ? ?  ?Balance Overall balance assessment: History of Falls, Mild deficits observed, not formally tested ?  ?  ?  ?  ?  ?  ?  ?  ?  ?  ?  ?  ?  ?  ?  ?  ?  ?  ?   ? ?ADL either performed or assessed with clinical judgement  ? ?ADL Overall ADL's : Needs assistance/impaired ?Eating/Feeding: Modified independent ?  ?Grooming: Modified independent;Sitting ?  ?Upper Body Bathing: Set up ?  ?Lower Body Bathing: Moderate assistance ?  ?Upper Body Dressing : Modified independent ?  ?Lower Body Dressing: Minimal assistance ?  ?  ?  ?Toileting- Water quality scientist and Hygiene: Min guard ?  ?  ?  ?Functional mobility during ADLs: Min guard;Rolling walker (2 wheels) ?General ADL Comments: Provided min guard for patient to return back to bed for a imaging test.  ? ? ? ?Vision Baseline Vision/History: 1 Wears glasses ?Patient Visual Report: No change from baseline ?   ?   ?Perception   ?  ?Praxis   ?  ? ?Pertinent Vitals/Pain Pain Assessment ?Pain Assessment: No/denies pain  ? ? ? ?Hand Dominance   ?  ?Extremity/Trunk Assessment Upper Extremity Assessment ?Upper Extremity Assessment: Generalized weakness ?  ?  Lower Extremity Assessment ?Lower Extremity Assessment: Defer to PT evaluation ?  ?  ?  ?Communication Communication ?Communication: No difficulties ?  ?Cognition Arousal/Alertness: Awake/alert ?Behavior During Therapy: [facility] for tasks assessed/performed ?Overall Cognitive Status: Within Functional Limits for tasks assessed ?  ?  ?  ?  ?  ?  ?  ?  ?  ?  ?  ?  ?  ?  ?  ?  ?General Comments: Pt with flat affect during evaluation ?  ?  ?General  Comments    ? ?  ?Exercises   ?  ?Shoulder Instructions    ? ? ?Home Living Family/patient expects to be discharged to:: Private residence ?Living Arrangements: Alone ?Available Help at Discharge: Personal care attendant;Family ?Type of Home: Independent living facility ?  ?  ?  ?Home Layout: One level ?  ?  ?Bathroom Shower/Tub: Walk-in shower ?  ?Bathroom Toilet: Handicapped height ?  ?  ?Home Equipment: Conservation officer, nature (2 wheels);Rollator (4 wheels);Shower seat - built in;Grab bars - toilet;Grab bars - tub/shower;Hand held shower head ?  ?  ?  ? ?  ?Prior Functioning/Environment Prior Level of Function : Independent/Modified Independent ?  ?  ?  ?  ?  ?  ?Mobility Comments: States he uses a rollator at all times ?ADLs Comments: Pt lives in an independent living facility but has a caregiver for 4 hours a day.  Some assist with bathing, dressing, household cleaning and meals. Caregiver escorts him to lunch meal and he reports he usually has enough lunch leftover for dinner.  He ambulates around his apartment with use of a rollator but has supervision when going outside of the apartment. ?  ? ?  ?  ?OT Problem List: Decreased strength;Decreased activity tolerance;Impaired balance (sitting and/or standing) ?  ?   ?OT Treatment/Interventions: Self-care/ADL training;Therapeutic exercise;Patient/family education;Balance training;Energy conservation;Therapeutic activities;DME and/or AE instruction  ?  ?OT Goals(Current goals can be found in the care plan section) Acute Rehab OT Goals ?Patient Stated Goal: to return home and be as independent as possible ?OT Goal Formulation: With patient ?Time For Goal Achievement: 07/27/21 ?Potential to Achieve Goals: Good ?ADL Goals ?Pt Will Perform Lower Body Dressing: with modified independence ?Pt Will Transfer to Toilet: with modified independence  ?OT Frequency: Min 2X/week ?  ? ?Co-evaluation   ?  ?  ?  ?  ? ?  ?AM-PAC OT "6 Clicks" Daily Activity     ?Outcome Measure Help from  another person eating meals?: None ?Help from another person taking care of personal grooming?: None ?Help from another person toileting, which includes using toliet, bedpan, or urinal?: A Little ?Help from another person bathing (including washing, rinsing, drying)?: A Little ?Help from another person to put on and taking off regular upper body clothing?: None ?Help from another person to put on and taking off regular lower body clothing?: A Little ?6 Click Score: 21 ?  ?End of Session Equipment Utilized During Treatment: Rolling walker (2 wheels);Gait belt ? ?Activity Tolerance: Patient tolerated treatment well ?Patient left: in bed;with call bell/phone within reach;Other (comment) (staff member from imaging in the room) ? ?OT Visit Diagnosis: Repeated falls (R29.6);Unsteadiness on feet (R26.81);Muscle weakness (generalized) (M62.81)  ?              ?Time: [id number] ?OT Time Calculation (min): 14 min ?Charges:  OT Evaluation ?$OT Eval Low Complexity: 1 Low ?Del Overfelt T Celest Reitz, OTR/L, CLT ? ?Sidney Kann ?07/13/2021, 12:04 PM ?

## 2021-07-13 NOTE — Discharge Instructions (Signed)
Please call to schedule follow up appointment with [facility] in [place], [place].  You will have the Zio monitor applied with this appointment. ?

## 2021-07-13 NOTE — Discharge Summary (Addendum)
Physician Discharge Summary   Patient: Charles Wilkinson DOB: 11-Sep-1945  Admit date:     07/12/2021  Discharge date: 07/13/21  Discharge Physician: Arnetha Courser   PCP: Mick Sell, MD   Recommendations at discharge:  Please obtain BMP and CBC in 1 week Patient is being started on aspirin and statin. Follow-up with primary care provider in 1 week Follow-up with outpatient neurology in 1 month Patient is also being given ZIO monitor-please make sure that he follow-up with cardiology for the results.  Discharge Diagnoses: Principal Problem:   Stroke [facility]) Active Problems:   Hyperlipidemia   Fall   Essential hypertension   NASH (nonalcoholic steatohepatitis)   Hypothyroidism   Pancytopenia (HCC)   Type II diabetes mellitus with renal manifestations (HCC)   Acute renal failure superimposed on stage 3a chronic kidney disease [facility])   Hospital Course: Taken from H&P.   Charles Wilkinson is a 76 y.o. male with medical history significant of hypertension, hyperlipidemia, diabetes mellitus, GERD, hypothyroidism, NASH, pancytopenia, CKD-3A, who presents with fall.   Pt states that he fell twice at home. First fall happened last night when he was getting out of bed, and slipped out of bed onto his buttocks. He states that he fell again this AM. He has a hematoma and abrasion to his left forehead. Patient denies unilateral numbness or tinglings in extremities. No facial droop or slurred speech. Per EDP, caregiver reported that the patient had some confusion this morning, which seem to have resolved.  When I saw patient in the ED, patient is alert, oriented x3.  Patient answered all questions appropriately. He denies chest pain, shortness breath, fever or chills.  Patient has some mild cough.  No nausea, vomiting, diarrhea or abdominal pain.  No symptoms of UTI.  Data Reviewed and ED Course: pt was found to have pancytopenia with hemoglobin 9.5, WBC 3.8, platelet 96 (patient had  hemoglobin 8.4, WBC 3.4, platelet 112 on 04/10/2021), negative urinalysis, worsening renal function with creatinine 1.60, BUN 36, GFR 44 (baseline creatinine 1.1-1.2), temperature normal, blood pressure 162/80, heart rate 67,  RR17, oxygen saturation 97% on room air. CT-head w/o contrast showed lacunar infarct in the left internal capsule believed to be subacute/chronic but new since prior [facility] in dec 2022. Patient is placed on MedSurg bed for patient, Dr. Derry Lory of of neurology is consulted.  MRI-brain and MRA of head and neck: Punctate acute cortical infarction at the right parietooccipital junction.  1 cm acute infarction affecting the left caudate/anterior limb internal capsule. No evidence of hemorrhage in either location.  Moderate chronic small-vessel ischemic changes elsewhere within the cerebral hemispheric white matter.   MR angiography done without contrast of the neck and head does not show any stenosis at either carotid bifurcation or any intracranial large vessel occlusion or correctable proximal stenosis. 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    EKG: I have personally reviewed.  Sinus rhythm, QTc 448, borderline left axis deviation, nonspecific T wave change.  Patient without any deficit.  Evaluated by PT and OT who recommended home health services.  Echocardiogram without any significant abnormality.  He was started on low-dose aspirin and Lipitor.  Neurology opted to avoid DAPT due to risk of fall. Neurologist suggested ZIO monitor.  Unable to obtained due to weekend.  Patient need to go to [facility] cardiology office to get it earlier next week.  Patient will continue current management and follow-up with his providers.  Consultants: Neurology Procedures performed: None Disposition:  Home health Diet recommendation:  Discharge Diet Orders (From admission, onward)     Start     Ordered   07/13/21 0000  Diet - low sodium heart healthy        07/13/21 1356            Cardiac diet DISCHARGE MEDICATION: Allergies as of 07/13/2021   No Known Allergies      Medication List     TAKE these medications    acetaminophen 325 MG tablet Commonly known as: TYLENOL Take 2 tablets (650 mg total) by mouth every 6 (six) hours as needed for mild pain or headache (fever >/= 101).   albuterol 108 (90 Base) MCG/ACT inhaler Commonly known as: VENTOLIN HFA Inhale 2 puffs into the lungs every 6 (six) hours as needed for wheezing or shortness of breath.   ascorbic acid 1000 MG tablet Commonly known as: VITAMIN C Take 1 tablet by mouth daily.   aspirin 81 MG EC tablet Take 1 tablet (81 mg total) by mouth daily. Swallow whole.   atorvastatin 40 MG tablet Commonly known as: LIPITOR Take 1 tablet (40 mg total) by mouth daily.   Cholecalciferol 50 MCG (2000 UT) Caps Take 2,000 Units by mouth daily.   furosemide 40 MG tablet Commonly known as: LASIX Take 40 mg by mouth every Monday, Wednesday, and Friday.   insulin aspart 100 UNIT/ML FlexPen Commonly known as: NOVOLOG Inject 2 Units into the skin in the morning, at noon, and at bedtime.   insulin glargine 100 UNIT/ML injection Commonly known as: LANTUS Inject 5 Units into the skin at bedtime.   Lecithin 1200 MG Caps Take 1 capsule by mouth daily.   levothyroxine 175 MCG tablet Commonly known as: SYNTHROID Take 175 mcg by mouth daily.   lisinopril 5 MG tablet Commonly known as: ZESTRIL Take 5 mg by mouth daily.   metFORMIN 500 MG tablet Commonly known as: GLUCOPHAGE Take 500 mg by mouth 2 (two) times daily.   Multi-Vitamin tablet Take 1 tablet by mouth daily.   Omega-3 Fish Oil 1200 MG Caps Take 1 capsule by mouth daily.   pantoprazole 40 MG tablet Commonly known as: PROTONIX Take 40 mg by mouth 2 (two) times daily.   potassium chloride 10 MEQ tablet Commonly known as: KLOR-CON Take 10 mEq by mouth daily.   spironolactone 50 MG tablet Commonly known as: ALDACTONE Take 50 mg  by mouth daily.   Vitamin E 268 MG (400 UNIT) Caps Take 1 tablet by mouth daily.   zinc sulfate 220 (50 Zn) MG capsule Take 1 capsule (220 mg total) by mouth daily.        Follow-up Information     Mick Sell, MD. Schedule an appointment as soon as possible for a visit in 1 week(s).   Specialty: Infectious Diseases Contact information: [address] [place] [address] [phone]         Lonell Face, MD. Schedule an appointment as soon as possible for a visit in 1 month(s).   Specialty: Neurology Contact information: [phone] [facility] [address] [facility] [place] [address] [phone]                Discharge Exam: Filed Weights   07/12/21 1009  Weight: 81.6 kg   General.     In no acute distress. Pulmonary.  Lungs clear bilaterally, normal respiratory effort. CV.  Regular rate and rhythm, no JVD, rub or murmur. Abdomen.  Soft, nontender, nondistended, BS positive.  CNS.  Alert and oriented .  No focal neurologic deficit. Extremities.  No edema, no cyanosis, pulses intact and symmetrical. Psychiatry.  Judgment and insight appears normal.   Condition at discharge: stable  The results of significant diagnostics from this hospitalization (including imaging, microbiology, ancillary and laboratory) are listed below for reference.   Imaging Studies: CT HEAD WO CONTRAST ( )  Result Date: 07/12/2021 CLINICAL DATA:  Head trauma, minor.  Hematoma to left side of head. EXAM: CT HEAD WITHOUT CONTRAST TECHNIQUE: Contiguous axial images were obtained from the base of the skull through the vertex without intravenous contrast. RADIATION DOSE REDUCTION: This exam was performed according to the departmental dose-optimization program which includes automated exposure control, adjustment of the mA and/or kV according to patient size and/or use of iterative reconstruction technique. COMPARISON:  Prior head CT examinations 04/01/2021 and  earlier. FINDINGS: Brain: Mild generalized cerebral atrophy. Lacunar infarct within the anterior limb of left internal capsule, new from the prior head CT of 04/01/2021 but subacute to chronic in appearance. Mild ill-defined hypoattenuation elsewhere within the cerebral white matter, nonspecific but compatible with chronic small vessel ischemic disease. There is no acute intracranial hemorrhage. No demarcated cortical infarct. No extra-axial fluid collection. No evidence of an intracranial mass. No midline shift. Vascular: No hyperdense vessel.  Atherosclerotic calcifications. Skull: Normal. Negative for fracture or focal lesion. Sinuses/Orbits: Visualized orbits show no acute finding. Postsurgical changes to the lateral bony orbits, bilaterally. Redemonstrated chronic fracture deformities of the right maxillary sinus, left orbital floor left lamina papyracea and nasal bones. Moderate mucosal thickening within the right maxillary sinus. Mild mucosal thickening within the bilateral maxillary and ethmoid sinuses. IMPRESSION: No acute posttraumatic intracranial findings. Lacunar infarct within the anterior limb of left internal capsule, new from the prior head CT of 04/01/2021, but subacute to chronic in appearance. A brain MRI may be obtained for further evaluation, as clinically warranted. Background mild chronic small ischemic changes within the cerebral white matter. Mild generalized cerebral atrophy. Paranasal sinus disease, as described. Electronically Signed   By: Jackey Loge D.O.   On: 07/12/2021 11:00   MR ANGIO HEAD WO CONTRAST  Result Date: 07/12/2021 CLINICAL DATA:  Neuro deficit, acute, stroke suspected. Fell from bed. Confusion. EXAM: MRI HEAD WITHOUT CONTRAST MRA HEAD WITHOUT CONTRAST MRA NECK WITHOUT CONTRAST TECHNIQUE: Multiplanar, multiecho pulse sequences of the brain and surrounding structures were obtained without intravenous contrast. Angiographic images of the Circle of Willis were obtained  using MRA technique without intravenous contrast. Angiographic images of the neck were obtained using MRA technique without intravenous contrast. Carotid stenosis measurements (when applicable) are obtained utilizing NASCET criteria, using the distal internal carotid diameter as the denominator. COMPARISON:  Head CT earlier same day FINDINGS: MRI HEAD FINDINGS Brain: Diffusion imaging shows a punctate acute infarction at the cortical surface of the right parietooccipital junction. There is a 1 cm acute infarction within the left caudate/anterior limb internal capsule. No large vessel territory infarction. Chronic small-vessel ischemic changes affect the cerebral hemispheric white matter, moderate in degree. No focal abnormality affects the brainstem or cerebellum. No hydrocephalus, hemorrhage or extra-axial collection. Vascular: Major vessels at the base of the brain show flow. Skull and upper cervical spine: Negative Sinuses/Orbits: Clear/normal Other: None MRA HEAD FINDINGS Both internal carotid arteries are patent through the skull base and siphon regions. The anterior and middle cerebral vessels are patent. Diminutive left A1 segment, probably congenital. No sign of large vessel infarction. No aneurysm or vascular malformation. Both vertebral arteries are  widely patent to the basilar. No basilar stenosis. Posterior circulation branch vessels show flow. MRA NECK FINDINGS Both common carotid arteries are patent to the bifurcation level. No flow limiting stenosis suspected at either carotid bifurcation. Cervical internal carotid arteries show flow. Antegrade flow is present within both vertebral arteries, the left being dominant. IMPRESSION: Punctate acute cortical infarction at the right parietooccipital junction. 1 cm acute infarction affecting the left caudate/anterior limb internal capsule. No evidence of hemorrhage in either location. Moderate chronic small-vessel ischemic changes elsewhere within the cerebral  hemispheric white matter. MR angiography done without contrast of the neck and head does not show any stenosis at either carotid bifurcation or any intracranial large vessel occlusion or correctable proximal stenosis. 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 Electronically Signed   By: Paulina Fusi M.D.   On: 07/12/2021 14:59   MR ANGIO NECK WO CONTRAST  Result Date: 07/12/2021 CLINICAL DATA:  Neuro deficit, acute, stroke suspected. Fell from bed. Confusion. EXAM: MRI HEAD WITHOUT CONTRAST MRA HEAD WITHOUT CONTRAST MRA NECK WITHOUT CONTRAST TECHNIQUE: Multiplanar, multiecho pulse sequences of the brain and surrounding structures were obtained without intravenous contrast. Angiographic images of the Circle of Willis were obtained using MRA technique without intravenous contrast. Angiographic images of the neck were obtained using MRA technique without intravenous contrast. Carotid stenosis measurements (when applicable) are obtained utilizing NASCET criteria, using the distal internal carotid diameter as the denominator. COMPARISON:  Head CT earlier same day FINDINGS: MRI HEAD FINDINGS Brain: Diffusion imaging shows a punctate acute infarction at the cortical surface of the right parietooccipital junction. There is a 1 cm acute infarction within the left caudate/anterior limb internal capsule. No large vessel territory infarction. Chronic small-vessel ischemic changes affect the cerebral hemispheric white matter, moderate in degree. No focal abnormality affects the brainstem or cerebellum. No hydrocephalus, hemorrhage or extra-axial collection. Vascular: Major vessels at the base of the brain show flow. Skull and upper cervical spine: Negative Sinuses/Orbits: Clear/normal Other: None MRA HEAD FINDINGS Both internal carotid arteries are patent through the skull base and siphon regions. The anterior and middle cerebral vessels are patent. Diminutive left A1 segment, probably congenital. No sign of large  vessel infarction. No aneurysm or vascular malformation. Both vertebral arteries are widely patent to the basilar. No basilar stenosis. Posterior circulation branch vessels show flow. MRA NECK FINDINGS Both common carotid arteries are patent to the bifurcation level. No flow limiting stenosis suspected at either carotid bifurcation. Cervical internal carotid arteries show flow. Antegrade flow is present within both vertebral arteries, the left being dominant. IMPRESSION: Punctate acute cortical infarction at the right parietooccipital junction. 1 cm acute infarction affecting the left caudate/anterior limb internal capsule. No evidence of hemorrhage in either location. Moderate chronic small-vessel ischemic changes elsewhere within the cerebral hemispheric white matter. MR angiography done without contrast of the neck and head does not show any stenosis at either carotid bifurcation or any intracranial large vessel occlusion or correctable proximal stenosis. 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 Electronically Signed   By: Paulina Fusi M.D.   On: 07/12/2021 14:59   MR BRAIN WO CONTRAST  Result Date: 07/12/2021 CLINICAL DATA:  Neuro deficit, acute, stroke suspected. Fell from bed. Confusion. EXAM: MRI HEAD WITHOUT CONTRAST MRA HEAD WITHOUT CONTRAST MRA NECK WITHOUT CONTRAST TECHNIQUE: Multiplanar, multiecho pulse sequences of the brain and surrounding structures were obtained without intravenous contrast. Angiographic images of the Circle of Willis were obtained using MRA technique without intravenous contrast. Angiographic images of  the neck were obtained using MRA technique without intravenous contrast. Carotid stenosis measurements (when applicable) are obtained utilizing NASCET criteria, using the distal internal carotid diameter as the denominator. COMPARISON:  Head CT earlier same day FINDINGS: MRI HEAD FINDINGS Brain: Diffusion imaging shows a punctate acute infarction at the cortical  surface of the right parietooccipital junction. There is a 1 cm acute infarction within the left caudate/anterior limb internal capsule. No large vessel territory infarction. Chronic small-vessel ischemic changes affect the cerebral hemispheric white matter, moderate in degree. No focal abnormality affects the brainstem or cerebellum. No hydrocephalus, hemorrhage or extra-axial collection. Vascular: Major vessels at the base of the brain show flow. Skull and upper cervical spine: Negative Sinuses/Orbits: Clear/normal Other: None MRA HEAD FINDINGS Both internal carotid arteries are patent through the skull base and siphon regions. The anterior and middle cerebral vessels are patent. Diminutive left A1 segment, probably congenital. No sign of large vessel infarction. No aneurysm or vascular malformation. Both vertebral arteries are widely patent to the basilar. No basilar stenosis. Posterior circulation branch vessels show flow. MRA NECK FINDINGS Both common carotid arteries are patent to the bifurcation level. No flow limiting stenosis suspected at either carotid bifurcation. Cervical internal carotid arteries show flow. Antegrade flow is present within both vertebral arteries, the left being dominant. IMPRESSION: Punctate acute cortical infarction at the right parietooccipital junction. 1 cm acute infarction affecting the left caudate/anterior limb internal capsule. No evidence of hemorrhage in either location. Moderate chronic small-vessel ischemic changes elsewhere within the cerebral hemispheric white matter. MR angiography done without contrast of the neck and head does not show any stenosis at either carotid bifurcation or any intracranial large vessel occlusion or correctable proximal stenosis. 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 Electronically Signed   By: Paulina Fusi M.D.   On: 07/12/2021 14:59   ECHOCARDIOGRAM COMPLETE  Result Date: 07/13/2021    ECHOCARDIOGRAM REPORT   Patient Name:    Charles Wilkinson Date of Exam: 07/13/2021 Medical Rec #:  [id number]    Height:       70.0 in Accession #:    [id number]   Weight:       180.0 lb Date of Birth:  [birth year]     BSA:          1.996 m Patient Age:    75 years     BP:           137/69 mmHg Patient Gender: M            HR:           59 bpm. Exam Location:  [facility] Procedure: 2D Echo Indications:     Stroke I63.9  History:         Patient has prior history of Echocardiogram examinations, most                  recent 04/02/2021.  Sonographer:     Overton Mam RDCS Referring Phys:  Wynona Neat NIU Diagnosing Phys: Adrian Blackwater IMPRESSIONS  1. Left ventricular ejection fraction, by estimation, is 60 to 65%. The left ventricle has normal function. The left ventricle has no regional wall motion abnormalities. Left ventricular diastolic parameters were normal.  2. Right ventricular systolic function is normal. The right ventricular size is normal.  3. The mitral valve is normal in structure. No evidence of mitral valve regurgitation. No evidence of mitral stenosis.  4. The aortic valve is normal in structure. Aortic valve regurgitation is not visualized.  No aortic stenosis is present.  5. The inferior vena cava is normal in size with greater than 50% respiratory variability, suggesting right atrial pressure of 3 mmHg. FINDINGS  Left Ventricle: Left ventricular ejection fraction, by estimation, is 60 to 65%. The left ventricle has normal function. The left ventricle has no regional wall motion abnormalities. The left ventricular internal cavity size was normal in size. There is  no left ventricular hypertrophy. Left ventricular diastolic parameters were normal. Right Ventricle: The right ventricular size is normal. No increase in right ventricular wall thickness. Right ventricular systolic function is normal. Left Atrium: Left atrial size was normal in size. Right Atrium: Right atrial size was normal in size. Pericardium: There is no evidence of pericardial effusion.  Mitral Valve: The mitral valve is normal in structure. No evidence of mitral valve regurgitation. No evidence of mitral valve stenosis. Tricuspid Valve: The tricuspid valve is normal in structure. Tricuspid valve regurgitation is not demonstrated. No evidence of tricuspid stenosis. Aortic Valve: The aortic valve is normal in structure. Aortic valve regurgitation is not visualized. No aortic stenosis is present. Aortic valve peak gradient measures 7.2 mmHg. Pulmonic Valve: The pulmonic valve was normal in structure. Pulmonic valve regurgitation is not visualized. No evidence of pulmonic stenosis. Aorta: The aortic root is normal in size and structure. Venous: The inferior vena cava is normal in size with greater than 50% respiratory variability, suggesting right atrial pressure of 3 mmHg. IAS/Shunts: No atrial level shunt detected by color flow Doppler.  LEFT VENTRICLE PLAX 2D LVIDd:         5.00 cm   Diastology LVIDs:         3.30 cm   LV e' medial:    6.31 cm/s LV PW:         1.00 cm   LV E/e' medial:  10.9 LV IVS:        1.00 cm   LV e' lateral:   8.16 cm/s LVOT diam:     2.50 cm   LV E/e' lateral: 8.5 LV SV:         95 LV SV Index:   47 LVOT Area:     4.91 cm  RIGHT VENTRICLE RV Basal diam:  2.80 cm RV S prime:     13.10 cm/s TAPSE (M-mode): 2.0 cm LEFT ATRIUM           Index        RIGHT ATRIUM           Index LA diam:      3.60 cm 1.80 cm/m   RA Area:     11.00 cm LA Vol (A2C): 33.8 ml 16.93 ml/m  RA Volume:   18.00 ml  9.02 ml/m LA Vol (A4C): 30.0 ml 15.03 ml/m  AORTIC VALVE                 PULMONIC VALVE AV Area (Vmax): 3.08 cm     PV Vmax:       1.05 m/s AV Vmax:        134.00 cm/s  PV Peak grad:  4.4 mmHg AV Peak Grad:   7.2 mmHg LVOT Vmax:      84.20 cm/s LVOT Vmean:     56.400 cm/s LVOT VTI:       0.193 m  AORTA Ao Root diam: 3.40 cm Ao Asc diam:  3.50 cm MITRAL VALVE MV Area (PHT): 2.84 cm    SHUNTS MV Decel Time: 267 msec  Systemic VTI:  0.19 m MV E velocity: 69.00 cm/s  Systemic Diam: 2.50 cm  MV A velocity: 69.00 cm/s MV E/A ratio:  1.00 Shaukat Khan Electronically signed by Adrian Blackwater Signature Date/Time: 07/13/2021/12:02:47 PM    Final     Microbiology: Results for orders placed or performed during the hospital encounter of 07/12/21  Resp Panel by RT-PCR (Flu A&B, Covid) Nasopharyngeal Swab     Status: None   Collection Time: 07/12/21 11:04 AM   Specimen: Nasopharyngeal Swab; Nasopharyngeal(NP) swabs in vial transport medium  Result Value Ref Range Status   SARS Coronavirus 2 by RT PCR NEGATIVE NEGATIVE Final    Comment: (NOTE) SARS-CoV-2 target nucleic acids are NOT DETECTED.  The SARS-CoV-2 RNA is generally detectable in upper respiratory specimens during the acute phase of infection. The lowest concentration of SARS-CoV-2 viral copies this assay can detect is 138 copies/mL. A negative result does not preclude SARS-Cov-2 infection and should not be used as the sole basis for treatment or other patient management decisions. A negative result may occur with  improper specimen collection/handling, submission of specimen other than nasopharyngeal swab, presence of viral mutation(s) within the areas targeted by this assay, and inadequate number of viral copies(<138 copies/mL). A negative result must be combined with clinical observations, patient history, and epidemiological information. The expected result is Negative.  Fact Sheet for Patients:  BloggerCourse.com  Fact Sheet for Healthcare Providers:  SeriousBroker.it  This test is no t yet approved or cleared by the [place] FDA and  has been authorized for detection and/or diagnosis of SARS-CoV-2 by FDA under an Emergency Use Authorization (EUA). This EUA will remain  in effect (meaning this test can be used) for the duration of the COVID-19 declaration under Section 564(b)(1) of the Act, 21 U.S.C.section 360bbb-3(b)(1), unless the authorization is terminated  or  revoked sooner.       Influenza A by PCR NEGATIVE NEGATIVE Final   Influenza B by PCR NEGATIVE NEGATIVE Final    Comment: (NOTE) The Xpert Xpress SARS-CoV-2/FLU/RSV plus assay is intended as an aid in the diagnosis of influenza from Nasopharyngeal swab specimens and should not be used as a sole basis for treatment. Nasal washings and aspirates are unacceptable for Xpert Xpress SARS-CoV-2/FLU/RSV testing.  Fact Sheet for Patients: BloggerCourse.com  Fact Sheet for Healthcare Providers: SeriousBroker.it  This test is not yet approved or cleared by the [place] FDA and has been authorized for detection and/or diagnosis of SARS-CoV-2 by FDA under an Emergency Use Authorization (EUA). This EUA will remain in effect (meaning this test can be used) for the duration of the COVID-19 declaration under Section 564(b)(1) of the Act, 21 U.S.C. section 360bbb-3(b)(1), unless the authorization is terminated or revoked.  Performed at [facility], [address] [place], [place] [address]     Labs: CBC: Recent Labs  Lab 07/12/21 1056  WBC 3.8*  HGB 9.5*  HCT 30.1*  MCV 86.7  PLT 96*   Basic Metabolic Panel: Recent Labs  Lab 07/12/21 1056 07/13/21 0537  NA 135 137  K 4.4 3.9  CL 104 109  CO2 21* 21*  GLUCOSE 233* 105*  BUN 36* 27*  CREATININE 1.60* 1.21  CALCIUM 8.7* 8.2*   Liver Function Tests: Recent Labs  Lab 07/12/21 1056  AST 22  ALT 16  ALKPHOS 82  BILITOT 1.1  PROT 6.5  ALBUMIN 3.0*   CBG: Recent Labs  Lab 07/12/21 1637 07/12/21 1951 07/13/21 0753 07/13/21 1132  GLUCAP 176*  183* 110* 161*    Discharge time spent: greater than 30 minutes.  This record has been created using Conservation officer, historic buildings. Errors have been sought and corrected,but may not always be located. Such creation errors do not reflect on the standard of care.   Signed: Arnetha Courser, MD [facility] 07/13/2021

## 2021-07-13 NOTE — Evaluation (Addendum)
Physical Therapy Evaluation ?Patient Details ?Name: Charles Wilkinson ?MRN: [id number] ?DOB: 08-24-1945 ?Today's Date: 07/13/2021 ? ?History of Present Illness ? Pt is a 76 y.o. male presenting to hospital 4/7 s/p fall (pt slipped OOB onto buttocks and then next day noted to have abrasion/hematoma to L forehead but pt unable to recall what happened) and concern for confusion (now resolved).  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  Pt admitted with stroke and fall.  PMH includes htn, HLD, DM, NASH, pancytopenia, and CKD-3A.  ?Clinical Impression ? Prior to hospital admission, pt was ambulatory with rollator (modified independent in home but always has someone walk with him when he leaves his apt and walks to dining hall for 1 meal per day); lives at Independent Living facility.  Currently pt is modified independent semi-supine to sitting edge of bed; modified independent with transfers; and CGA ambulating 120 feet with RW use.  L lean noted during ambulation but pt steady and safe with all functional mobility during sessions activities.  Pt would benefit from skilled PT to address noted impairments and functional limitations (see below for any additional details).  Upon hospital discharge, pt would benefit from HHPT.   ? ?Recommendations for follow up therapy are one component of a multi-disciplinary discharge planning process, led by the attending physician.  Recommendations may be updated based on patient status, additional functional criteria and insurance authorization. ? ?Follow Up Recommendations Home health PT ? ?  ?Assistance Recommended at Discharge Intermittent Supervision/Assistance  ?Patient can return home with the following ? A little help with walking and/or transfers;A little help with bathing/dressing/bathroom;Assistance with cooking/housework;Assist for transportation;Help with stairs  or ramp for entrance ? ?  ?Equipment Recommendations Rolling walker (2 wheels)  ?Recommendations for Other Services ?    ?  ?Functional Status Assessment Patient has had a recent decline in their functional status and demonstrates the ability to make significant improvements in function in a reasonable and predictable amount of time.  ? ?  ?Precautions / Restrictions Precautions ?Precautions: Fall ?Restrictions ?Weight Bearing Restrictions: No  ? ?  ? ?Mobility ? Bed Mobility ?Overal bed mobility: Modified Independent ?  ?  ?  ?  ?  ?  ?General bed mobility comments: Semi-supine to sitting edge of bed without any noted difficulties ?  ? ?Transfers ?Overall transfer level: Modified independent ?Equipment used: None ?Transfers: Sit to/from Stand ?Sit to Stand: Modified independent (Device/Increase time) ?  ?  ?  ?  ?  ?General transfer comment: pt stood up from bed without any UE support; steady and safe; mild increased effort to perform on own ?  ? ?Ambulation/Gait ?Ambulation/Gait assistance: Min guard ?Gait Distance (Feet): 120 Feet ?Assistive device: Rolling walker (2 wheels) ?Gait Pattern/deviations: Step-through pattern ?Gait velocity: decreased ?  ?  ?General Gait Details: mild L lean noted but pt steady and safe (pt reports d/t h/o L LE injury) ? ?Stairs ?  ?  ?  ?  ?  ? ?Wheelchair Mobility ?  ? ?Modified Rankin (Stroke Patients Only) ?  ? ?  ? ?Balance Overall balance assessment: History of Falls, Needs assistance ?Sitting-balance support: No upper extremity supported, Feet supported ?Sitting balance-Leahy Scale: Normal ?Sitting balance - Comments: steady sitting reaching outside BOS ?  ?Standing balance support: Bilateral upper extremity supported, During functional activity, Reliant on assistive device for balance ?Standing balance-Leahy Scale: Good ?Standing balance comment: steady ambulating with B UE  support on RW ?  ?  ?  ?  ?  ?  ?  ?  ?  ?  ?  ?   ? ? ? ?Pertinent Vitals/Pain Pain Assessment ?Pain  Assessment: No/denies pain ?Vitals (HR and O2 on room air) stable and WFL throughout treatment session.  ? ? ?Home Living Family/patient expects to be discharged to:: Private residence ?Living Arrangements: Alone ?Available Help at Discharge: Personal care attendant;Family ?Type of Home: Independent living facility [facility]) ?Home Access: Elevator (takes elevator to 2nd floor dining room) ?  ?  ?  ?Home Layout: One level ?Home Equipment: Rollator (4 wheels);Grab bars - toilet;Grab bars - tub/shower;Hand held shower head;Tub bench ?   ?  ?Prior Function Prior Level of Function : Needs assist ?  ?  ?  ?  ?  ?  ?Mobility Comments: Modified independent ambulating with rollator within apt (currently has someone walk with him when outside apt for safety) ?ADLs Comments: Caregiver assist 7 days a week from 8am-noon; assist for bathing, laundry, walking to/from dining hall. ?  ? ? ?Hand Dominance  ?   ? ?  ?Extremity/Trunk Assessment  ? Upper Extremity Assessment ?Upper Extremity Assessment: Defer to OT evaluation;Generalized weakness ?  ? ?Lower Extremity Assessment ?Lower Extremity Assessment:  (4/5 B hip flexion, knee flexion/extension, and at least 3/5 AROM ankle DF/PF; intact B LE light touch, heel to shin coordination, proprioception, and tone) ?  ? ?Cervical / Trunk Assessment ?Cervical / Trunk Assessment: Normal  ?Communication  ? Communication: No difficulties  ?Cognition Arousal/Alertness: Awake/alert ?Behavior During Therapy: [facility] for tasks assessed/performed ?Overall Cognitive Status: Within Functional Limits for tasks assessed ?  ?  ?  ?  ?  ?  ?  ?  ?  ?  ?  ?  ?  ?  ?  ?  ?General Comments: Flat affect noted ?  ?  ? ?  ?General Comments General comments (skin integrity, edema, etc.): L forehead abrasion noted.  Nursing cleared pt for participation in physical therapy.  Pt agreeable to PT session. ? ?  ?Exercises    ? ?Assessment/Plan  ?  ?PT Assessment Patient needs continued PT services  ?PT Problem List  Decreased strength;Decreased balance;Decreased mobility ? ?   ?  ?PT Treatment Interventions DME instruction;Gait training;Functional mobility training;Therapeutic activities;Therapeutic exercise;Balance training;Patient/family education   ? ?PT Goals (Current goals can be found in the Care Plan section)  ?Acute Rehab PT Goals ?Patient Stated Goal: to improve strength and mobility ?PT Goal Formulation: With patient ?Time For Goal Achievement: 07/27/21 ?Potential to Achieve Goals: Good ? ?  ?Frequency 7X/week ?  ? ? ?Co-evaluation   ?  ?  ?  ?  ? ? ?  ?AM-PAC PT "6 Clicks" Mobility  ?Outcome Measure Help needed turning from your back to your side while in a flat bed without using bedrails?: None ?Help needed moving from lying on your back to sitting on the side of a flat bed without using bedrails?: None ?Help needed moving to and from a bed to a chair (including a wheelchair)?: None ?Help needed standing up from a chair using your arms (e.g., wheelchair or bedside chair)?: None ?Help needed to walk in hospital room?: A Little ?Help needed climbing 3-5 steps with a railing? : A Lot ?6 Click Score: 21 ? ?  ?End of Session Equipment Utilized During Treatment: Gait belt ?Activity Tolerance: Patient tolerated treatment well ?Patient left: in chair;with call bell/phone within reach;with chair alarm set ?  Nurse Communication: Mobility status;Precautions ?PT Visit Diagnosis: Other abnormalities of gait and mobility (R26.89);Muscle weakness (generalized) (M62.81);History of falling (Z91.81) ?  ? ?Time: 5621-30860957-1027 ?PT Time Calculation (min) (ACUTE ONLY): 30 min ? ? ?Charges:   PT Evaluation ?$PT Eval Low Complexity: 1 Low ?PT Treatments ?$Therapeutic Activity: 8-22 mins ?  ?   ? ?Hendricks LimesEmily Deaja Rizo, PT ?07/13/21, 12:47 PM ? ? ?

## 2021-07-13 NOTE — TOC Progression Note (Signed)
Transition of Care (TOC) - Progression Note  ? ? ?Patient Details  ?Name: Charles Wilkinson ?MRN: [id number] ?Date of Birth: [birth year] ? ?Transition of Care (TOC) CM/SW Contact  ?[name], LCSW ?Phone Number: ?07/13/2021, 2:19 PM ? ?Clinical Narrative:   Advanced will service pt at d/c. ? ? ? ?  ?  ? ?Expected Discharge Plan and Services ?  ?  ?  ?  ?  ?Expected Discharge Date: 07/13/21               ?  ?  ?  ?  ?  ?  ?  ?  ?  ?  ? ? ?Social Determinants of Health (SDOH) Interventions ?  ? ?Readmission Risk Interventions ?   ? View : No data to display.  ?  ?  ?  ? ? ?

## 2021-07-15 ENCOUNTER — Telehealth: Payer: Self-pay

## 2021-07-15 NOTE — Telephone Encounter (Signed)
-----   Message from Vesta Mixer, MD sent at 07/13/2021  3:08 PM EDT ----- ?Hi Triage, ? ?Charles Wilkinson will need an appt with the [place] office this week if possible . ?Follow up for strokes ?He will need an event monitor ?Can it be placed in our [place] office. ?Dr. Nelson Chimes is not sure he will be able to place it my himself. ?Thanks ? ?PN ? ? ?

## 2021-07-15 NOTE — Telephone Encounter (Signed)
Per chart review, patient has not had a cardiology consult -- would be new to [facility] ? ?Message sent to scheduling team ?

## 2021-07-15 NOTE — Telephone Encounter (Signed)
Scheduled 4/14

## 2021-07-19 ENCOUNTER — Ambulatory Visit: Payer: Medicare Other | Admitting: Cardiology

## 2021-07-22 ENCOUNTER — Encounter: Payer: Self-pay | Admitting: Cardiology

## 2021-07-24 ENCOUNTER — Ambulatory Visit: Payer: Medicare Other | Admitting: Cardiology

## 2021-07-24 ENCOUNTER — Encounter: Payer: Self-pay | Admitting: Cardiology

## 2021-07-24 ENCOUNTER — Ambulatory Visit (INDEPENDENT_AMBULATORY_CARE_PROVIDER_SITE_OTHER): Payer: Medicare Other

## 2021-07-24 VITALS — BP 147/66 | HR 55 | Ht 70.0 in | Wt 182.6 lb

## 2021-07-24 DIAGNOSIS — Z8673 Personal history of transient ischemic attack (TIA), and cerebral infarction without residual deficits: Secondary | ICD-10-CM | POA: Diagnosis not present

## 2021-07-24 DIAGNOSIS — Z79899 Other long term (current) drug therapy: Secondary | ICD-10-CM | POA: Diagnosis not present

## 2021-07-24 DIAGNOSIS — I10 Essential (primary) hypertension: Secondary | ICD-10-CM | POA: Diagnosis not present

## 2021-07-24 DIAGNOSIS — E1169 Type 2 diabetes mellitus with other specified complication: Secondary | ICD-10-CM | POA: Diagnosis not present

## 2021-07-24 DIAGNOSIS — E782 Mixed hyperlipidemia: Secondary | ICD-10-CM

## 2021-07-24 DIAGNOSIS — E785 Hyperlipidemia, unspecified: Secondary | ICD-10-CM

## 2021-07-24 MED ORDER — LOSARTAN POTASSIUM 25 MG PO TABS: 25.0000 mg | ORAL_TABLET | Freq: Every day | ORAL | 3 refills | Status: DC

## 2021-07-24 NOTE — Progress Notes (Unsigned)
Enrolled patient for a 14 day Zio XT  monitor to be mailed to patients home  °

## 2021-07-24 NOTE — Progress Notes (Signed)
?Cardiology Office Note:   ? ?Date:  07/26/2021  ? ?ID:  Charles Wilkinson, DOB 11-01-1945, MRN [id number] ? ?PCP:  Leonel Ramsay, MD  ?Cardiologist:  None  ?Electrophysiologist:  None  ? ?Referring MD: Leonel Ramsay, MD  ? ?" I am doing ok" ? ? ?History of Present Illness:   ? ?Charles Wilkinson is a 76 y.o. male with a hx of diabetes mellitus, GERD, hyperlipidemia, hypertension, history of CVA she is documented from hospitalization in April 2023 at which time he was noted 1 cm acute infarct affecting the left caudate and anterior limb of the internal capsule.  Posthospitalization he was asked to see cardiology to wear a monitor to make sure that arrhythmia was not playing a role here.  Since his hospitalization he and his aide tells me that he has been doing well.  He has not had any chest pain shortness of breath or any palpitations that he recalls. ? ?Past Medical History:  ?Diagnosis Date  ? Diabetes mellitus without complication ([place])   ? GERD (gastroesophageal reflux disease)   ? Hyperlipidemia   ? Hypertension   ? Hypothyroidism   ? NASH (nonalcoholic steatohepatitis)   ? ? ?Past Surgical History:  ?Procedure Laterality Date  ? APPENDECTOMY    ? HERNIA REPAIR    ? ? ?Current Medications: ?Current Meds  ?Medication Sig  ? acetaminophen (TYLENOL) 325 MG tablet Take 2 tablets (650 mg total) by mouth every 6 (six) hours as needed for mild pain or headache (fever >/= 101).  ? albuterol (VENTOLIN HFA) 108 (90 Base) MCG/ACT inhaler Inhale 2 puffs into the lungs every 6 (six) hours as needed for wheezing or shortness of breath.  ? ascorbic acid (VITAMIN C) 1000 MG tablet Take 1 tablet by mouth daily.  ? aspirin EC 81 MG EC tablet Take 1 tablet (81 mg total) by mouth daily. Swallow whole.  ? atorvastatin (LIPITOR) 40 MG tablet Take 1 tablet (40 mg total) by mouth daily.  ? Cholecalciferol 50 MCG (2000 UT) CAPS Take 2,000 Units by mouth daily.  ? furosemide (LASIX) 40 MG tablet Take 40 mg by mouth every Monday,  Wednesday, and Friday.  ? levothyroxine (SYNTHROID) 175 MCG tablet Take 175 mcg by mouth daily.  ? losartan (COZAAR) 25 MG tablet Take 1 tablet (25 mg total) by mouth daily.  ? metFORMIN (GLUCOPHAGE) 500 MG tablet Take 1,500 mg by mouth daily. 1,000mg  in the morning 500mg  at night  ? Multiple Vitamin (MULTI-VITAMIN) tablet Take 1 tablet by mouth daily.  ? Omega-3 Fatty Acids (OMEGA-3 FISH OIL) 1200 MG CAPS Take 1 capsule by mouth daily.  ? pantoprazole (PROTONIX) 40 MG tablet Take 40 mg by mouth 2 (two) times daily.  ? spironolactone (ALDACTONE) 50 MG tablet Take 50 mg by mouth daily.  ? Vitamin E 268 MG (400 UNIT) CAPS Take 1 tablet by mouth daily.  ? zinc sulfate 220 (50 Zn) MG capsule Take 1 capsule (220 mg total) by mouth daily.  ? [DISCONTINUED] lisinopril (ZESTRIL) 5 MG tablet Take 5 mg by mouth daily.  ? [DISCONTINUED] potassium chloride (KLOR-CON) 10 MEQ tablet Take 10 mEq by mouth daily.  ?  ? ?Allergies:   Patient has no known allergies.  ? ?Social History  ? ?Socioeconomic History  ? Marital status: Widowed  ?  Spouse name: Not on file  ? Number of children: Not on file  ? Years of education: Not on file  ? Highest education level: Not on file  ?Occupational  History  ? Not on file  ?Tobacco Use  ? Smoking status: Former  ?  Types: Cigarettes  ? Smokeless tobacco: Never  ?Vaping Use  ? Vaping Use: Never used  ?Substance and Sexual Activity  ? Alcohol use: Not Currently  ? Drug use: Never  ? Sexual activity: Not on file  ?Other Topics Concern  ? Not on file  ?Social History Narrative  ? Not on file  ? ?Social Determinants of Health  ? ?Financial Resource Strain: Not on file  ?Food Insecurity: Not on file  ?Transportation Needs: Not on file  ?Physical Activity: Not on file  ?Stress: Not on file  ?Social Connections: Not on file  ?  ? ?Family History: ?The patient's family history includes CAD in his mother; Diabetes in his mother. ? ?ROS:   ?Review of Systems  ?Constitution: Negative for decreased appetite,  fever and weight gain.  ?HENT: Negative for congestion, ear discharge, hoarse voice and sore throat.   ?Eyes: Negative for discharge, redness, vision loss in right eye and visual halos.  ?Cardiovascular: Negative for chest pain, dyspnea on exertion, leg swelling, orthopnea and palpitations.  ?Respiratory: Negative for cough, hemoptysis, shortness of breath and snoring.   ?Endocrine: Negative for heat intolerance and polyphagia.  ?Hematologic/Lymphatic: Negative for bleeding problem. Does not bruise/bleed easily.  ?Skin: Negative for flushing, nail changes, rash and suspicious lesions.  ?Musculoskeletal: Negative for arthritis, joint pain, muscle cramps, myalgias, neck pain and stiffness.  ?Gastrointestinal: Negative for abdominal pain, bowel incontinence, diarrhea and excessive appetite.  ?Genitourinary: Negative for decreased libido, genital sores and incomplete emptying.  ?Neurological: Negative for brief paralysis, focal weakness, headaches and loss of balance.  ?Psychiatric/Behavioral: Negative for altered mental status, depression and suicidal ideas.  ?Allergic/Immunologic: Negative for HIV exposure and persistent infections.  ? ? ?EKGs/Labs/Other Studies Reviewed:   ? ?The following studies were reviewed today: ? ? ?EKG:  None today  ? ?TTE Impressions 07/13/2021 ? 1. Left ventricular ejection fraction, by estimation, is 60 to 65%. The left ventricle has normal function. The left ventricle has no regional wall motion abnormalities. Left ventricular diastolic parameters were normal.  ? 2. Right ventricular systolic function is normal. The right ventricular size is normal.  ? 3. The mitral valve is normal in structure. No evidence of mitral valve regurgitation. No evidence of mitral stenosis.  ? 4. The aortic valve is normal in structure. Aortic valve regurgitation is not visualized. No aortic stenosis is present.  ? 5. The inferior vena cava is normal in size with greater than 50% respiratory variability,  suggesting right atrial pressure of 3 mmHg.  ? ?FINDINGS  ? Left Ventricle: Left ventricular ejection fraction, by estimation, is 60 to 65%. The left ventricle has normal function. The left ventricle has no regional wall motion abnormalities. The left ventricular internal cavity size was normal in size. There is  ? no left ventricular hypertrophy. Left ventricular diastolic parameters were normal.  ? ?Right Ventricle: The right ventricular size is normal. No increase in right ventricular wall thickness. Right ventricular systolic function is normal.  ? ?Left Atrium: Left atrial size was normal in size.  ? ?Right Atrium: Right atrial size was normal in size.  ? ?Pericardium: There is no evidence of pericardial effusion.  ? ?Mitral Valve: The mitral valve is normal in structure. No evidence of mitral valve regurgitation. No evidence of mitral valve stenosis.  ? ?Tricuspid Valve: The tricuspid valve is normal in structure. Tricuspid valve regurgitation is not demonstrated. No evidence  of tricuspid stenosis.  ? ?Aortic Valve: The aortic valve is normal in structure. Aortic valve  ?regurgitation is not visualized. No aortic stenosis is present. Aortic  ?valve peak gradient measures 7.2 mmHg.  ? ?Pulmonic Valve: The pulmonic valve was normal in structure. Pulmonic valve  ?regurgitation is not visualized. No evidence of pulmonic stenosis.  ? ?Aorta: The aortic root is normal in size and structure.  ? ?Venous: The inferior vena cava is normal in size with greater than 50%  ?respiratory variability, suggesting right atrial pressure of 3 mmHg.  ? ?IAS/Shunts: No atrial level shunt detected by color flow Doppler.  ? ?Recent Labs: ?07/12/2021: ALT 16; Hemoglobin 9.5; Platelets 96 ?07/24/2021: BUN 38; Creatinine, Ser 1.55; Magnesium 1.4; Potassium 5.1; Sodium 139  ?Recent Lipid Panel ?   ?Component Value Date/Time  ? CHOL 148 07/13/2021 0537  ? TRIG 145 07/13/2021 0537  ? HDL 24 (L) 07/13/2021 0537  ? CHOLHDL 6.2 07/13/2021 0537  ?  VLDL 29 07/13/2021 0537  ? Bell 95 07/13/2021 0537  ? ? ?Physical Exam:   ? ?VS:  BP (!) 147/66 (BP Location: Right Arm, Patient Position: Sitting, Cuff Size: Normal)   Pulse (!) 55   Ht 5\' 10"  (1.778 m)   Wt 1

## 2021-07-24 NOTE — Patient Instructions (Signed)
Medication Instructions:  ?Your physician has recommended you make the following change in your medication ?STOP: Lisinopril and Potassium  ?START: Losartan 25mg  daily   ?*If you need a refill on your cardiac medications before your next appointment, please call your pharmacy* ? ? ?Lab Work: ?BMET and Mag  ?If you have labs (blood work) drawn today and your tests are completely normal, you will receive your results only by: ?MyChart Message (if you have MyChart) OR ?A paper copy in the mail ?If you have any lab test that is abnormal or we need to change your treatment, we will call you to review the results. ? ? ?Testing/Procedures: ? ?ZIO XT- Long Term Monitor Instructions ? ?Your physician has requested you wear a ZIO patch monitor for 14 days.  ?This is a single patch monitor. [employer] supplies one patch monitor per enrollment. Additional ?stickers are not available. Please do not apply patch if you will be having a Nuclear Stress Test,  ?Echocardiogram, Cardiac CT, MRI, or Chest Xray during the period you would be wearing the  ?monitor. The patch cannot be worn during these tests. You cannot remove and re-apply the  ?ZIO XT patch monitor.  ?Your ZIO patch monitor will be mailed 3 day USPS to your address on file. It may take 3-5 days  ?to receive your monitor after you have been enrolled.  ?Once you have received your monitor, please review the enclosed instructions. Your monitor  ?has already been registered assigning a specific monitor serial # to you. ? ?Billing and Patient Assistance Program Information ? ?We have supplied [employer] with any of your insurance information on file for billing purposes. ?[employer] offers a sliding scale Patient Assistance Program for patients that do not have  ?insurance, or whose insurance does not completely cover the cost of the ZIO monitor.  ?You must apply for the Patient Assistance Program to qualify for this discounted rate.  ?To apply, please call [employer] at [phone],  select option 4, select option 2, ask to apply for  ?Patient Assistance Program. [phone] will ask your household income, and how many people  ?are in your household. They will quote your out-of-pocket cost based on that information.  ?[employer] will also be able to set up a 53-month, interest-free payment plan if needed. ? ?Applying the monitor ?Shave hair from upper left chest.  ?Hold abrader disc by orange tab. Rub abrader in 40 strokes over the upper left chest as  ?indicated in your monitor instructions.  ?Clean area with 4 enclosed alcohol pads. Let dry.  ?Apply patch as indicated in monitor instructions. Patch will be placed under collarbone on left  ?side of chest with arrow pointing upward.  ?Rub patch adhesive wings for 2 minutes. Remove white label marked "1". Remove the white  ?label marked "2". Rub patch adhesive wings for 2 additional minutes.  ?While looking in a mirror, press and release button in center of patch. A small green light will  ?flash 3-4 times. This will be your only indicator that the monitor has been turned on.  ?Do not shower for the first 24 hours. You may shower after the first 24 hours.  ?Press the button if you feel a symptom. You will hear a small click. Record Date, Time and  ?Symptom in the Patient Logbook.  ?When you are ready to remove the patch, follow instructions on the last 2 pages of Patient  ?Logbook. Stick patch monitor onto the last page of Patient Logbook.  ?Place Patient Logbook  in the blue and white box. Use locking tab on box and tape box closed  ?securely. The blue and white box has prepaid postage on it. Please place it in the mailbox as  ?soon as possible. Your physician should have your test results approximately 7 days after the  ?monitor has been mailed back to [facility].  ?Call [facility] at [phone] if you have questions regarding  ?your ZIO XT patch monitor. Call them immediately if you see an orange light blinking on your   ?monitor.  ?If your monitor falls off in less than 4 days, contact our Monitor department at [phone].  ?If your monitor becomes loose or falls off after 4 days call [employer] at [phone] for  ?suggestions on securing your monitor  ? ? ?Follow-Up: ?At [facility], you and your health needs are our priority.  As part of our continuing mission to provide you with exceptional heart care, we have created designated Provider Care Teams.  These Care Teams include your primary Cardiologist (physician) and Advanced Practice Providers (APPs -  Physician Assistants and Nurse Practitioners) who all work together to provide you with the care you need, when you need it. ? ?We recommend signing up for the patient portal called "MyChart".  Sign up information is provided on this After Visit Summary.  MyChart is used to connect with patients for Virtual Visits (Telemedicine).  Patients are able to view lab/test results, encounter notes, upcoming appointments, etc.  Non-urgent messages can be sent to your provider as well.   ?To learn more about what you can do with MyChart, go to [email or web address].   ? ?Your next appointment:   ?12 week(s) ? ?The format for your next appointment:   ?In Person ? ?Provider:   ?Thomasene Ripple, DO  ? ? ? ? ?Important Information About Sugar ? ? ? ? ?  ?

## 2021-07-25 LAB — BASIC METABOLIC PANEL
BUN/Creatinine Ratio: 25 — ABNORMAL HIGH (ref 10–24)
BUN: 38 mg/dL — ABNORMAL HIGH (ref 8–27)
CO2: 21 mmol/L (ref 20–29)
Calcium: 9 mg/dL (ref 8.6–10.2)
Chloride: 104 mmol/L (ref 96–106)
Creatinine, Ser: 1.55 mg/dL — ABNORMAL HIGH (ref 0.76–1.27)
Glucose: 75 mg/dL (ref 70–99)
Potassium: 5.1 mmol/L (ref 3.5–5.2)
Sodium: 139 mmol/L (ref 134–144)
eGFR: 46 mL/min/{1.73_m2} — ABNORMAL LOW (ref >59)

## 2021-07-25 LAB — MAGNESIUM: Magnesium: 1.4 mg/dL — ABNORMAL LOW (ref 1.6–2.3)

## 2021-07-29 ENCOUNTER — Other Ambulatory Visit: Payer: Self-pay

## 2021-07-29 DIAGNOSIS — Z79899 Other long term (current) drug therapy: Secondary | ICD-10-CM

## 2021-07-29 MED ORDER — MAGNESIUM 400 MG PO CAPS: 400.0000 mg | ORAL_CAPSULE | Freq: Two times a day (BID) | ORAL | 0 refills | Status: DC

## 2021-07-29 NOTE — Progress Notes (Signed)
Orders for lab work placed

## 2021-07-29 NOTE — Progress Notes (Signed)
Prescription sent to pharmacy.

## 2021-07-30 ENCOUNTER — Encounter: Payer: Self-pay | Admitting: Cardiology

## 2021-07-31 DIAGNOSIS — Z8673 Personal history of transient ischemic attack (TIA), and cerebral infarction without residual deficits: Secondary | ICD-10-CM | POA: Diagnosis not present

## 2021-08-20 ENCOUNTER — Other Ambulatory Visit: Payer: Self-pay | Admitting: Cardiology

## 2021-10-24 ENCOUNTER — Telehealth: Payer: Self-pay | Admitting: Cardiology

## 2021-10-24 MED ORDER — METOPROLOL SUCCINATE ER 25 MG PO TB24: ORAL_TABLET | ORAL | 3 refills | Status: DC

## 2021-10-24 NOTE — Addendum Note (Signed)
Addended by: Neoma Laming on: 10/24/2021 11:27 AM   Modules accepted: Orders

## 2021-10-24 NOTE — Telephone Encounter (Signed)
Follow Up:      Daughter said she is returning a call from Monday from [place].

## 2021-10-24 NOTE — Telephone Encounter (Signed)
Spoke to patient's daughter she was calling to get monitor results.Advised Dr.Tobb's RN has been unable to reach patient by phone.She mailed a letter.Monitor results given.Dr.Tobb prescribed Toprol 12.5 mg daily.Advised patient is taking Nadolol.I will send message to Dr.Tobb for advice.

## 2021-10-28 NOTE — Telephone Encounter (Signed)
Do you want this pt to change from Charles Wilkinson to Toprol or continue on the Charles Wilkinson?

## 2021-10-30 ENCOUNTER — Encounter: Payer: Self-pay | Admitting: Cardiology

## 2021-10-30 ENCOUNTER — Ambulatory Visit: Payer: Medicare Other | Admitting: Cardiology

## 2021-10-30 VITALS — BP 100/58 | HR 67 | Ht 70.0 in | Wt 181.4 lb

## 2021-10-30 DIAGNOSIS — I10 Essential (primary) hypertension: Secondary | ICD-10-CM | POA: Diagnosis not present

## 2021-10-30 DIAGNOSIS — E785 Hyperlipidemia, unspecified: Secondary | ICD-10-CM

## 2021-10-30 DIAGNOSIS — E1169 Type 2 diabetes mellitus with other specified complication: Secondary | ICD-10-CM

## 2021-10-30 NOTE — Progress Notes (Signed)
Cardiology Office Note:    Date:  10/31/2021   ID:  Charles Wilkinson, DOB 09-05-1945, MRN [id number]  PCP:  Mick Sell, MD  Cardiologist:  None  Electrophysiologist:  None   Referring MD: Mick Sell, MD   " I am doing well"  History of Present Illness:    Charles Wilkinson is a 76 y.o. male with a hx of diabetes mellitus, GERD, hypoxic lipidemia, hypertension, history of CVA here today for follow-up visit.  Since I saw the patient she has been doing well from a cardiovascular standpoint.  Past Medical History:  Diagnosis Date   Diabetes mellitus without complication (HCC)    GERD (gastroesophageal reflux disease)    Hyperlipidemia    Hypertension    Hypothyroidism    NASH (nonalcoholic steatohepatitis)     Past Surgical History:  Procedure Laterality Date   APPENDECTOMY     HERNIA REPAIR      Current Medications: Current Meds  Medication Sig   acetaminophen (TYLENOL) 325 MG tablet Take 2 tablets (650 mg total) by mouth every 6 (six) hours as needed for mild pain or headache (fever >/= 101).   albuterol (VENTOLIN HFA) 108 (90 Base) MCG/ACT inhaler Inhale 2 puffs into the lungs every 6 (six) hours as needed for wheezing or shortness of breath.   ascorbic acid (VITAMIN C) 1000 MG tablet Take 1 tablet by mouth daily.   aspirin EC 81 MG EC tablet Take 1 tablet (81 mg total) by mouth daily. Swallow whole.   atorvastatin (LIPITOR) 40 MG tablet Take 1 tablet (40 mg total) by mouth daily.   Cholecalciferol 50 MCG (2000 UT) CAPS Take 2,000 Units by mouth daily.   ferrous sulfate 325 (65 FE) MG tablet Take 1 tablet by mouth daily with breakfast.   furosemide (LASIX) 40 MG tablet Take 40 mg by mouth every Monday, Wednesday, and Friday.   levothyroxine (SYNTHROID) 175 MCG tablet Take 175 mcg by mouth daily.   losartan (COZAAR) 25 MG tablet Take 1 tablet (25 mg total) by mouth daily.   magnesium oxide (MAG-OX) 400 (240 Mg) MG tablet TAKE 1 TABLET BY MOUTH EVERY MORNING AND 1  TABLET AT BEDTIME   metFORMIN (GLUCOPHAGE) 500 MG tablet Take 1,500 mg by mouth daily. 1,000mg  in the morning 500mg  at night   Multiple Vitamin (MULTI-VITAMIN) tablet Take 1 tablet by mouth daily.   nadolol (CORGARD) 20 MG tablet Take 20 mg by mouth daily.   Omega-3 Fatty Acids (OMEGA-3 FISH OIL) 1200 MG CAPS Take 1 capsule by mouth daily.   pantoprazole (PROTONIX) 40 MG tablet Take 40 mg by mouth 2 (two) times daily.   spironolactone (ALDACTONE) 50 MG tablet Take 50 mg by mouth daily.   Vitamin E 268 MG (400 UNIT) CAPS Take 1 tablet by mouth daily.   zinc sulfate 220 (50 Zn) MG capsule Take 1 capsule (220 mg total) by mouth daily.     Allergies:   Patient has no known allergies.   Social History   Socioeconomic History   Marital status: Widowed    Spouse name: Not on file   Number of children: Not on file   Years of education: Not on file   Highest education level: Not on file  Occupational History   Not on file  Tobacco Use   Smoking status: Former    Types: Cigarettes   Smokeless tobacco: Never  Vaping Use   Vaping Use: Never used  Substance and Sexual Activity   Alcohol use:  Not Currently   Drug use: Never   Sexual activity: Not on file  Other Topics Concern   Not on file  Social History Narrative   Not on file   Social Determinants of Health   Financial Resource Strain: Not on file  Food Insecurity: Not on file  Transportation Needs: Not on file  Physical Activity: Not on file  Stress: Not on file  Social Connections: Not on file     Family History: The patient's family history includes CAD in his mother; Diabetes in his mother.  ROS:   Review of Systems  Constitution: Negative for decreased appetite, fever and weight gain.  HENT: Negative for congestion, ear discharge, hoarse voice and sore throat.   Eyes: Negative for discharge, redness, vision loss in right eye and visual halos.  Cardiovascular: Negative for chest pain, dyspnea on exertion, leg  swelling, orthopnea and palpitations.  Respiratory: Negative for cough, hemoptysis, shortness of breath and snoring.   Endocrine: Negative for heat intolerance and polyphagia.  Hematologic/Lymphatic: Negative for bleeding problem. Does not bruise/bleed easily.  Skin: Negative for flushing, nail changes, rash and suspicious lesions.  Musculoskeletal: Negative for arthritis, joint pain, muscle cramps, myalgias, neck pain and stiffness.  Gastrointestinal: Negative for abdominal pain, bowel incontinence, diarrhea and excessive appetite.  Genitourinary: Negative for decreased libido, genital sores and incomplete emptying.  Neurological: Negative for brief paralysis, focal weakness, headaches and loss of balance.  Psychiatric/Behavioral: Negative for altered mental status, depression and suicidal ideas.  Allergic/Immunologic: Negative for HIV exposure and persistent infections.    EKGs/Labs/Other Studies Reviewed:    The following studies were reviewed today:   EKG:  None today  Zio monitor 08/26/2021 Patch Wear Time:  14 days and 0 hours (2023-04-26T09:22:01-399 to 2023-05-10T09:22:14-0400)   Patient had a min HR of 47 bpm, max HR of 169 bpm, and avg HR of 66 bpm. Predominant underlying rhythm was Sinus Rhythm. First Degree AV Block was present.   56 Supraventricular Tachycardia runs occurred, the run with the fastest interval lasting 7 beats with a max rate of 169 bpm, the longest lasting 33.8 secs with an avg rate of 101 bpm. Isolated SVEs were rare (<1.0%), SVE Couplets were rare (<1.0%), and SVE Triplets were rare (<1.0%). Isolated VEs were rare (<1.0%), VE Couplets were rare (<1.0%), and no VE Triplets were present. Ventricular Bigeminy was present.  No symptoms associated.  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   Recent Labs: 07/12/2021: ALT 16; Hemoglobin 9.5; Platelets 96 07/24/2021: BUN  38; Creatinine, Ser 1.55; Magnesium 1.4; Potassium 5.1; Sodium 139  Recent Lipid Panel    Component Value Date/Time   CHOL 148 07/13/2021 0537   TRIG 145 07/13/2021 0537   HDL 24 (L) 07/13/2021 0537   CHOLHDL 6.2 07/13/2021 0537   VLDL 29 07/13/2021 0537   LDLCALC 95 07/13/2021 0537    Physical Exam:    VS:  BP (!) 100/58   Pulse 67   Ht 5\' 10"  (1.778 m)   Wt 181 lb 6.4 oz (82.3 kg)   SpO2 97%   BMI 26.03 kg/m     Wt Readings from Last 3 Encounters:  10/30/21 181 lb 6.4 oz (82.3 kg)  07/24/21 182 lb 9.6 oz (82.8 kg)  07/12/21 180 lb (81.6 kg)     GEN: Well nourished, well developed in no acute distress HEENT: Normal NECK: No JVD; No carotid bruits LYMPHATICS: No lymphadenopathy CARDIAC: S1S2 noted,RRR, no  murmurs, rubs, gallops RESPIRATORY:  Clear to auscultation without rales, wheezing or rhonchi  ABDOMEN: Soft, non-tender, non-distended, +bowel sounds, no guarding. EXTREMITIES: No edema, No cyanosis, no clubbing MUSCULOSKELETAL:  No deformity  SKIN: Warm and dry NEUROLOGIC:  Alert and oriented x 3, non-focal PSYCHIATRIC:  Normal affect, good insight  ASSESSMENT:    1. Essential hypertension   2. Type 2 diabetes mellitus with hyperlipidemia (HCC)    PLAN:     1.  She is doing well from a cardiovascular standpoint.  The patient is in agreement with the above plan. The patient left the office in stable condition.  The patient will follow up in   Medication Adjustments/Labs and Tests Ordered: Current medicines are reviewed at length with the patient today.  Concerns regarding medicines are outlined above.  No orders of the defined types were placed in this encounter.  No orders of the defined types were placed in this encounter.   Patient Instructions  Medication Instructions:  Your physician recommends that you continue on your current medications as directed. Please refer to the Current Medication list given to you today. *If you need a refill on your  cardiac medications before your next appointment, please call your pharmacy*   Lab Work: NONE If you have labs (blood work) drawn today and your tests are completely normal, you will receive your results only by: MyChart Message (if you have MyChart) OR A paper copy in the mail If you have any lab test that is abnormal or we need to change your treatment, we will call you to review the results.   Testing/Procedures: NONE   Follow-Up: At [facility], you and your health needs are our priority.  As part of our continuing mission to provide you with exceptional heart care, we have created designated Provider Care Teams.  These Care Teams include your primary Cardiologist (physician) and Advanced Practice Providers (APPs -  Physician Assistants and Nurse Practitioners) who all work together to provide you with the care you need, when you need it.  We recommend signing up for the patient portal called "MyChart".  Sign up information is provided on this After Visit Summary.  MyChart is used to connect with patients for Virtual Visits (Telemedicine).  Patients are able to view lab/test results, encounter notes, upcoming appointments, etc.  Non-urgent messages can be sent to your provider as well.   To learn more about what you can do with MyChart, go to [email or web address].    Your next appointment:   6 month(s)  The format for your next appointment:   In Person  Provider:   Thomasene Ripple, DO     Adopting a Healthy Lifestyle.  Know what a healthy weight is for you (roughly BMI <25) and aim to maintain this   Aim for 7+ servings of fruits and vegetables daily   65-80+ fluid ounces of water or unsweet tea for healthy kidneys   Limit to max 1 drink of alcohol per day; avoid smoking/tobacco   Limit animal fats in diet for cholesterol and heart health - choose grass fed whenever available   Avoid highly processed foods, and foods high in saturated/trans fats   Aim for low stress  - take time to unwind and care for your mental health   Aim for 150 min of moderate intensity exercise weekly for heart health, and weights twice weekly for bone health   Aim for 7-9 hours of sleep daily   When it comes to diets, agreement about the  perfect plan isnt easy to find, even among the experts. Experts at the [employer] developed an idea known as the Healthy Eating Plate. Just imagine a plate divided into logical, healthy portions.   The emphasis is on diet quality:   Load up on vegetables and fruits - one-half of your plate: Aim for color and variety, and remember that potatoes dont count.   Go for whole grains - one-quarter of your plate: Whole wheat, barley, wheat berries, quinoa, oats, brown rice, and foods made with them. If you want pasta, go with whole wheat pasta.   Protein power - one-quarter of your plate: Fish, chicken, beans, and nuts are all healthy, versatile protein sources. Limit red meat.   The diet, however, does go beyond the plate, offering a few other suggestions.   Use healthy plant oils, such as olive, canola, soy, corn, sunflower and peanut. Check the labels, and avoid partially hydrogenated oil, which have unhealthy trans fats.   If youre thirsty, drink water. Coffee and tea are good in moderation, but skip sugary drinks and limit milk and dairy products to one or two daily servings.   The type of carbohydrate in the diet is more important than the amount. Some sources of carbohydrates, such as vegetables, fruits, whole grains, and beans-are healthier than others.   Finally, stay active  Signed, Berniece Salines, DO  10/31/2021 8:51 PM    [facility]

## 2021-10-30 NOTE — Patient Instructions (Addendum)
Medication Instructions:  Your physician recommends that you continue on your current medications as directed. Please refer to the Current Medication list given to you today.  *If you need a refill on your cardiac medications before your next appointment, please call your pharmacy*   Lab Work: NONE If you have labs (blood work) drawn today and your tests are completely normal, you will receive your results only by: MyChart Message (if you have MyChart) OR A paper copy in the mail If you have any lab test that is abnormal or we need to change your treatment, we will call you to review the results.   Testing/Procedures: NONE   Follow-Up: At [facility], you and your health needs are our priority.  As part of our continuing mission to provide you with exceptional heart care, we have created designated Provider Care Teams.  These Care Teams include your primary Cardiologist (physician) and Advanced Practice Providers (APPs -  Physician Assistants and Nurse Practitioners) who all work together to provide you with the care you need, when you need it.  We recommend signing up for the patient portal called "MyChart".  Sign up information is provided on this After Visit Summary.  MyChart is used to connect with patients for Virtual Visits (Telemedicine).  Patients are able to view lab/test results, encounter notes, upcoming appointments, etc.  Non-urgent messages can be sent to your provider as well.   To learn more about what you can do with MyChart, go to [email or web address].    Your next appointment:   6 month(s)  The format for your next appointment:   In Person  Provider:   Kardie Tobb, DO   

## 2021-11-05 NOTE — Telephone Encounter (Signed)
Called pt's daughter. Left message letting her know Dr. Servando Salina does not want to change her father's medication.

## 2022-02-07 ENCOUNTER — Other Ambulatory Visit: Payer: Self-pay

## 2022-02-07 DIAGNOSIS — E039 Hypothyroidism, unspecified: Secondary | ICD-10-CM | POA: Insufficient documentation

## 2022-02-07 DIAGNOSIS — Z7984 Long term (current) use of oral hypoglycemic drugs: Secondary | ICD-10-CM | POA: Diagnosis not present

## 2022-02-07 DIAGNOSIS — N1831 Chronic kidney disease, stage 3a: Secondary | ICD-10-CM | POA: Diagnosis not present

## 2022-02-07 DIAGNOSIS — I129 Hypertensive chronic kidney disease with stage 1 through stage 4 chronic kidney disease, or unspecified chronic kidney disease: Secondary | ICD-10-CM | POA: Insufficient documentation

## 2022-02-07 DIAGNOSIS — Z7982 Long term (current) use of aspirin: Secondary | ICD-10-CM | POA: Insufficient documentation

## 2022-02-07 DIAGNOSIS — Z79899 Other long term (current) drug therapy: Secondary | ICD-10-CM | POA: Insufficient documentation

## 2022-02-07 DIAGNOSIS — E1122 Type 2 diabetes mellitus with diabetic chronic kidney disease: Secondary | ICD-10-CM | POA: Insufficient documentation

## 2022-02-07 DIAGNOSIS — K068 Other specified disorders of gingiva and edentulous alveolar ridge: Secondary | ICD-10-CM | POA: Diagnosis present

## 2022-02-07 NOTE — ED Notes (Signed)
First Nurse Note  Per EMS pt reports bleeding gums onset ~30 min pta. Upon EMS arrival no active bleeding noted but blood noted around pt mouth. Pt not on daily thinners. Pt denies fall or injury with EMS. Pt alert and oriented and denies pain anywhere.   167/80 97% RA HR 79 RR 18 CBG 302

## 2022-02-07 NOTE — ED Triage Notes (Signed)
Pt BIB EMS for oral bleeding that started around 10:45pm. Pt denies injury or blood thinners. Pt unsure where the bleeding is coming from. Bleeding seems to have stopped at this time.

## 2022-02-08 ENCOUNTER — Emergency Department
Admission: EM | Admit: 2022-02-08 | Discharge: 2022-02-08 | Disposition: A | Discharge status: Home / Self Care | Payer: Medicare Other | Attending: Emergency Medicine | Admitting: Emergency Medicine

## 2022-02-08 DIAGNOSIS — K068 Other specified disorders of gingiva and edentulous alveolar ridge: Secondary | ICD-10-CM

## 2022-02-08 LAB — COMPREHENSIVE METABOLIC PANEL
ALT: 18 U/L (ref 0–44)
AST: 25 U/L (ref 15–41)
Albumin: 3.1 g/dL — ABNORMAL LOW (ref 3.5–5.0)
Alkaline Phosphatase: 133 U/L — ABNORMAL HIGH (ref 38–126)
Anion gap: 7 (ref 5–15)
BUN: 35 mg/dL — ABNORMAL HIGH (ref 8–23)
CO2: 23 mmol/L (ref 22–32)
Calcium: 8.7 mg/dL — ABNORMAL LOW (ref 8.9–10.3)
Chloride: 107 mmol/L (ref 98–111)
Creatinine, Ser: 1.69 mg/dL — ABNORMAL HIGH (ref 0.61–1.24)
GFR, Estimated: 41 mL/min — ABNORMAL LOW (ref >60)
Glucose, Bld: 187 mg/dL — ABNORMAL HIGH (ref 70–99)
Potassium: 3.9 mmol/L (ref 3.5–5.1)
Sodium: 137 mmol/L (ref 135–145)
Total Bilirubin: 0.7 mg/dL (ref 0.3–1.2)
Total Protein: 7 g/dL (ref 6.5–8.1)

## 2022-02-08 LAB — CBC WITH DIFFERENTIAL/PLATELET
Abs Immature Granulocytes: 0.01 10*3/uL (ref 0.00–0.07)
Basophils Absolute: 0.1 10*3/uL (ref 0.0–0.1)
Basophils Relative: 1 %
Eosinophils Absolute: 0.7 10*3/uL — ABNORMAL HIGH (ref 0.0–0.5)
Eosinophils Relative: 12 %
HCT: 32.5 % — ABNORMAL LOW (ref 39.0–52.0)
Hemoglobin: 10.6 g/dL — ABNORMAL LOW (ref 13.0–17.0)
Immature Granulocytes: 0 %
Lymphocytes Relative: 17 %
Lymphs Abs: 1 10*3/uL (ref 0.7–4.0)
MCH: 29.6 pg (ref 26.0–34.0)
MCHC: 32.6 g/dL (ref 30.0–36.0)
MCV: 90.8 fL (ref 80.0–100.0)
Monocytes Absolute: 0.5 10*3/uL (ref 0.1–1.0)
Monocytes Relative: 8 %
Neutro Abs: 3.3 10*3/uL (ref 1.7–7.7)
Neutrophils Relative %: 62 %
Platelets: 131 10*3/uL — ABNORMAL LOW (ref 150–400)
RBC: 3.58 MIL/uL — ABNORMAL LOW (ref 4.22–5.81)
RDW: 13.8 % (ref 11.5–15.5)
WBC: 5.5 10*3/uL (ref 4.0–10.5)
nRBC: 0 % (ref 0.0–0.2)

## 2022-02-08 NOTE — ED Provider Notes (Signed)
[facility] Provider Note    Event Date/Time   First MD Initiated Contact with Patient 02/08/22 [id number]     (approximate)   History   Oral Bleeding    HPI  Charles Wilkinson is a 76 y.o. male brought to the ED via EMS from home with chief complaint of oral bleeding which began around 10:45 PM while watching TV.  Ate hamburger and chips for dinner.  Patient denies fall/trauma/injury.  Denies anticoagulant use.  Patient unsure where the bleeding came from but it was resolved upon patient's arrival to the emergency department.  Denies headache, chest pain, shortness of breath, abdominal pain, nausea, vomiting or dizziness.     Past Medical History   Past Medical History:  Diagnosis Date   Diabetes mellitus without complication (HCC)    GERD (gastroesophageal reflux disease)    Hyperlipidemia    Hypertension    Hypothyroidism    NASH (nonalcoholic steatohepatitis)      Active Problem List   Patient Active Problem List   Diagnosis Date Noted   Stroke (HCC) 07/12/2021   Pancytopenia (HCC) 07/12/2021   Type II diabetes mellitus with renal manifestations (HCC) 07/12/2021   Acute renal failure superimposed on stage 3a chronic kidney disease (HCC) 07/12/2021   Weakness    Thrombocytopenia (HCC)    Acute pain of left knee    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 04/01/2021   Hyperlipidemia 04/01/2021   Traumatic rhabdomyolysis (HCC)    Fall    Stage 3a chronic kidney disease (HCC)    Type 2 diabetes mellitus with hyperlipidemia (HCC)    Essential hypertension    NASH (nonalcoholic steatohepatitis)    Hypothyroidism      Past Surgical History   Past Surgical History:  Procedure Laterality Date   APPENDECTOMY     HERNIA REPAIR       Home Medications   Prior to Admission medications   Medication Sig Start Date End Date Taking? Authorizing Provider  acetaminophen (TYLENOL) 325 MG tablet Take 2 tablets (650 mg total) by mouth every 6 (six) hours as  needed for mild pain or headache (fever >/= 101). 04/10/21   Alford Highland, MD  albuterol (VENTOLIN HFA) 108 (90 Base) MCG/ACT inhaler Inhale 2 puffs into the lungs every 6 (six) hours as needed for wheezing or shortness of breath. 04/10/21   Alford Highland, MD  ascorbic acid (VITAMIN C) 1000 MG tablet Take 1 tablet by mouth daily.    [provider]  aspirin EC 81 MG EC tablet Take 1 tablet (81 mg total) by mouth daily. Swallow whole. 07/13/21   Arnetha Courser, MD  atorvastatin (LIPITOR) 40 MG tablet Take 1 tablet (40 mg total) by mouth daily. 07/13/21   Arnetha Courser, MD  Cholecalciferol 50 MCG (2000 UT) CAPS Take 2,000 Units by mouth daily.    [provider]  ferrous sulfate 325 (65 FE) MG tablet Take 1 tablet by mouth daily with breakfast. 10/22/21 10/22/22  [provider]  furosemide (LASIX) 40 MG tablet Take 40 mg by mouth every Monday, Wednesday, and Friday.    [provider]  levothyroxine (SYNTHROID) 175 MCG tablet Take 175 mcg by mouth daily. 02/01/21   [provider]  losartan (COZAAR) 25 MG tablet Take 1 tablet (25 mg total) by mouth daily. 07/24/21   Tobb, Kardie, DO  magnesium oxide (MAG-OX) 400 (240 Mg) MG tablet TAKE 1 TABLET BY MOUTH EVERY MORNING AND 1 TABLET AT BEDTIME 08/20/21   Tobb,  Lavona Mound, DO  metFORMIN (GLUCOPHAGE) 500 MG tablet Take 1,500 mg by mouth daily. 1,000mg  in the morning 500mg  at night 03/18/21   [provider]  Multiple Vitamin (MULTI-VITAMIN) tablet Take 1 tablet by mouth daily.    [provider]  nadolol (CORGARD) 20 MG tablet Take 20 mg by mouth daily.    [provider]  Omega-3 Fatty Acids (OMEGA-3 FISH OIL) 1200 MG CAPS Take 1 capsule by mouth daily.    [provider]  pantoprazole (PROTONIX) 40 MG tablet Take 40 mg by mouth 2 (two) times daily. 01/02/21   [provider]  spironolactone (ALDACTONE) 50 MG tablet Take 50 mg by mouth daily.    [provider]   Vitamin E 268 MG (400 UNIT) CAPS Take 1 tablet by mouth daily.    [provider]  zinc sulfate 220 (50 Zn) MG capsule Take 1 capsule (220 mg total) by mouth daily. 04/11/21   06/09/21, MD     Allergies  Patient has no known allergies.   Family History   Family History  Problem Relation Age of Onset   CAD Mother    Diabetes Mother      Physical Exam  Triage Vital Signs: ED Triage Vitals  Enc Vitals Group     BP 02/07/22 2353 (!) 124/54     Pulse Rate 02/07/22 2353 74     Resp 02/07/22 2353 18     Temp 02/07/22 2353 97.9 F (36.6 C)     Temp Source 02/07/22 2353 Axillary     SpO2 02/07/22 2353 96 %     Weight 02/07/22 2348 181 lb (82.1 kg)     Height 02/07/22 2348 5\' 10"  (1.778 m)     Head Circumference --      Peak Flow --      Pain Score 02/07/22 2348 0     Pain Loc --      Pain Edu? --      Excl. in GC? --     Updated Vital Signs: BP 137/66   Pulse 76   Temp 97.9 F (36.6 C) (Axillary)   Resp 18   Ht 5\' 10"  (1.778 m)   Wt 82.1 kg   SpO2 97%   BMI 25.97 kg/m    General: Awake, no distress.  CV:  Good peripheral perfusion.  Resp:  Normal effort.  Abd:  No distention.  Other:  Entire mouth and oropharynx examed: Partial upper dentures noted.  Clot to upper gum.  No active bleeding.   ED Results / Procedures / Treatments  Labs (all labs ordered are listed, but only abnormal results are displayed) Labs Reviewed  CBC WITH DIFFERENTIAL/PLATELET - Abnormal; Notable for the following components:      Result Value   RBC 3.58 (*)    Hemoglobin 10.6 (*)    HCT 32.5 (*)    Platelets 131 (*)    Eosinophils Absolute 0.7 (*)    All other components within normal limits  COMPREHENSIVE METABOLIC PANEL - Abnormal; Notable for the following components:   Glucose, Bld 187 (*)    BUN 35 (*)    Creatinine, Ser 1.69 (*)    Calcium 8.7 (*)    Albumin 3.1 (*)    Alkaline Phosphatase 133 (*)    GFR, Estimated 41 (*)    All other components within  normal limits     EKG  None   RADIOLOGY None   Official radiology report(s): No results found.  PROCEDURES:  Critical Care performed: No  Procedures   MEDICATIONS ORDERED IN ED: Medications - No data to display   IMPRESSION / MDM / [place] / ED COURSE  I reviewed the triage vital signs and the nursing notes.                             76 year old male presenting with oral bleeding.  No active bleeding now.  Source seems to be from upper gums.  Patient does not usually take out his upper partial dentures.  I recommended gentle rinse tonight with water and soft foods diet.  Return precautions given.  Patient verbalizes understanding and agrees with plan of care.  Patient's presentation is most consistent with acute, uncomplicated illness.   FINAL CLINICAL IMPRESSION(S) / ED DIAGNOSES   Final diagnoses:  Bleeding gums     Rx / DC Orders   ED Discharge Orders     None        Note:  This document was prepared using Dragon voice recognition software and may include unintentional dictation errors.   Paulette Blanch, MD 02/08/22 [phone]

## 2022-02-08 NOTE — Discharge Instructions (Signed)
Eat soft foods; avoid crunchy foods or foods or bones.  Return to the ER for recurrent or worsening symptoms or other concerns.

## 2022-04-17 DIAGNOSIS — H524 Presbyopia: Secondary | ICD-10-CM | POA: Diagnosis not present

## 2022-04-17 DIAGNOSIS — E113393 Type 2 diabetes mellitus with moderate nonproliferative diabetic retinopathy without macular edema, bilateral: Secondary | ICD-10-CM | POA: Diagnosis not present

## 2022-04-22 DIAGNOSIS — I959 Hypotension, unspecified: Secondary | ICD-10-CM | POA: Diagnosis not present

## 2022-04-22 DIAGNOSIS — R001 Bradycardia, unspecified: Secondary | ICD-10-CM | POA: Diagnosis not present

## 2022-04-22 DIAGNOSIS — I639 Cerebral infarction, unspecified: Secondary | ICD-10-CM | POA: Diagnosis not present

## 2022-04-22 DIAGNOSIS — G20A1 Parkinson's disease without dyskinesia, without mention of fluctuations: Secondary | ICD-10-CM | POA: Diagnosis not present

## 2022-05-05 DIAGNOSIS — D649 Anemia, unspecified: Secondary | ICD-10-CM | POA: Diagnosis not present

## 2022-05-05 DIAGNOSIS — I8500 Esophageal varices without bleeding: Secondary | ICD-10-CM | POA: Diagnosis not present

## 2022-05-05 DIAGNOSIS — I10 Essential (primary) hypertension: Secondary | ICD-10-CM | POA: Diagnosis not present

## 2022-05-05 DIAGNOSIS — E119 Type 2 diabetes mellitus without complications: Secondary | ICD-10-CM | POA: Diagnosis not present

## 2022-05-05 DIAGNOSIS — E785 Hyperlipidemia, unspecified: Secondary | ICD-10-CM | POA: Diagnosis not present

## 2022-05-05 DIAGNOSIS — N179 Acute kidney failure, unspecified: Secondary | ICD-10-CM | POA: Diagnosis not present

## 2022-05-05 DIAGNOSIS — E039 Hypothyroidism, unspecified: Secondary | ICD-10-CM | POA: Diagnosis not present

## 2022-05-05 DIAGNOSIS — D696 Thrombocytopenia, unspecified: Secondary | ICD-10-CM | POA: Diagnosis not present

## 2022-05-05 DIAGNOSIS — K7581 Nonalcoholic steatohepatitis (NASH): Secondary | ICD-10-CM | POA: Diagnosis not present

## 2022-05-13 DIAGNOSIS — E1165 Type 2 diabetes mellitus with hyperglycemia: Secondary | ICD-10-CM | POA: Diagnosis not present

## 2022-05-13 DIAGNOSIS — N1832 Chronic kidney disease, stage 3b: Secondary | ICD-10-CM | POA: Diagnosis not present

## 2022-05-13 DIAGNOSIS — E1159 Type 2 diabetes mellitus with other circulatory complications: Secondary | ICD-10-CM | POA: Diagnosis not present

## 2022-05-13 DIAGNOSIS — E11649 Type 2 diabetes mellitus with hypoglycemia without coma: Secondary | ICD-10-CM | POA: Diagnosis not present

## 2022-05-13 DIAGNOSIS — E1122 Type 2 diabetes mellitus with diabetic chronic kidney disease: Secondary | ICD-10-CM | POA: Diagnosis not present

## 2022-05-13 DIAGNOSIS — I152 Hypertension secondary to endocrine disorders: Secondary | ICD-10-CM | POA: Diagnosis not present

## 2022-06-05 DIAGNOSIS — E039 Hypothyroidism, unspecified: Secondary | ICD-10-CM | POA: Diagnosis not present

## 2022-06-05 DIAGNOSIS — I10 Essential (primary) hypertension: Secondary | ICD-10-CM | POA: Diagnosis not present

## 2022-06-05 DIAGNOSIS — N179 Acute kidney failure, unspecified: Secondary | ICD-10-CM | POA: Diagnosis not present

## 2022-06-05 DIAGNOSIS — Z0000 Encounter for general adult medical examination without abnormal findings: Secondary | ICD-10-CM | POA: Diagnosis not present

## 2022-06-05 DIAGNOSIS — K7581 Nonalcoholic steatohepatitis (NASH): Secondary | ICD-10-CM | POA: Diagnosis not present

## 2022-06-05 DIAGNOSIS — E785 Hyperlipidemia, unspecified: Secondary | ICD-10-CM | POA: Diagnosis not present

## 2022-06-05 DIAGNOSIS — E119 Type 2 diabetes mellitus without complications: Secondary | ICD-10-CM | POA: Diagnosis not present

## 2022-06-05 DIAGNOSIS — D649 Anemia, unspecified: Secondary | ICD-10-CM | POA: Diagnosis not present

## 2022-06-05 DIAGNOSIS — G720 Drug-induced myopathy: Secondary | ICD-10-CM | POA: Diagnosis not present

## 2022-06-12 DIAGNOSIS — M79674 Pain in right toe(s): Secondary | ICD-10-CM | POA: Diagnosis not present

## 2022-06-12 DIAGNOSIS — M79675 Pain in left toe(s): Secondary | ICD-10-CM | POA: Diagnosis not present

## 2022-06-12 DIAGNOSIS — B351 Tinea unguium: Secondary | ICD-10-CM | POA: Diagnosis not present

## 2022-06-25 DIAGNOSIS — N1832 Chronic kidney disease, stage 3b: Secondary | ICD-10-CM | POA: Diagnosis not present

## 2022-06-25 DIAGNOSIS — E11649 Type 2 diabetes mellitus with hypoglycemia without coma: Secondary | ICD-10-CM | POA: Diagnosis not present

## 2022-06-25 DIAGNOSIS — E1122 Type 2 diabetes mellitus with diabetic chronic kidney disease: Secondary | ICD-10-CM | POA: Diagnosis not present

## 2022-06-25 DIAGNOSIS — E1165 Type 2 diabetes mellitus with hyperglycemia: Secondary | ICD-10-CM | POA: Diagnosis not present

## 2022-06-25 DIAGNOSIS — E1159 Type 2 diabetes mellitus with other circulatory complications: Secondary | ICD-10-CM | POA: Diagnosis not present

## 2022-06-25 DIAGNOSIS — I152 Hypertension secondary to endocrine disorders: Secondary | ICD-10-CM | POA: Diagnosis not present

## 2022-07-10 ENCOUNTER — Other Ambulatory Visit: Payer: Self-pay | Admitting: Cardiology

## 2022-08-05 DIAGNOSIS — E785 Hyperlipidemia, unspecified: Secondary | ICD-10-CM | POA: Diagnosis not present

## 2022-08-05 DIAGNOSIS — E039 Hypothyroidism, unspecified: Secondary | ICD-10-CM | POA: Diagnosis not present

## 2022-08-05 DIAGNOSIS — Z794 Long term (current) use of insulin: Secondary | ICD-10-CM | POA: Diagnosis not present

## 2022-08-05 DIAGNOSIS — I10 Essential (primary) hypertension: Secondary | ICD-10-CM | POA: Diagnosis not present

## 2022-08-05 DIAGNOSIS — D649 Anemia, unspecified: Secondary | ICD-10-CM | POA: Diagnosis not present

## 2022-08-05 DIAGNOSIS — G720 Drug-induced myopathy: Secondary | ICD-10-CM | POA: Diagnosis not present

## 2022-08-05 DIAGNOSIS — K7581 Nonalcoholic steatohepatitis (NASH): Secondary | ICD-10-CM | POA: Diagnosis not present

## 2022-08-05 DIAGNOSIS — E119 Type 2 diabetes mellitus without complications: Secondary | ICD-10-CM | POA: Diagnosis not present

## 2022-08-05 DIAGNOSIS — N179 Acute kidney failure, unspecified: Secondary | ICD-10-CM | POA: Diagnosis not present

## 2022-08-11 DIAGNOSIS — E785 Hyperlipidemia, unspecified: Secondary | ICD-10-CM | POA: Diagnosis not present

## 2022-08-11 DIAGNOSIS — Z794 Long term (current) use of insulin: Secondary | ICD-10-CM | POA: Diagnosis not present

## 2022-08-11 DIAGNOSIS — E119 Type 2 diabetes mellitus without complications: Secondary | ICD-10-CM | POA: Diagnosis not present

## 2022-08-11 DIAGNOSIS — E11649 Type 2 diabetes mellitus with hypoglycemia without coma: Secondary | ICD-10-CM | POA: Diagnosis not present

## 2022-08-11 DIAGNOSIS — I10 Essential (primary) hypertension: Secondary | ICD-10-CM | POA: Diagnosis not present

## 2022-09-02 DIAGNOSIS — H40153 Residual stage of open-angle glaucoma, bilateral: Secondary | ICD-10-CM | POA: Diagnosis not present

## 2022-09-18 ENCOUNTER — Encounter: Payer: Self-pay | Admitting: Emergency Medicine

## 2022-09-18 ENCOUNTER — Emergency Department
Admission: EM | Admit: 2022-09-18 | Discharge: 2022-09-18 | Disposition: A | Discharge status: Home / Self Care | Payer: Medicare HMO | Attending: Emergency Medicine | Admitting: Emergency Medicine

## 2022-09-18 ENCOUNTER — Emergency Department: Payer: Medicare HMO

## 2022-09-18 ENCOUNTER — Other Ambulatory Visit: Payer: Self-pay

## 2022-09-18 DIAGNOSIS — Y9301 Activity, walking, marching and hiking: Secondary | ICD-10-CM | POA: Diagnosis not present

## 2022-09-18 DIAGNOSIS — W01198A Fall on same level from slipping, tripping and stumbling with subsequent striking against other object, initial encounter: Secondary | ICD-10-CM | POA: Diagnosis not present

## 2022-09-18 DIAGNOSIS — S0990XA Unspecified injury of head, initial encounter: Secondary | ICD-10-CM | POA: Diagnosis not present

## 2022-09-18 DIAGNOSIS — I129 Hypertensive chronic kidney disease with stage 1 through stage 4 chronic kidney disease, or unspecified chronic kidney disease: Secondary | ICD-10-CM | POA: Insufficient documentation

## 2022-09-18 DIAGNOSIS — S50311A Abrasion of right elbow, initial encounter: Secondary | ICD-10-CM | POA: Insufficient documentation

## 2022-09-18 DIAGNOSIS — E1122 Type 2 diabetes mellitus with diabetic chronic kidney disease: Secondary | ICD-10-CM | POA: Insufficient documentation

## 2022-09-18 DIAGNOSIS — S0101XA Laceration without foreign body of scalp, initial encounter: Secondary | ICD-10-CM | POA: Diagnosis not present

## 2022-09-18 DIAGNOSIS — M4312 Spondylolisthesis, cervical region: Secondary | ICD-10-CM | POA: Diagnosis not present

## 2022-09-18 DIAGNOSIS — M25521 Pain in right elbow: Secondary | ICD-10-CM | POA: Diagnosis not present

## 2022-09-18 DIAGNOSIS — S90511A Abrasion, right ankle, initial encounter: Secondary | ICD-10-CM | POA: Diagnosis not present

## 2022-09-18 DIAGNOSIS — N183 Chronic kidney disease, stage 3 unspecified: Secondary | ICD-10-CM | POA: Insufficient documentation

## 2022-09-18 DIAGNOSIS — M25571 Pain in right ankle and joints of right foot: Secondary | ICD-10-CM | POA: Diagnosis not present

## 2022-09-18 DIAGNOSIS — W19XXXA Unspecified fall, initial encounter: Secondary | ICD-10-CM

## 2022-09-18 DIAGNOSIS — M47812 Spondylosis without myelopathy or radiculopathy, cervical region: Secondary | ICD-10-CM | POA: Diagnosis not present

## 2022-09-18 MED ORDER — LIDOCAINE-EPINEPHRINE 1 %-1:100000 IJ SOLN
20.0000 mL | Freq: Once | INTRAMUSCULAR | Status: AC
  Administered 2022-09-18: 20 mL via INTRADERMAL
  Filled 2022-09-18: qty 1

## 2022-09-18 MED ORDER — TRAMADOL HCL 50 MG PO TABS: 50.0000 mg | ORAL_TABLET | Freq: Two times a day (BID) | ORAL | 0 refills | Status: AC

## 2022-09-18 MED ORDER — TRAMADOL HCL 50 MG PO TABS
50.0000 mg | ORAL_TABLET | Freq: Once | ORAL | Status: AC
  Administered 2022-09-18: 50 mg via ORAL
  Filled 2022-09-18: qty 1

## 2022-09-18 NOTE — ED Triage Notes (Signed)
Pt here via [facility] after a fall. Pt was walking and tripped and hit the back of his head. Pt denies LOC and denies being on blood thinners. Pt has a laceration to the back of his head that was bleeding a lot but has since stopped. Pt denies any c/o pain.   134/58 62 20 97% RA

## 2022-09-18 NOTE — Discharge Instructions (Signed)
Your exam, XRs, and CT scans are normal and reassuring. No evidence of any acute injury related to the fall. Follow-up with the primary provider for ongoing care.

## 2022-09-18 NOTE — ED Provider Notes (Signed)
[facility] Emergency Department Provider Note     Event Date/Time   First MD Initiated Contact with Patient 09/18/22 1340     (approximate)   History   Fall   HPI  Charles Wilkinson is a 77 y.o. male with a history of encephalopathy, HLD, stage III CKD, DM, HTN, and CVA, presents to the ED accompanied by his home health nurse.  Patient presents to the ED via EMS following mechanical fall while he was walking, his feet got caught up and he tripped hitting the back of his head.  Patient not on reports of LOC or blood thinner use.  He presents with a large abrasion to the posterior scalp.  Patient also endorses an abrasion to the right elbow and the lateral right ankle.  No reports of any significant pain at this time.  Physical Exam   Triage Vital Signs: ED Triage Vitals [09/18/22 1323]  Enc Vitals Group     BP 131/63     Pulse Rate (!) 55     Resp 18     Temp 98.2 F (36.8 C)     Temp Source Oral     SpO2 95 %     Weight 181 lb (82.1 kg)     Height 5\' 10"  (1.778 m)     Head Circumference      Peak Flow      Pain Score 2     Pain Loc      Pain Edu?      Excl. in GC?     Most recent vital signs: Vitals:   09/18/22 1323 09/18/22 1630  BP: 131/63 130/60  Pulse: (!) 55 (!) 55  Resp: 18 18  Temp: 98.2 F (36.8 C) 98 F (36.7 C)  SpO2: 95% 96%    General Awake, no distress. NAD HEENT NCAT, except for large 3 cm abrasion to the occiput of the scalp.  No active bleeding at this time. PERRL. EOMI. No rhinorrhea. Mucous membranes are moist.  CV:  Good peripheral perfusion.  RESP:  Normal effort.  ABD:  No distention.  MSK:  Full active range of motion to the right elbow.  Superficial abrasion noted.  No joint effusion appreciated.  Right ankle with lateral abrasion without evidence of deformity or dislocation. NEURO: Cranial nerves II to XII grossly intact.  ED Results / Procedures / Treatments   Labs (all labs ordered are listed, but only  abnormal results are displayed) Labs Reviewed - No data to display   EKG   RADIOLOGY  I personally viewed and evaluated these images as part of my medical decision making, as well as reviewing the written report by the radiologist.  ED Provider Interpretation: No acute findings  DG ELBOW COMPLETE RIGHT (3+VIEW)  Result Date: 09/18/2022 CLINICAL DATA:  Pain after fall EXAM: RIGHT ELBOW - COMPLETE 4 VIEW COMPARISON:  None Available. FINDINGS: No fracture or dislocation. Preserved joint spaces and bone mineralization. There are some well corticated density seen along the lateral epicondyle margin, possibly sequela of remote trauma or accessory ossicle. No definite joint effusion on lateral view. IMPRESSION: No acute osseous abnormality.  Chronic changes. Electronically Signed   By: Karen Kays M.D.   On: 09/18/2022 15:49   DG Ankle Complete Right  Result Date: 09/18/2022 CLINICAL DATA:  Fall with right ankle pain EXAM: RIGHT ANKLE - COMPLETE 3 VIEW COMPARISON:  None Available. FINDINGS: There are no findings of fracture or dislocation. No joint effusion. There  is no evidence of arthropathy or other focal bone abnormality. Ankle mortise is intact. Soft tissues are unremarkable. IMPRESSION: No acute fracture or dislocation. Electronically Signed   By: Agustin Cree M.D.   On: 09/18/2022 15:48   CT HEAD WO CONTRAST ( )  Result Date: 09/18/2022 CLINICAL DATA:  Head trauma, minor (Age >= 65y); Neck trauma (Age >= 65y). Fall with head strike. EXAM: CT HEAD WITHOUT CONTRAST CT CERVICAL SPINE WITHOUT CONTRAST TECHNIQUE: Multidetector CT imaging of the head and cervical spine was performed following the standard protocol without intravenous contrast. Multiplanar CT image reconstructions of the cervical spine were also generated. RADIATION DOSE REDUCTION: This exam was performed according to the departmental dose-optimization program which includes automated exposure control, adjustment of the mA and/or kV  according to patient size and/or use of iterative reconstruction technique. COMPARISON:  Head CT 07/12/2021. FINDINGS: CT HEAD FINDINGS Brain: No acute hemorrhage. Interval development of hypoattenuation along the anterior aspect of the right fusiform gyrus, favored to reflect subacute to chronic infarct. Stable background of mild chronic small-vessel disease. Hydrocephalus or extra-axial collection. No mass effect or midline shift. Vascular: No hyperdense vessel or unexpected calcification. Skull: No calvarial fracture or suspicious bone lesion. Skull base is unremarkable. Sinuses/Orbits: Unremarkable. Other: Right parietal scalp laceration. CT CERVICAL SPINE FINDINGS Alignment: 3 mm degenerative anterolisthesis of C2 on C3. No traumatic malalignment. Skull base and vertebrae: No acute fracture. Normal craniocervical junction. No suspicious bone lesions. Soft tissues and spinal canal: No prevertebral fluid or swelling. No visible canal hematoma. Disc levels: Multilevel cervical spondylosis, worst at C5-6, where there is at least mild spinal canal stenosis. Upper chest: Unremarkable. Other: Atherosclerotic calcifications of the carotid bulbs. IMPRESSION: 1. No acute intracranial hemorrhage. 2. Interval development of hypoattenuation along the anterior aspect of the right fusiform gyrus, favored to reflect subacute to chronic infarct. 3. No acute fracture or traumatic malalignment in the cervical spine. Electronically Signed   By: Orvan Falconer M.D.   On: 09/18/2022 14:30   CT Cervical Spine Wo Contrast  Result Date: 09/18/2022 CLINICAL DATA:  Head trauma, minor (Age >= 65y); Neck trauma (Age >= 65y). Fall with head strike. EXAM: CT HEAD WITHOUT CONTRAST CT CERVICAL SPINE WITHOUT CONTRAST TECHNIQUE: Multidetector CT imaging of the head and cervical spine was performed following the standard protocol without intravenous contrast. Multiplanar CT image reconstructions of the cervical spine were also generated.  RADIATION DOSE REDUCTION: This exam was performed according to the departmental dose-optimization program which includes automated exposure control, adjustment of the mA and/or kV according to patient size and/or use of iterative reconstruction technique. COMPARISON:  Head CT 07/12/2021. FINDINGS: CT HEAD FINDINGS Brain: No acute hemorrhage. Interval development of hypoattenuation along the anterior aspect of the right fusiform gyrus, favored to reflect subacute to chronic infarct. Stable background of mild chronic small-vessel disease. Hydrocephalus or extra-axial collection. No mass effect or midline shift. Vascular: No hyperdense vessel or unexpected calcification. Skull: No calvarial fracture or suspicious bone lesion. Skull base is unremarkable. Sinuses/Orbits: Unremarkable. Other: Right parietal scalp laceration. CT CERVICAL SPINE FINDINGS Alignment: 3 mm degenerative anterolisthesis of C2 on C3. No traumatic malalignment. Skull base and vertebrae: No acute fracture. Normal craniocervical junction. No suspicious bone lesions. Soft tissues and spinal canal: No prevertebral fluid or swelling. No visible canal hematoma. Disc levels: Multilevel cervical spondylosis, worst at C5-6, where there is at least mild spinal canal stenosis. Upper chest: Unremarkable. Other: Atherosclerotic calcifications of the carotid bulbs. IMPRESSION: 1. No acute intracranial hemorrhage. 2. Interval  development of hypoattenuation along the anterior aspect of the right fusiform gyrus, favored to reflect subacute to chronic infarct. 3. No acute fracture or traumatic malalignment in the cervical spine. Electronically Signed   By: Orvan Falconer M.D.   On: 09/18/2022 14:30     PROCEDURES:  Critical Care performed: No  ..Laceration Repair  Date/Time: 09/18/2022 2:45 PM  Performed by: Lissa Hoard, PA-C Authorized by: Lissa Hoard, PA-C   Consent:    Consent obtained:  Verbal   Consent given by:  Patient    Risks, benefits, and alternatives were discussed: yes     Risks discussed:  Pain   Alternatives discussed:  No treatment Universal protocol:    Test results available: yes     Imaging studies available: yes     Patient identity confirmed:  Verbally with patient Anesthesia:    Anesthesia method:  Local infiltration   Local anesthetic:  Procaine 1% WITH epi Laceration details:    Location:  Scalp   Scalp location:  Occipital   Length (cm):  3   Depth (mm):  3 Pre-procedure details:    Preparation:  Patient was prepped and draped in usual sterile fashion Exploration:    Limited defect created (wound extended): no     Hemostasis achieved with:  Epinephrine   Contaminated: no   Treatment:    Area cleansed with:  Soap and water   Amount of cleaning:  Standard   Debridement:  None   Undermining:  None   Scar revision: no   Skin repair:    Repair method:  Tissue adhesive Repair type:    Repair type:  Simple Post-procedure details:    Dressing:  Open (no dressing)   Procedure completion:  Tolerated well, no immediate complications    MEDICATIONS ORDERED IN ED: Medications  lidocaine-EPINEPHrine (XYLOCAINE W/EPI) 1 %-1:100000 (with pres) injection 20 mL (20 mLs Intradermal Given 09/18/22 1521)  traMADol (ULTRAM) tablet 50 mg (50 mg Oral Given 09/18/22 1519)     IMPRESSION / MDM / ASSESSMENT AND PLAN / ED COURSE  I reviewed the triage vital signs and the nursing notes.                              Differential diagnosis includes, but is not limited to, SDH, cranial fracture, scalp abrasion, hematoma, cervical fracture, cervical radiculopathy  Patient's presentation is most consistent with acute complicated illness / injury requiring diagnostic workup.  Patient's diagnosis is consistent with mechanical fall resulting in scalp abrasion and minor head injury.  Patient evaluated with CT and x-ray imaging, all of which revealed no acute processes, based on my interpretation.   Patient will be discharged home with prescriptions for Ultram. Patient is to follow up with his primary care provider as needed or otherwise directed. Patient is given ED precautions to return to the ED for any worsening or new symptoms.  FINAL CLINICAL IMPRESSION(S) / ED DIAGNOSES   Final diagnoses:  Fall, initial encounter  Laceration of scalp, initial encounter  Minor head injury, initial encounter     Rx / DC Orders   ED Discharge Orders          Ordered    traMADol (ULTRAM) 50 MG tablet  2 times daily        09/18/22 1627             Note:  This document was prepared using Dragon voice recognition  software and may include unintentional dictation errors.    Lissa Hoard, PA-C 09/18/22 Mallie Snooks    Jene Every, MD 09/18/22 [phone]

## 2022-09-22 DIAGNOSIS — K7581 Nonalcoholic steatohepatitis (NASH): Secondary | ICD-10-CM | POA: Diagnosis not present

## 2022-09-22 DIAGNOSIS — N179 Acute kidney failure, unspecified: Secondary | ICD-10-CM | POA: Diagnosis not present

## 2022-09-22 DIAGNOSIS — I10 Essential (primary) hypertension: Secondary | ICD-10-CM | POA: Diagnosis not present

## 2022-09-22 DIAGNOSIS — E785 Hyperlipidemia, unspecified: Secondary | ICD-10-CM | POA: Diagnosis not present

## 2022-09-22 DIAGNOSIS — S0191XS Laceration without foreign body of unspecified part of head, sequela: Secondary | ICD-10-CM | POA: Diagnosis not present

## 2022-09-22 DIAGNOSIS — S0191XD Laceration without foreign body of unspecified part of head, subsequent encounter: Secondary | ICD-10-CM | POA: Diagnosis not present

## 2022-09-22 DIAGNOSIS — D649 Anemia, unspecified: Secondary | ICD-10-CM | POA: Diagnosis not present

## 2022-09-22 DIAGNOSIS — Z794 Long term (current) use of insulin: Secondary | ICD-10-CM | POA: Diagnosis not present

## 2022-09-22 DIAGNOSIS — W010XXD Fall on same level from slipping, tripping and stumbling without subsequent striking against object, subsequent encounter: Secondary | ICD-10-CM | POA: Diagnosis not present

## 2022-09-22 DIAGNOSIS — E039 Hypothyroidism, unspecified: Secondary | ICD-10-CM | POA: Diagnosis not present

## 2022-09-22 DIAGNOSIS — E119 Type 2 diabetes mellitus without complications: Secondary | ICD-10-CM | POA: Diagnosis not present

## 2022-09-22 DIAGNOSIS — W1800XA Striking against unspecified object with subsequent fall, initial encounter: Secondary | ICD-10-CM | POA: Diagnosis not present

## 2022-09-23 ENCOUNTER — Telehealth: Payer: Self-pay

## 2022-09-23 NOTE — Telephone Encounter (Signed)
Transition Care Management Unsuccessful Follow-up Telephone Call  Date of discharge and from where:  [facility] 6/13  Attempts:  1st Attempt  Reason for unsuccessful TCM follow-up call:  Left voice message   Charles Wilkinson [facility] Guide, [place] [phone] [address], [place], [place] [address] Phone: [phone] Email: [email or web address]       

## 2022-09-23 NOTE — Telephone Encounter (Signed)
Transition Care Management Unsuccessful Follow-up Telephone Call  Date of discharge and from where:  [facility] 6/13   Attempts:  2nd Attempt  Reason for unsuccessful TCM follow-up call:  Unable to leave message   Lenard [facility] Guide, [facility] [phone] [address] [place], [place], [place] [address] Phone: [phone] Email: [email or web address]

## 2022-10-15 ENCOUNTER — Other Ambulatory Visit: Payer: Self-pay | Admitting: Cardiology

## 2022-11-04 DIAGNOSIS — E785 Hyperlipidemia, unspecified: Secondary | ICD-10-CM | POA: Diagnosis not present

## 2022-11-04 DIAGNOSIS — K7581 Nonalcoholic steatohepatitis (NASH): Secondary | ICD-10-CM | POA: Diagnosis not present

## 2022-11-04 DIAGNOSIS — N1831 Chronic kidney disease, stage 3a: Secondary | ICD-10-CM | POA: Diagnosis not present

## 2022-11-04 DIAGNOSIS — E039 Hypothyroidism, unspecified: Secondary | ICD-10-CM | POA: Diagnosis not present

## 2022-11-04 DIAGNOSIS — D649 Anemia, unspecified: Secondary | ICD-10-CM | POA: Diagnosis not present

## 2022-11-04 DIAGNOSIS — G720 Drug-induced myopathy: Secondary | ICD-10-CM | POA: Diagnosis not present

## 2022-11-04 DIAGNOSIS — I10 Essential (primary) hypertension: Secondary | ICD-10-CM | POA: Diagnosis not present

## 2022-11-04 DIAGNOSIS — M109 Gout, unspecified: Secondary | ICD-10-CM | POA: Diagnosis not present

## 2022-11-04 DIAGNOSIS — E119 Type 2 diabetes mellitus without complications: Secondary | ICD-10-CM | POA: Diagnosis not present

## 2022-11-14 DIAGNOSIS — M79674 Pain in right toe(s): Secondary | ICD-10-CM | POA: Diagnosis not present

## 2022-11-14 DIAGNOSIS — B351 Tinea unguium: Secondary | ICD-10-CM | POA: Diagnosis not present

## 2022-11-14 DIAGNOSIS — M79675 Pain in left toe(s): Secondary | ICD-10-CM | POA: Diagnosis not present

## 2022-11-17 DIAGNOSIS — R300 Dysuria: Secondary | ICD-10-CM | POA: Diagnosis not present

## 2022-11-17 DIAGNOSIS — N390 Urinary tract infection, site not specified: Secondary | ICD-10-CM | POA: Diagnosis not present

## 2022-11-17 DIAGNOSIS — R319 Hematuria, unspecified: Secondary | ICD-10-CM | POA: Diagnosis not present

## 2022-12-12 DIAGNOSIS — I639 Cerebral infarction, unspecified: Secondary | ICD-10-CM | POA: Diagnosis not present

## 2022-12-12 DIAGNOSIS — W19XXXS Unspecified fall, sequela: Secondary | ICD-10-CM | POA: Diagnosis not present

## 2022-12-12 DIAGNOSIS — G20A1 Parkinson's disease without dyskinesia, without mention of fluctuations: Secondary | ICD-10-CM | POA: Diagnosis not present

## 2023-01-08 DIAGNOSIS — R0602 Shortness of breath: Secondary | ICD-10-CM | POA: Diagnosis not present

## 2023-01-08 DIAGNOSIS — R0989 Other specified symptoms and signs involving the circulatory and respiratory systems: Secondary | ICD-10-CM | POA: Diagnosis not present

## 2023-01-08 DIAGNOSIS — R531 Weakness: Secondary | ICD-10-CM | POA: Diagnosis not present

## 2023-01-08 DIAGNOSIS — I771 Stricture of artery: Secondary | ICD-10-CM | POA: Diagnosis not present

## 2023-01-08 DIAGNOSIS — R258 Other abnormal involuntary movements: Secondary | ICD-10-CM | POA: Diagnosis not present

## 2023-01-08 DIAGNOSIS — R8290 Unspecified abnormal findings in urine: Secondary | ICD-10-CM | POA: Diagnosis not present

## 2023-01-08 DIAGNOSIS — J9811 Atelectasis: Secondary | ICD-10-CM | POA: Diagnosis not present

## 2023-02-06 ENCOUNTER — Other Ambulatory Visit
Admission: RE | Admit: 2023-02-06 | Discharge: 2023-02-06 | Disposition: A | Discharge status: Home / Self Care | Payer: Medicare HMO | Source: Ambulatory Visit | Attending: Infectious Diseases | Admitting: Infectious Diseases

## 2023-02-06 DIAGNOSIS — N1831 Chronic kidney disease, stage 3a: Secondary | ICD-10-CM | POA: Insufficient documentation

## 2023-02-06 DIAGNOSIS — R0602 Shortness of breath: Secondary | ICD-10-CM | POA: Diagnosis not present

## 2023-02-06 DIAGNOSIS — E039 Hypothyroidism, unspecified: Secondary | ICD-10-CM | POA: Diagnosis not present

## 2023-02-06 DIAGNOSIS — E785 Hyperlipidemia, unspecified: Secondary | ICD-10-CM | POA: Insufficient documentation

## 2023-02-06 DIAGNOSIS — K7581 Nonalcoholic steatohepatitis (NASH): Secondary | ICD-10-CM | POA: Diagnosis not present

## 2023-02-06 DIAGNOSIS — E119 Type 2 diabetes mellitus without complications: Secondary | ICD-10-CM | POA: Diagnosis not present

## 2023-02-06 DIAGNOSIS — R0609 Other forms of dyspnea: Secondary | ICD-10-CM | POA: Insufficient documentation

## 2023-02-06 DIAGNOSIS — I10 Essential (primary) hypertension: Secondary | ICD-10-CM | POA: Diagnosis not present

## 2023-02-06 DIAGNOSIS — N179 Acute kidney failure, unspecified: Secondary | ICD-10-CM | POA: Diagnosis not present

## 2023-02-06 LAB — BRAIN NATRIURETIC PEPTIDE: B Natriuretic Peptide: 126.9 pg/mL — ABNORMAL HIGH (ref 0.0–100.0)

## 2023-03-18 DIAGNOSIS — D649 Anemia, unspecified: Secondary | ICD-10-CM | POA: Diagnosis not present

## 2023-03-18 DIAGNOSIS — Z8673 Personal history of transient ischemic attack (TIA), and cerebral infarction without residual deficits: Secondary | ICD-10-CM | POA: Diagnosis not present

## 2023-03-18 DIAGNOSIS — K7581 Nonalcoholic steatohepatitis (NASH): Secondary | ICD-10-CM | POA: Diagnosis not present

## 2023-03-18 DIAGNOSIS — N179 Acute kidney failure, unspecified: Secondary | ICD-10-CM | POA: Diagnosis not present

## 2023-03-18 DIAGNOSIS — E119 Type 2 diabetes mellitus without complications: Secondary | ICD-10-CM | POA: Diagnosis not present

## 2023-03-18 DIAGNOSIS — E785 Hyperlipidemia, unspecified: Secondary | ICD-10-CM | POA: Diagnosis not present

## 2023-03-18 DIAGNOSIS — E039 Hypothyroidism, unspecified: Secondary | ICD-10-CM | POA: Diagnosis not present

## 2023-03-18 DIAGNOSIS — I10 Essential (primary) hypertension: Secondary | ICD-10-CM | POA: Diagnosis not present

## 2023-03-20 ENCOUNTER — Other Ambulatory Visit: Payer: Self-pay | Admitting: Infectious Diseases

## 2023-03-20 DIAGNOSIS — I10 Essential (primary) hypertension: Secondary | ICD-10-CM

## 2023-03-20 DIAGNOSIS — I639 Cerebral infarction, unspecified: Secondary | ICD-10-CM

## 2023-03-31 ENCOUNTER — Ambulatory Visit
Admission: RE | Admit: 2023-03-31 | Discharge: 2023-03-31 | Disposition: A | Discharge status: Home / Self Care | Payer: Medicare HMO | Source: Ambulatory Visit | Attending: Infectious Diseases | Admitting: Infectious Diseases

## 2023-03-31 DIAGNOSIS — I10 Essential (primary) hypertension: Secondary | ICD-10-CM | POA: Insufficient documentation

## 2023-03-31 DIAGNOSIS — I639 Cerebral infarction, unspecified: Secondary | ICD-10-CM | POA: Insufficient documentation

## 2023-03-31 DIAGNOSIS — R42 Dizziness and giddiness: Secondary | ICD-10-CM | POA: Diagnosis not present

## 2023-04-07 DIAGNOSIS — M79675 Pain in left toe(s): Secondary | ICD-10-CM | POA: Diagnosis not present

## 2023-04-07 DIAGNOSIS — B351 Tinea unguium: Secondary | ICD-10-CM | POA: Diagnosis not present

## 2023-04-07 DIAGNOSIS — M79674 Pain in right toe(s): Secondary | ICD-10-CM | POA: Diagnosis not present

## 2023-05-01 DIAGNOSIS — N179 Acute kidney failure, unspecified: Secondary | ICD-10-CM | POA: Diagnosis not present

## 2023-05-01 DIAGNOSIS — G20A1 Parkinson's disease without dyskinesia, without mention of fluctuations: Secondary | ICD-10-CM | POA: Diagnosis not present

## 2023-05-01 DIAGNOSIS — E119 Type 2 diabetes mellitus without complications: Secondary | ICD-10-CM | POA: Diagnosis not present

## 2023-05-01 DIAGNOSIS — I8500 Esophageal varices without bleeding: Secondary | ICD-10-CM | POA: Diagnosis not present

## 2023-05-01 DIAGNOSIS — D696 Thrombocytopenia, unspecified: Secondary | ICD-10-CM | POA: Diagnosis not present

## 2023-05-01 DIAGNOSIS — Z0000 Encounter for general adult medical examination without abnormal findings: Secondary | ICD-10-CM | POA: Diagnosis not present

## 2023-05-01 DIAGNOSIS — I639 Cerebral infarction, unspecified: Secondary | ICD-10-CM | POA: Diagnosis not present

## 2023-07-14 DIAGNOSIS — G20A1 Parkinson's disease without dyskinesia, without mention of fluctuations: Secondary | ICD-10-CM | POA: Diagnosis not present

## 2023-07-14 DIAGNOSIS — E039 Hypothyroidism, unspecified: Secondary | ICD-10-CM | POA: Diagnosis not present

## 2023-07-14 DIAGNOSIS — I10 Essential (primary) hypertension: Secondary | ICD-10-CM | POA: Diagnosis not present

## 2023-07-14 DIAGNOSIS — Z8673 Personal history of transient ischemic attack (TIA), and cerebral infarction without residual deficits: Secondary | ICD-10-CM | POA: Diagnosis not present

## 2023-07-14 DIAGNOSIS — D649 Anemia, unspecified: Secondary | ICD-10-CM | POA: Diagnosis not present

## 2023-07-14 DIAGNOSIS — E119 Type 2 diabetes mellitus without complications: Secondary | ICD-10-CM | POA: Diagnosis not present

## 2023-07-14 DIAGNOSIS — J329 Chronic sinusitis, unspecified: Secondary | ICD-10-CM | POA: Diagnosis not present

## 2023-07-14 DIAGNOSIS — E785 Hyperlipidemia, unspecified: Secondary | ICD-10-CM | POA: Diagnosis not present

## 2023-07-14 DIAGNOSIS — M79604 Pain in right leg: Secondary | ICD-10-CM | POA: Diagnosis not present

## 2023-09-03 DIAGNOSIS — B351 Tinea unguium: Secondary | ICD-10-CM | POA: Diagnosis not present

## 2023-09-03 DIAGNOSIS — M79674 Pain in right toe(s): Secondary | ICD-10-CM | POA: Diagnosis not present

## 2023-09-03 DIAGNOSIS — M79675 Pain in left toe(s): Secondary | ICD-10-CM | POA: Diagnosis not present

## 2023-09-21 DIAGNOSIS — R300 Dysuria: Secondary | ICD-10-CM | POA: Diagnosis not present

## 2023-10-08 DIAGNOSIS — M79604 Pain in right leg: Secondary | ICD-10-CM | POA: Diagnosis not present

## 2023-10-08 DIAGNOSIS — G20A1 Parkinson's disease without dyskinesia, without mention of fluctuations: Secondary | ICD-10-CM | POA: Diagnosis not present

## 2023-10-08 DIAGNOSIS — I639 Cerebral infarction, unspecified: Secondary | ICD-10-CM | POA: Diagnosis not present

## 2023-10-08 DIAGNOSIS — E1159 Type 2 diabetes mellitus with other circulatory complications: Secondary | ICD-10-CM | POA: Diagnosis not present

## 2023-10-08 DIAGNOSIS — J329 Chronic sinusitis, unspecified: Secondary | ICD-10-CM | POA: Diagnosis not present

## 2023-10-08 DIAGNOSIS — E785 Hyperlipidemia, unspecified: Secondary | ICD-10-CM | POA: Diagnosis not present

## 2023-10-08 DIAGNOSIS — E039 Hypothyroidism, unspecified: Secondary | ICD-10-CM | POA: Diagnosis not present

## 2023-10-08 DIAGNOSIS — I10 Essential (primary) hypertension: Secondary | ICD-10-CM | POA: Diagnosis not present

## 2023-10-08 DIAGNOSIS — D649 Anemia, unspecified: Secondary | ICD-10-CM | POA: Diagnosis not present

## 2023-10-12 DIAGNOSIS — E119 Type 2 diabetes mellitus without complications: Secondary | ICD-10-CM | POA: Diagnosis not present

## 2023-10-12 DIAGNOSIS — I10 Essential (primary) hypertension: Secondary | ICD-10-CM | POA: Diagnosis not present

## 2023-10-12 DIAGNOSIS — E785 Hyperlipidemia, unspecified: Secondary | ICD-10-CM | POA: Diagnosis not present

## 2023-10-12 DIAGNOSIS — G20A1 Parkinson's disease without dyskinesia, without mention of fluctuations: Secondary | ICD-10-CM | POA: Diagnosis not present

## 2023-10-12 DIAGNOSIS — E039 Hypothyroidism, unspecified: Secondary | ICD-10-CM | POA: Diagnosis not present

## 2023-10-12 DIAGNOSIS — D696 Thrombocytopenia, unspecified: Secondary | ICD-10-CM | POA: Diagnosis not present

## 2023-10-13 NOTE — Progress Notes (Signed)
 ENCOUNTER: Patient Class :No patient class for patient encounter Department: [facility] CLINIC [address] [place] [address]  PATIENT: Patient Demographics      Name Patient ID SSN Gender Identity Birth Date   Charles Wilkinson, Charles Wilkinson [id number] Male 06/12/2045 ([address] y[address])          Address Phone Email       [address] [place] [address] [phone] (M) [email or web address]            [facility] Caucasian/White             Reg Status PCP Date Last Verified Next Review Date     Verified Epifanio Alm [place] [phone] 10/12/23 11/11/23           Marital Status Religion Language       Married Catholic English              EMERGENCY CONTACT: Name Relationship Lgl Grd Work Administrator, sports  1. AMIL, BOUWMAN or Daughter  [phone]    GUARANTOR: There is no guarantor information entered for this encounter.  COVERAGE: Primary Visit Coverage      Payer Plan Group Number Group Name Payer Phone Plan Phone   No coverage found                Secondary Visit Coverage      Payer Plan Group Number Group Name Payer Phone Plan Phone   No coverage found                Primary Coverage      Payer Plan Group Number Group Name Payer Phone Plan Phone   [facility] MEDICARE ADVANTAGE SABLE CHUTE [facility] GOLD PLUS [id number] [facility] [place].  [id number]           Primary Subscriber      Subscriber ID Subscriber Name Subscriber [facility] Subscriber Address   [id number] [facility] R [id number] [address]      [place], [place] [address]           Secondary Coverage      Payer Plan Group Number Group Name Payer Phone Plan Phone   [facility] [employer] [facility] [employer]    [phone]           Secondary Subscriber      Subscriber ID Subscriber Name Subscriber [facility]  Subscriber Address   [id number] [facility] [id number] [address]      [place], [place] [address]

## 2023-10-29 DIAGNOSIS — I10 Essential (primary) hypertension: Secondary | ICD-10-CM | POA: Diagnosis not present

## 2023-10-29 DIAGNOSIS — E039 Hypothyroidism, unspecified: Secondary | ICD-10-CM | POA: Diagnosis not present

## 2023-10-29 DIAGNOSIS — E1159 Type 2 diabetes mellitus with other circulatory complications: Secondary | ICD-10-CM | POA: Diagnosis not present

## 2023-12-07 ENCOUNTER — Other Ambulatory Visit: Payer: Self-pay

## 2023-12-07 ENCOUNTER — Emergency Department

## 2023-12-07 ENCOUNTER — Emergency Department
Admission: EM | Admit: 2023-12-07 | Discharge: 2023-12-07 | Disposition: A | Discharge status: Home / Self Care | Attending: Emergency Medicine | Admitting: Emergency Medicine

## 2023-12-07 DIAGNOSIS — I700 Atherosclerosis of aorta: Secondary | ICD-10-CM | POA: Diagnosis not present

## 2023-12-07 DIAGNOSIS — E1122 Type 2 diabetes mellitus with diabetic chronic kidney disease: Secondary | ICD-10-CM | POA: Insufficient documentation

## 2023-12-07 DIAGNOSIS — N189 Chronic kidney disease, unspecified: Secondary | ICD-10-CM | POA: Diagnosis not present

## 2023-12-07 DIAGNOSIS — M549 Dorsalgia, unspecified: Secondary | ICD-10-CM | POA: Diagnosis not present

## 2023-12-07 DIAGNOSIS — S2231XA Fracture of one rib, right side, initial encounter for closed fracture: Secondary | ICD-10-CM | POA: Diagnosis not present

## 2023-12-07 DIAGNOSIS — W19XXXA Unspecified fall, initial encounter: Secondary | ICD-10-CM | POA: Diagnosis not present

## 2023-12-07 DIAGNOSIS — S299XXA Unspecified injury of thorax, initial encounter: Secondary | ICD-10-CM | POA: Diagnosis present

## 2023-12-07 DIAGNOSIS — Z743 Need for continuous supervision: Secondary | ICD-10-CM | POA: Diagnosis not present

## 2023-12-07 MED ORDER — LIDOCAINE 5 % EX PTCH
1.0000 | MEDICATED_PATCH | CUTANEOUS | Status: DC
  Administered 2023-12-07: 1 via TRANSDERMAL
  Filled 2023-12-07: qty 1

## 2023-12-07 MED ORDER — OXYCODONE HCL 5 MG PO TABS: 2.5000 mg | ORAL_TABLET | Freq: Four times a day (QID) | ORAL | 0 refills | Status: DC | PRN

## 2023-12-07 NOTE — ED Triage Notes (Addendum)
 Pt to ED via [facility] from apartments. Pt had unwitnessed fall last pm into night stand. Pt states shoe caught on rug causing him to fall. Pt reports right sided back pain/rib pain. Pt was able to get up after fall. No head trauma. Pt concerned for continued pain. No LOC. No blood thinners. Pt does reports some SOB after fall from pain.   122/69 98% RA 96 HR

## 2023-12-07 NOTE — ED Notes (Addendum)
 See triage note  States he tripped and fell last pm  Hitting night stand    Having pain to right rib area  Was able to get up but now having more pain

## 2023-12-07 NOTE — ED Provider Notes (Signed)
 [facility] Provider Note    Event Date/Time   First MD Initiated Contact with Patient 12/07/23 [phone]     (approximate)   History   Fall   HPI  Elfego Giammarino is a 78 y.o. male with a history of diabetes, CKD who presents with complaints of right posterior chest pain status post fall that occurred last night.  He denies difficulty breathing.  No head injury, no neck pain.  No extremity injuries.  Took Tylenol  for pain     Physical Exam   Triage Vital Signs: ED Triage Vitals  Encounter Vitals Group     BP 12/07/23 0803 (!) 154/72     Girls Systolic BP Percentile --      Girls Diastolic BP Percentile --      Boys Systolic BP Percentile --      Boys Diastolic BP Percentile --      Pulse Rate 12/07/23 0803 93     Resp 12/07/23 0803 18     Temp 12/07/23 0803 97.9 F (36.6 C)     Temp Source 12/07/23 0803 Oral     SpO2 12/07/23 0803 97 %     Weight 12/07/23 0816 82.1 kg (181 lb)     Height 12/07/23 0816 1.778 m (5' 10)     Head Circumference --      Peak Flow --      Pain Score 12/07/23 0804 7     Pain Loc --      Pain Education --      Exclude from Growth Chart --     Most recent vital signs: Vitals:   12/07/23 0803  BP: (!) 154/72  Pulse: 93  Resp: 18  Temp: 97.9 F (36.6 C)  SpO2: 97%     General: Awake, no distress.  CV:  Good peripheral perfusion.  Tenderness posterior right chest, no bruising or bony abnormalities palpated Resp:  Normal effort.  Abd:  No distention.  Other:  No pain with axial load on both hips, normal range of motion of the upper extremities.  No vertebral tenderness to palpation.   ED Results / Procedures / Treatments   Labs (all labs ordered are listed, but only abnormal results are displayed) Labs Reviewed - No data to display   EKG     RADIOLOGY X-ray viewed interpret by me, 10th rib fracture noted    PROCEDURES:  Critical Care performed:   Procedures   MEDICATIONS ORDERED IN  ED: Medications  lidocaine  (LIDODERM ) 5 % 1 patch (1 patch Transdermal Patch Applied 12/07/23 0945)     IMPRESSION / MDM / ASSESSMENT AND PLAN / ED COURSE  I reviewed the triage vital signs and the nursing notes. Patient's presentation is most consistent with acute complicated illness / injury requiring diagnostic workup.  Patient presents with fall as detailed above, concerning for rib contusion versus rib fracture.  Will obtain x-rays, he has declined pain medication at this time  X-ray consistent with right 10th rib fracture, discussed with he and his daughter about pain control, he lives in independent living, but does have help, appropriate for discharge at this time      FINAL CLINICAL IMPRESSION(S) / ED DIAGNOSES   Final diagnoses:  Fall, initial encounter  Closed fracture of one rib of right side, initial encounter     Rx / DC Orders   ED Discharge Orders          Ordered    oxyCODONE  (ROXICODONE ) 5 MG  immediate release tablet  Every 6 hours PRN        12/07/23 0935             Note:  This document was prepared using Dragon voice recognition software and may include unintentional dictation errors.   Arlander Charleston, MD 12/07/23 1051

## 2023-12-15 DIAGNOSIS — E039 Hypothyroidism, unspecified: Secondary | ICD-10-CM | POA: Diagnosis not present

## 2023-12-15 DIAGNOSIS — Z7409 Other reduced mobility: Secondary | ICD-10-CM | POA: Diagnosis not present

## 2023-12-15 DIAGNOSIS — N189 Chronic kidney disease, unspecified: Secondary | ICD-10-CM | POA: Diagnosis not present

## 2023-12-15 DIAGNOSIS — W1800XD Striking against unspecified object with subsequent fall, subsequent encounter: Secondary | ICD-10-CM | POA: Diagnosis not present

## 2023-12-15 DIAGNOSIS — E785 Hyperlipidemia, unspecified: Secondary | ICD-10-CM | POA: Diagnosis not present

## 2023-12-15 DIAGNOSIS — Z8781 Personal history of (healed) traumatic fracture: Secondary | ICD-10-CM | POA: Diagnosis not present

## 2023-12-15 DIAGNOSIS — D631 Anemia in chronic kidney disease: Secondary | ICD-10-CM | POA: Diagnosis not present

## 2023-12-15 DIAGNOSIS — E1122 Type 2 diabetes mellitus with diabetic chronic kidney disease: Secondary | ICD-10-CM | POA: Diagnosis not present

## 2023-12-15 DIAGNOSIS — I129 Hypertensive chronic kidney disease with stage 1 through stage 4 chronic kidney disease, or unspecified chronic kidney disease: Secondary | ICD-10-CM | POA: Diagnosis not present
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
## 2023-12-17 ENCOUNTER — Other Ambulatory Visit: Payer: Self-pay

## 2023-12-17 ENCOUNTER — Emergency Department

## 2023-12-17 ENCOUNTER — Inpatient Hospital Stay
Admission: EM | Admit: 2023-12-17 | Discharge: 2023-12-24 | DRG: 683 | Disposition: A | Discharge status: Skilled Nursing Facility | Source: Skilled Nursing Facility | Attending: Internal Medicine | Admitting: Internal Medicine

## 2023-12-17 DIAGNOSIS — S2241XA Multiple fractures of ribs, right side, initial encounter for closed fracture: Principal | ICD-10-CM | POA: Diagnosis present

## 2023-12-17 DIAGNOSIS — Z8673 Personal history of transient ischemic attack (TIA), and cerebral infarction without residual deficits: Secondary | ICD-10-CM

## 2023-12-17 DIAGNOSIS — S2249XA Multiple fractures of ribs, unspecified side, initial encounter for closed fracture: Secondary | ICD-10-CM

## 2023-12-17 DIAGNOSIS — Z794 Long term (current) use of insulin: Secondary | ICD-10-CM | POA: Diagnosis not present

## 2023-12-17 DIAGNOSIS — E1169 Type 2 diabetes mellitus with other specified complication: Secondary | ICD-10-CM | POA: Diagnosis present

## 2023-12-17 DIAGNOSIS — I700 Atherosclerosis of aorta: Secondary | ICD-10-CM | POA: Diagnosis present

## 2023-12-17 DIAGNOSIS — Z79890 Hormone replacement therapy (postmenopausal): Secondary | ICD-10-CM | POA: Diagnosis not present

## 2023-12-17 DIAGNOSIS — K7460 Unspecified cirrhosis of liver: Secondary | ICD-10-CM | POA: Diagnosis present

## 2023-12-17 DIAGNOSIS — Y92009 Unspecified place in unspecified non-institutional (private) residence as the place of occurrence of the external cause: Secondary | ICD-10-CM | POA: Diagnosis not present

## 2023-12-17 DIAGNOSIS — E871 Hypo-osmolality and hyponatremia: Secondary | ICD-10-CM | POA: Diagnosis not present

## 2023-12-17 DIAGNOSIS — I129 Hypertensive chronic kidney disease with stage 1 through stage 4 chronic kidney disease, or unspecified chronic kidney disease: Secondary | ICD-10-CM | POA: Diagnosis present

## 2023-12-17 DIAGNOSIS — L89322 Pressure ulcer of left buttock, stage 2: Secondary | ICD-10-CM | POA: Diagnosis present

## 2023-12-17 DIAGNOSIS — I10 Essential (primary) hypertension: Secondary | ICD-10-CM | POA: Diagnosis not present

## 2023-12-17 DIAGNOSIS — S2241XD Multiple fractures of ribs, right side, subsequent encounter for fracture with routine healing: Secondary | ICD-10-CM

## 2023-12-17 DIAGNOSIS — I639 Cerebral infarction, unspecified: Secondary | ICD-10-CM | POA: Diagnosis present

## 2023-12-17 DIAGNOSIS — Z7982 Long term (current) use of aspirin: Secondary | ICD-10-CM | POA: Diagnosis not present

## 2023-12-17 DIAGNOSIS — W19XXXA Unspecified fall, initial encounter: Secondary | ICD-10-CM | POA: Diagnosis present

## 2023-12-17 DIAGNOSIS — Z833 Family history of diabetes mellitus: Secondary | ICD-10-CM

## 2023-12-17 DIAGNOSIS — N1831 Chronic kidney disease, stage 3a: Secondary | ICD-10-CM | POA: Diagnosis present

## 2023-12-17 DIAGNOSIS — K219 Gastro-esophageal reflux disease without esophagitis: Secondary | ICD-10-CM | POA: Diagnosis present

## 2023-12-17 DIAGNOSIS — N183 Chronic kidney disease, stage 3 unspecified: Secondary | ICD-10-CM | POA: Diagnosis not present

## 2023-12-17 DIAGNOSIS — Z743 Need for continuous supervision: Secondary | ICD-10-CM | POA: Diagnosis not present

## 2023-12-17 DIAGNOSIS — Y92239 Unspecified place in hospital as the place of occurrence of the external cause: Secondary | ICD-10-CM | POA: Diagnosis present

## 2023-12-17 DIAGNOSIS — R1011 Right upper quadrant pain: Secondary | ICD-10-CM | POA: Diagnosis present

## 2023-12-17 DIAGNOSIS — K8689 Other specified diseases of pancreas: Secondary | ICD-10-CM | POA: Diagnosis not present

## 2023-12-17 DIAGNOSIS — R109 Unspecified abdominal pain: Secondary | ICD-10-CM | POA: Diagnosis not present

## 2023-12-17 DIAGNOSIS — I6381 Other cerebral infarction due to occlusion or stenosis of small artery: Secondary | ICD-10-CM | POA: Diagnosis not present

## 2023-12-17 DIAGNOSIS — N178 Other acute kidney failure: Secondary | ICD-10-CM | POA: Diagnosis present

## 2023-12-17 DIAGNOSIS — M549 Dorsalgia, unspecified: Secondary | ICD-10-CM | POA: Diagnosis present

## 2023-12-17 DIAGNOSIS — G20A1 Parkinson's disease without dyskinesia, without mention of fluctuations: Secondary | ICD-10-CM | POA: Diagnosis present

## 2023-12-17 DIAGNOSIS — R161 Splenomegaly, not elsewhere classified: Secondary | ICD-10-CM | POA: Diagnosis present

## 2023-12-17 DIAGNOSIS — J9811 Atelectasis: Secondary | ICD-10-CM | POA: Diagnosis not present

## 2023-12-17 DIAGNOSIS — L8990 Pressure ulcer of unspecified site, unspecified stage: Secondary | ICD-10-CM | POA: Insufficient documentation

## 2023-12-17 DIAGNOSIS — I2510 Atherosclerotic heart disease of native coronary artery without angina pectoris: Secondary | ICD-10-CM | POA: Diagnosis not present

## 2023-12-17 DIAGNOSIS — E222 Syndrome of inappropriate secretion of antidiuretic hormone: Secondary | ICD-10-CM | POA: Diagnosis present

## 2023-12-17 DIAGNOSIS — E785 Hyperlipidemia, unspecified: Secondary | ICD-10-CM | POA: Diagnosis present

## 2023-12-17 DIAGNOSIS — D6959 Other secondary thrombocytopenia: Secondary | ICD-10-CM | POA: Diagnosis present

## 2023-12-17 DIAGNOSIS — K766 Portal hypertension: Secondary | ICD-10-CM | POA: Diagnosis present

## 2023-12-17 DIAGNOSIS — W19XXXD Unspecified fall, subsequent encounter: Secondary | ICD-10-CM | POA: Diagnosis not present

## 2023-12-17 DIAGNOSIS — N1411 Contrast-induced nephropathy: Secondary | ICD-10-CM | POA: Diagnosis present

## 2023-12-17 DIAGNOSIS — E1122 Type 2 diabetes mellitus with diabetic chronic kidney disease: Secondary | ICD-10-CM | POA: Diagnosis present

## 2023-12-17 DIAGNOSIS — Z7984 Long term (current) use of oral hypoglycemic drugs: Secondary | ICD-10-CM

## 2023-12-17 DIAGNOSIS — R0602 Shortness of breath: Secondary | ICD-10-CM

## 2023-12-17 DIAGNOSIS — S32019A Unspecified fracture of first lumbar vertebra, initial encounter for closed fracture: Secondary | ICD-10-CM | POA: Diagnosis present

## 2023-12-17 DIAGNOSIS — R772 Abnormality of alphafetoprotein: Secondary | ICD-10-CM | POA: Diagnosis present

## 2023-12-17 DIAGNOSIS — K7581 Nonalcoholic steatohepatitis (NASH): Secondary | ICD-10-CM | POA: Diagnosis present

## 2023-12-17 DIAGNOSIS — Z8249 Family history of ischemic heart disease and other diseases of the circulatory system: Secondary | ICD-10-CM | POA: Diagnosis not present

## 2023-12-17 DIAGNOSIS — T508X5A Adverse effect of diagnostic agents, initial encounter: Secondary | ICD-10-CM | POA: Diagnosis present

## 2023-12-17 DIAGNOSIS — Z1152 Encounter for screening for COVID-19: Secondary | ICD-10-CM

## 2023-12-17 DIAGNOSIS — Z7401 Bed confinement status: Secondary | ICD-10-CM | POA: Diagnosis not present

## 2023-12-17 DIAGNOSIS — R296 Repeated falls: Secondary | ICD-10-CM | POA: Diagnosis present

## 2023-12-17 DIAGNOSIS — I714 Abdominal aortic aneurysm, without rupture, unspecified: Secondary | ICD-10-CM | POA: Diagnosis present

## 2023-12-17 DIAGNOSIS — I6782 Cerebral ischemia: Secondary | ICD-10-CM | POA: Diagnosis not present

## 2023-12-17 DIAGNOSIS — E782 Mixed hyperlipidemia: Secondary | ICD-10-CM | POA: Diagnosis not present

## 2023-12-17 DIAGNOSIS — Z9181 History of falling: Secondary | ICD-10-CM

## 2023-12-17 DIAGNOSIS — N179 Acute kidney failure, unspecified: Secondary | ICD-10-CM | POA: Diagnosis not present

## 2023-12-17 DIAGNOSIS — K869 Disease of pancreas, unspecified: Secondary | ICD-10-CM | POA: Diagnosis present

## 2023-12-17 DIAGNOSIS — S2239XA Fracture of one rib, unspecified side, initial encounter for closed fracture: Secondary | ICD-10-CM | POA: Diagnosis present

## 2023-12-17 DIAGNOSIS — R54 Age-related physical debility: Secondary | ICD-10-CM | POA: Diagnosis present

## 2023-12-17 DIAGNOSIS — E039 Hypothyroidism, unspecified: Secondary | ICD-10-CM | POA: Diagnosis present

## 2023-12-17 DIAGNOSIS — Z87891 Personal history of nicotine dependence: Secondary | ICD-10-CM

## 2023-12-17 DIAGNOSIS — R9082 White matter disease, unspecified: Secondary | ICD-10-CM | POA: Diagnosis not present

## 2023-12-17 DIAGNOSIS — Z9049 Acquired absence of other specified parts of digestive tract: Secondary | ICD-10-CM

## 2023-12-17 DIAGNOSIS — Z79899 Other long term (current) drug therapy: Secondary | ICD-10-CM

## 2023-12-17 DIAGNOSIS — S32018D Other fracture of first lumbar vertebra, subsequent encounter for fracture with routine healing: Secondary | ICD-10-CM | POA: Diagnosis not present

## 2023-12-17 DIAGNOSIS — R29810 Facial weakness: Secondary | ICD-10-CM | POA: Diagnosis not present

## 2023-12-17 LAB — COMPREHENSIVE METABOLIC PANEL WITH GFR
ALT: 30 U/L (ref 0–44)
AST: 29 U/L (ref 15–41)
Albumin: 3.6 g/dL (ref 3.5–5.0)
Alkaline Phosphatase: 120 U/L (ref 38–126)
Anion gap: 9 (ref 5–15)
BUN: 35 mg/dL — ABNORMAL HIGH (ref 8–23)
CO2: 25 mmol/L (ref 22–32)
Calcium: 9.1 mg/dL (ref 8.9–10.3)
Chloride: 96 mmol/L — ABNORMAL LOW (ref 98–111)
Creatinine, Ser: 1.65 mg/dL — ABNORMAL HIGH (ref 0.61–1.24)
GFR, Estimated: 42 mL/min — ABNORMAL LOW (ref >60)
Glucose, Bld: 134 mg/dL — ABNORMAL HIGH (ref 70–99)
Potassium: 4.4 mmol/L (ref 3.5–5.1)
Sodium: 130 mmol/L — ABNORMAL LOW (ref 135–145)
Total Bilirubin: 1.7 mg/dL — ABNORMAL HIGH (ref 0.0–1.2)
Total Protein: 7.4 g/dL (ref 6.5–8.1)

## 2023-12-17 LAB — RESP PANEL BY RT-PCR (RSV, FLU A&B, COVID)  RVPGX2
Influenza A by PCR: NEGATIVE
Influenza B by PCR: NEGATIVE
Resp Syncytial Virus by PCR: NEGATIVE
SARS Coronavirus 2 by RT PCR: NEGATIVE

## 2023-12-17 LAB — CBC
HCT: 39.1 % (ref 39.0–52.0)
Hemoglobin: 13.1 g/dL (ref 13.0–17.0)
MCH: 31.1 pg (ref 26.0–34.0)
MCHC: 33.5 g/dL (ref 30.0–36.0)
MCV: 92.9 fL (ref 80.0–100.0)
Platelets: 90 K/uL — ABNORMAL LOW (ref 150–400)
RBC: 4.21 MIL/uL — ABNORMAL LOW (ref 4.22–5.81)
RDW: 14.6 % (ref 11.5–15.5)
WBC: 3.5 K/uL — ABNORMAL LOW (ref 4.0–10.5)
nRBC: 0 % (ref 0.0–0.2)

## 2023-12-17 LAB — URINALYSIS, ROUTINE W REFLEX MICROSCOPIC
Bacteria, UA: NONE SEEN
Bilirubin Urine: NEGATIVE
Glucose, UA: >=500 mg/dL — AB
Hgb urine dipstick: NEGATIVE
Ketones, ur: NEGATIVE mg/dL
Leukocytes,Ua: NEGATIVE
Nitrite: NEGATIVE
Protein, ur: NEGATIVE mg/dL
Specific Gravity, Urine: 1.005 (ref 1.005–1.030)
pH: 7 (ref 5.0–8.0)

## 2023-12-17 LAB — LIPASE, BLOOD: Lipase: 47 U/L (ref 11–51)

## 2023-12-17 LAB — TYPE AND SCREEN
ABO/RH(D): O POS
Antibody Screen: NEGATIVE

## 2023-12-17 LAB — HEMOGLOBIN A1C
Hgb A1c MFr Bld: 6.3 % — ABNORMAL HIGH (ref 4.8–5.6)
Mean Plasma Glucose: 134.11 mg/dL

## 2023-12-17 LAB — TROPONIN I (HIGH SENSITIVITY)
Troponin I (High Sensitivity): 7 ng/L (ref <18)
Troponin I (High Sensitivity): 7 ng/L (ref <18)

## 2023-12-17 LAB — GLUCOSE, CAPILLARY: Glucose-Capillary: 118 mg/dL — ABNORMAL HIGH (ref 70–99)

## 2023-12-17 LAB — PROTIME-INR
INR: 1 (ref 0.8–1.2)
Prothrombin Time: 14.2 s (ref 11.4–15.2)

## 2023-12-17 LAB — CBG MONITORING, ED: Glucose-Capillary: 150 mg/dL — ABNORMAL HIGH (ref 70–99)

## 2023-12-17 MED ORDER — POLYETHYLENE GLYCOL 3350 17 G PO PACK: 17.0000 g | PACK | Freq: Every day | ORAL | Status: DC | PRN

## 2023-12-17 MED ORDER — ONDANSETRON HCL 4 MG/2ML IJ SOLN: 4.0000 mg | Freq: Four times a day (QID) | INTRAMUSCULAR | Status: DC | PRN

## 2023-12-17 MED ORDER — ASPIRIN 81 MG PO TBEC
81.0000 mg | DELAYED_RELEASE_TABLET | Freq: Every day | ORAL | Status: DC
  Administered 2023-12-17 – 2023-12-24 (×8): 81 mg via ORAL
  Filled 2023-12-17 (×8): qty 1

## 2023-12-17 MED ORDER — FUROSEMIDE 20 MG PO TABS
20.0000 mg | ORAL_TABLET | Freq: Every morning | ORAL | Status: DC
Start: 2023-12-17 — End: 2023-12-19
  Administered 2023-12-18 – 2023-12-19 (×2): 20 mg via ORAL
  Filled 2023-12-17 (×3): qty 1

## 2023-12-17 MED ORDER — INSULIN ASPART 100 UNIT/ML IJ SOLN
0.0000 [IU] | Freq: Three times a day (TID) | INTRAMUSCULAR | Status: DC
  Administered 2023-12-19 – 2023-12-21 (×3): 1 [IU] via SUBCUTANEOUS
  Administered 2023-12-21: 2 [IU] via SUBCUTANEOUS
  Administered 2023-12-21: 1 [IU] via SUBCUTANEOUS
  Administered 2023-12-22: 2 [IU] via SUBCUTANEOUS
  Administered 2023-12-22: 1 [IU] via SUBCUTANEOUS
  Administered 2023-12-23: 3 [IU] via SUBCUTANEOUS
  Administered 2023-12-23: 2 [IU] via SUBCUTANEOUS
  Administered 2023-12-23 – 2023-12-24 (×2): 1 [IU] via SUBCUTANEOUS
  Administered 2023-12-24: 2 [IU] via SUBCUTANEOUS
  Filled 2023-12-17 (×10): qty 1

## 2023-12-17 MED ORDER — FENTANYL CITRATE PF 50 MCG/ML IJ SOSY
50.0000 ug | PREFILLED_SYRINGE | Freq: Once | INTRAMUSCULAR | Status: AC
  Administered 2023-12-17: 50 ug via INTRAVENOUS
  Filled 2023-12-17: qty 1

## 2023-12-17 MED ORDER — LATANOPROST 0.005 % OP SOLN
1.0000 [drp] | Freq: Every day | OPHTHALMIC | Status: DC
  Administered 2023-12-17 – 2023-12-23 (×5): 1 [drp] via OPHTHALMIC
  Filled 2023-12-17 (×2): qty 2.5

## 2023-12-17 MED ORDER — MORPHINE SULFATE (PF) 2 MG/ML IV SOLN: 1.0000 mg | INTRAVENOUS | Status: DC | PRN

## 2023-12-17 MED ORDER — OXYCODONE HCL 5 MG PO TABS
5.0000 mg | ORAL_TABLET | ORAL | Status: DC | PRN
  Administered 2023-12-17: 5 mg via ORAL
  Filled 2023-12-17: qty 1

## 2023-12-17 MED ORDER — PANTOPRAZOLE SODIUM 40 MG PO TBEC
40.0000 mg | DELAYED_RELEASE_TABLET | Freq: Two times a day (BID) | ORAL | Status: DC
  Administered 2023-12-17 – 2023-12-24 (×15): 40 mg via ORAL
  Filled 2023-12-17 (×15): qty 1

## 2023-12-17 MED ORDER — FERROUS SULFATE 325 (65 FE) MG PO TABS
325.0000 mg | ORAL_TABLET | Freq: Every day | ORAL | Status: DC
  Administered 2023-12-18 – 2023-12-24 (×7): 325 mg via ORAL
  Filled 2023-12-17 (×7): qty 1

## 2023-12-17 MED ORDER — MORPHINE SULFATE (PF) 2 MG/ML IV SOLN: 2.0000 mg | INTRAVENOUS | Status: DC | PRN

## 2023-12-17 MED ORDER — OXYCODONE HCL 5 MG PO TABS
2.5000 mg | ORAL_TABLET | Freq: Once | ORAL | Status: AC
  Administered 2023-12-17: 2.5 mg via ORAL
  Filled 2023-12-17: qty 1

## 2023-12-17 MED ORDER — ACETAMINOPHEN 325 MG PO TABS
650.0000 mg | ORAL_TABLET | Freq: Four times a day (QID) | ORAL | Status: DC | PRN
  Administered 2023-12-17: 650 mg via ORAL
  Filled 2023-12-17: qty 2

## 2023-12-17 MED ORDER — ONDANSETRON HCL 4 MG PO TABS: 4.0000 mg | ORAL_TABLET | Freq: Four times a day (QID) | ORAL | Status: DC | PRN

## 2023-12-17 MED ORDER — ENOXAPARIN SODIUM 40 MG/0.4ML IJ SOSY
40.0000 mg | PREFILLED_SYRINGE | INTRAMUSCULAR | Status: DC
  Administered 2023-12-17: 40 mg via SUBCUTANEOUS
  Filled 2023-12-17 (×2): qty 0.4

## 2023-12-17 MED ORDER — INSULIN ASPART 100 UNIT/ML IJ SOLN: 0.0000 [IU] | Freq: Every day | INTRAMUSCULAR | Status: DC

## 2023-12-17 MED ORDER — ACETAMINOPHEN 650 MG RE SUPP: 650.0000 mg | Freq: Four times a day (QID) | RECTAL | Status: DC | PRN

## 2023-12-17 MED ORDER — ONDANSETRON HCL 4 MG/2ML IJ SOLN
4.0000 mg | Freq: Once | INTRAMUSCULAR | Status: AC
Start: 2023-12-17 — End: 2023-12-17
  Administered 2023-12-17: 4 mg via INTRAVENOUS
  Filled 2023-12-17: qty 2

## 2023-12-17 MED ORDER — LEVOTHYROXINE SODIUM 50 MCG PO TABS
150.0000 ug | ORAL_TABLET | Freq: Every day | ORAL | Status: DC
  Administered 2023-12-17 – 2023-12-24 (×8): 150 ug via ORAL
  Filled 2023-12-17 (×7): qty 1
  Filled 2023-12-17: qty 3

## 2023-12-17 MED ORDER — IOHEXOL 350 MG/ML SOLN
75.0000 mL | Freq: Once | INTRAVENOUS | Status: AC | PRN
  Administered 2023-12-17: 75 mL via INTRAVENOUS

## 2023-12-17 NOTE — ED Notes (Signed)
 EDT performed ambulatory sats. Saturation reading maintained 96% and above on RA. EDP made aware

## 2023-12-17 NOTE — ED Provider Notes (Signed)
 [facility] Provider Note    Event Date/Time   First MD Initiated Contact with Patient 12/17/23 0424     (approximate)   History   Abdominal Pain   HPI  Charles Wilkinson is a 78 y.o. male with history of hypertension, diabetes, hyperlipidemia, hypothyroidism, NASH, CKD, prior appendectomy and hernia repair who presents to the emergency department with complaints of right-sided abdominal pain, increased shortness of breath.  Patient reports he fell on 12/07/2023 and was diagnosed with a rib fracture.  States he has had increasing shortness of breath but no new chest pain.  He also reports that he has had increasing pain in the right lower abdomen with significant bruising through the abdomen that goes into the flank and into the inguinal area.  He denies any new injuries.  He is on aspirin  but no other antiplatelet or anticoagulant.  States he has been using a walker to ambulate.  States he lives in assisted living.  No nausea, vomiting, diarrhea, urinary symptoms.   History provided by patient and EMS.    Past Medical History:  Diagnosis Date   Diabetes mellitus without complication (HCC)    GERD (gastroesophageal reflux disease)    Hyperlipidemia    Hypertension    Hypothyroidism    NASH (nonalcoholic steatohepatitis)     Past Surgical History:  Procedure Laterality Date   APPENDECTOMY     HERNIA REPAIR      MEDICATIONS:  Prior to Admission medications   Medication Sig Start Date End Date Taking? Authorizing Provider  acetaminophen  (TYLENOL ) 325 MG tablet Take 2 tablets (650 mg total) by mouth every 6 (six) hours as needed for mild pain or headache (fever >/= 101). 04/10/21   Josette Ade, MD  albuterol  (VENTOLIN  HFA) 108 (90 Base) MCG/ACT inhaler Inhale 2 puffs into the lungs every 6 (six) hours as needed for wheezing or shortness of breath. 04/10/21   Josette Ade, MD  ascorbic acid  (VITAMIN C) 1000 MG tablet Take 1 tablet by mouth daily.     [provider]  aspirin  EC 81 MG EC tablet Take 1 tablet (81 mg total) by mouth daily. Swallow whole. 07/13/21   Caleen Qualia, MD  atorvastatin  (LIPITOR) 40 MG tablet Take 1 tablet (40 mg total) by mouth daily. 07/13/21   Caleen Qualia, MD  Cholecalciferol  50 MCG (2000 UT) CAPS Take 2,000 Units by mouth daily.    [provider]  ferrous sulfate  325 (65 FE) MG tablet Take 1 tablet by mouth daily with breakfast. 10/22/21 10/22/22  [provider]  furosemide  (LASIX ) 40 MG tablet Take 40 mg by mouth every Monday, Wednesday, and Friday.    [provider]  levothyroxine  (SYNTHROID ) 175 MCG tablet Take 175 mcg by mouth daily. 02/01/21   [provider]  losartan  (COZAAR ) 25 MG tablet TAKE 1 TABLET(25 MG) BY MOUTH DAILY 10/15/22   Tobb, Kardie, DO  magnesium  oxide (MAG-OX) 400 (240 Mg) MG tablet TAKE 1 TABLET BY MOUTH EVERY MORNING AND 1 TABLET AT BEDTIME 08/20/21   Tobb, Kardie, DO  metFORMIN (GLUCOPHAGE) 500 MG tablet Take 1,500 mg by mouth daily. 1,000mg  in the morning 500mg  at night 03/18/21   [provider]  Multiple Vitamin (MULTI-VITAMIN) tablet Take 1 tablet by mouth daily.    [provider]  nadolol  (CORGARD ) 20 MG tablet Take 20 mg by mouth daily.    [provider]  Omega-3 Fatty Acids (OMEGA-3 FISH OIL) 1200 MG CAPS Take 1 capsule by  mouth daily.    [provider]  oxyCODONE  (ROXICODONE ) 5 MG immediate release tablet Take 0.5 tablets (2.5 mg total) by mouth every 6 (six) hours as needed for severe pain (pain score 7-10). 12/07/23 12/06/24  Arlander Charleston, MD  pantoprazole  (PROTONIX ) 40 MG tablet Take 40 mg by mouth 2 (two) times daily. 01/02/21   [provider]  spironolactone  (ALDACTONE ) 50 MG tablet Take 50 mg by mouth daily.    [provider]  Vitamin E  268 MG (400 UNIT) CAPS Take 1 tablet by mouth daily.    [provider]  zinc  sulfate 220 (50 Zn) MG capsule Take 1 capsule (220 mg total)  by mouth daily. 04/11/21   Josette Ade, MD    Physical Exam   Triage Vital Signs: ED Triage Vitals  Encounter Vitals Group     BP 12/17/23 0424 (!) 150/85     Girls Systolic BP Percentile --      Girls Diastolic BP Percentile --      Boys Systolic BP Percentile --      Boys Diastolic BP Percentile --      Pulse Rate 12/17/23 0424 82     Resp 12/17/23 0427 17     Temp 12/17/23 0428 97.7 F (36.5 C)     Temp Source 12/17/23 0428 Oral     SpO2 12/17/23 0424 98 %     Weight 12/17/23 0425 174 lb 11.2 oz (79.2 kg)     Height 12/17/23 0425 5' 10 (1.778 m)     Head Circumference --      Peak Flow --      Pain Score 12/17/23 0424 9     Pain Loc --      Pain Education --      Exclude from Growth Chart --     Most recent vital signs: Vitals:   12/17/23 0428 12/17/23 0430  BP:  (!) 144/63  Pulse:  78  Resp:  10  Temp: 97.7 F (36.5 C)   SpO2:  98%     CONSTITUTIONAL: Alert, responds appropriately to questions.  Elderly, chronically ill-appearing HEAD: Normocephalic; atraumatic EYES: Conjunctivae clear, PERRL, EOMI ENT: normal nose; no rhinorrhea; moist mucous membranes; pharynx without lesions noted; no dental injury; no septal hematoma, no epistaxis; no facial deformity or bony tenderness NECK: Supple, no midline spinal tenderness, step-off or deformity; trachea midline CARD: RRR; S1 and S2 appreciated; no murmurs, no clicks, no rubs, no gallops RESP: Normal chest excursion without splinting or tachypnea; breath sounds clear and equal bilaterally; no wheezes, no rhonchi, no rales; no hypoxia or respiratory distress CHEST:  chest wall stable, tender over the right lower ribs, no crepitus ABD/GI: Non-distended; soft, tender in the right lower abdomen, diffuse bruising throughout the right inguinal area, right lower abdomen that goes into the right flank PELVIS:  stable, nontender to palpation BACK:  The back appears normal; no midline spinal tenderness, step-off or  deformity, bruising noted to the thoracic and lumbar paraspinal areas EXT: Normal ROM in all joints; no edema; normal capillary refill; no cyanosis, no bony tenderness or bony deformity of patient's extremities, no joint effusions, compartments are soft, extremities are warm and well-perfused, no ecchymosis SKIN: Normal color for age and race; warm NEURO: No facial asymmetry, normal speech, moving all extremities equally  ED Results / Procedures / Treatments   LABS: (all labs ordered are listed, but only abnormal results are displayed) Labs Reviewed  COMPREHENSIVE METABOLIC PANEL WITH GFR - Abnormal; Notable  for the following components:      Result Value   Sodium 130 (*)    Chloride 96 (*)    Glucose, Bld 134 (*)    BUN 35 (*)    Creatinine, Ser 1.65 (*)    Total Bilirubin 1.7 (*)    GFR, Estimated 42 (*)    All other components within normal limits  CBC - Abnormal; Notable for the following components:   WBC 3.5 (*)    RBC 4.21 (*)    Platelets 90 (*)    All other components within normal limits  URINALYSIS, ROUTINE W REFLEX MICROSCOPIC - Abnormal; Notable for the following components:   Color, Urine STRAW (*)    APPearance CLEAR (*)    Glucose, UA >=500 (*)    All other components within normal limits  RESP PANEL BY RT-PCR (RSV, FLU A&B, COVID)  RVPGX2  LIPASE, BLOOD  PROTIME-INR  TYPE AND SCREEN  TROPONIN I (HIGH SENSITIVITY)  TROPONIN I (HIGH SENSITIVITY)     EKG:  EKG Interpretation Date/Time:  Thursday December 17 2023 04:42:44 EDT Ventricular Rate:  74 PR Interval:  47 QRS Duration:  92 QT Interval:  395 QTC Calculation: 439 R Axis:   2  Text Interpretation: Sinus rhythm Short PR interval Low voltage, precordial leads Anteroseptal infarct, old Confirmed by Neomi Neptune [phone]) on 12/17/2023 4:53:32 AM          RADIOLOGY: My personal review and interpretation of imaging: CT scan shows multiple rib fractures.  I have personally reviewed all radiology  reports. CT Angio Chest PE W and/or Wo Contrast Result Date: 12/17/2023 CLINICAL DATA:  78 year old male with rib pain after a fall earlier this month, right 10th rib fracture identified on radiographs 10 days ago. Increasing pain and shortness of breath. EXAM: CT ANGIOGRAPHY CHEST WITH CONTRAST TECHNIQUE: Multidetector CT imaging of the chest was performed using the standard protocol during bolus administration of intravenous contrast. Multiplanar CT image reconstructions and MIPs were obtained to evaluate the vascular anatomy. RADIATION DOSE REDUCTION: This exam was performed according to the departmental dose-optimization program which includes automated exposure control, adjustment of the mA and/or kV according to patient size and/or use of iterative reconstruction technique. CONTRAST:  75mL OMNIPAQUE  IOHEXOL  350 MG/ML SOLN COMPARISON:  Chest and rib radiographs 12/07/2023. CT Abdomen and Pelvis today reported separately. Two-view chest radiographs 04/02/2021. FINDINGS: Cardiovascular: Adequate contrast bolus timing in the pulmonary arterial tree. Respiratory motion. This is most pronounced in the lower lobes. No pulmonary artery filling defect is identified. Calcified aortic atherosclerosis. Calcified coronary artery atherosclerosis. Normal heart size. No pericardial effusion. Mediastinum/Nodes: Negative for mediastinal hematoma, mass, lymphadenopathy. Lungs/Pleura: Small volume retained secretions in the trachea. Major airways are patent. Negative for pneumothorax. No significant pleural effusion. Bilateral lower lobe and lung base atelectasis which is platelike and enhancing greater on the right. Upper Abdomen: CT Abdomen and Pelvis today is reported separately. Musculoskeletal: Incompletely visible T1 vertebra. Superior endplate compression of T2 is mild, moderate at T4, mild at T10, mild at T11. And T12 compression fracture is moderate to severe, near vertebra plana. Several of these levels are sclerotic.  But all levels are fairly age indeterminate. The T12 compression fracture was probably visible in 2022. Lumbar spine is reported separately today. Comminuted and displaced posterior right 10th rib fracture. Comminuted and less displaced posterior right 11th rib fracture. Right lateral 10th rib also fractured and nondisplaced series 6, image 139. Partially visible comminuted fracture of the right lateral 9th rib on  image 145. Nondisplaced fracture of the right anterior 8th rib on image 139. Additional lower ribs may be visible on CT Abdomen and Pelvis today reported separately. No other acute rib fracture identified on these images. Review of the MIP images confirms the above findings. IMPRESSION: 1. Negative for acute pulmonary embolus identified, mild respiratory motion. 2. Right side rib fractures 8 through 11. Most are comminuted and mildly displaced. No associated pneumothorax, pleural effusion, or contusion. Bilateral lung base atelectasis. 3. Multilevel thoracic spine compression fractures, T12 level probably chronic and visible on 2022 radiographs. Other levels are age indeterminate. If specific therapy such as vertebroplasty is desired, noncontrast MRI or Nuclear Medicine Whole-body Bone Scan would best determine acuity. 4.  CT Abdomen and Pelvis reported separately. 5. Calcified coronary artery, Aortic Atherosclerosis (ICD10-I70.0). Electronically Signed   By: VEAR Hurst M.D.   On: 12/17/2023 06:59     PROCEDURES:  Critical Care performed: No     .1-3 Lead EKG Interpretation  Performed by: Ricardo Schubach, Josette SAILOR, DO Authorized by: Levoy Geisen, Josette SAILOR, DO     Interpretation: normal     ECG rate:  83   ECG rate assessment: normal     Rhythm: sinus rhythm     Ectopy: none     Conduction: normal       IMPRESSION / MDM / ASSESSMENT AND PLAN / ED COURSE  I reviewed the triage vital signs and the nursing notes.  Patient here with increasing shortness of breath, right-sided abdominal pain.  Had a fall  11 days ago and now has significant bruising noted to his abdomen, flank.  The patient is on the cardiac monitor to evaluate for evidence of arrhythmia and/or significant heart rate changes.   DIFFERENTIAL DIAGNOSIS (includes but not limited to):   Pneumothorax, developing pneumonia, PE given he has been more immobilized due to pain, differential also includes hematoma, bowel contusion, liver laceration, colitis, ileus, bowel obstruction, UTI, kidney stone, pyelonephritis, pancreatitis  Patient's presentation is most consistent with acute presentation with potential threat to life or bodily function.  PLAN: Will obtain labs, urine, CTA of the chest, CT of the abdomen pelvis.  Will give pain medication.  Will keep n.p.o. at this time.  EKG nonischemic.   MEDICATIONS GIVEN IN ED: Medications  fentaNYL  (SUBLIMAZE ) injection 50 mcg (50 mcg Intravenous Given 12/17/23 0458)  ondansetron  (ZOFRAN ) injection 4 mg (4 mg Intravenous Given 12/17/23 0459)  iohexol  (OMNIPAQUE ) 350 MG/ML injection 75 mL (75 mLs Intravenous Contrast Given 12/17/23 0636)     ED COURSE: First troponin negative.  Second pending.  COVID, flu and RSV negative.  Urine shows no sign of infection.  Mild hyponatremia getting IV fluids.  Stable chronic kidney disease.  Hemoglobin normal at 13.1.  Normal INR.  Mild thrombocytopenia which appears chronic.  CTA of the chest reviewed and interpreted by myself and the radiologist and shows no PE, pneumothorax, pneumonia.  Does have multiple right-sided rib fractures.  Also has multiple spinal compression fractures within the thoracic spine but had no midline spinal tenderness on exam.  CT abdomen pelvis pending.  Signed out the oncoming ED physician.   CONSULTS: Pending further workup   OUTSIDE RECORDS REVIEWED: Reviewed last PCP note July 2025.       FINAL CLINICAL IMPRESSION(S) / ED DIAGNOSES   Final diagnoses:  Right sided abdominal pain  Shortness of breath   Closed fracture of multiple ribs of right side with routine healing, subsequent encounter     Rx / DC  Orders   ED Discharge Orders     None        Note:  This document was prepared using Dragon voice recognition software and may include unintentional dictation errors.   Dru Primeau, Josette SAILOR, DO 12/17/23 [phone]

## 2023-12-17 NOTE — ED Notes (Signed)
 Caregiver at bedside

## 2023-12-17 NOTE — Evaluation (Signed)
 Physical Therapy Evaluation Patient Details Name: Charles Wilkinson MRN: [id number] DOB: 08-Mar-1946 Today's Date: 12/17/2023  History of Present Illness  Pt is a 78 y/o M admitted on 12/17/23 after presenting with c/o R sided abdominal pain, increased SOB since fall on 12/07/23. Pt in ED on 12/07/23 & found to have rib fx.  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 PMH: HTN, DM, HLD, hypothyroidism, NASH, CKD, appendectomy, hernia repair, stroke  Clinical Impression  Pt seen for PT evaluation with pt agreeable, daughter present. Prior to fall, pt was mod I with rollator, living in ILF, but has declined since the fall. On this date, pt c/o 4/10 pain in back & R ribs, has significant bruising to entire posterior side of body, & daughter reporting small sacral wound. Pt requires mod assist for sidelying>sitting EOB with HOB slightly elevated, is able to take a few steps in room with RW with impaired gait pattern as noted below. After taking a few steps pt reports that's enough for today 2/2 pain & fatigue. Recommend post acute rehab <3 hours therapy/day upon d/c.        If plan is discharge home, recommend the following: A little help with walking and/or transfers;A little help with bathing/dressing/bathroom;Assistance with cooking/housework;Assist for transportation;Help with stairs or ramp for entrance   Can travel by private vehicle   Yes    Equipment Recommendations  (defer to next venue)  Recommendations for Other Services  Rehab consult    Functional Status Assessment Patient has had a recent decline in their functional status and demonstrates the ability to make significant improvements in function in a reasonable and predictable amount of time.     Precautions / Restrictions Precautions Precautions: Fall;Back Precaution/Restrictions Comments: R rib fxs Restrictions Weight Bearing Restrictions Per Provider Order: No      Mobility  Bed Mobility Overal  bed mobility: Needs Assistance Bed Mobility: Sidelying to Sit   Sidelying to sit: HOB elevated, Mod assist       General bed mobility comments: cuing re: log rolling, assistance to move BLE off EOB    Transfers Overall transfer level: Needs assistance Equipment used: Rolling walker (2 wheels) Transfers: Sit to/from Stand Sit to Stand: Min assist           General transfer comment: sit>stand from elevated EOB with cuing re: hand placement    Ambulation/Gait Ambulation/Gait assistance: Min assist Gait Distance (Feet): 3 Feet Assistive device: Rolling walker (2 wheels) Gait Pattern/deviations: Decreased step length - right, Decreased step length - left, Decreased dorsiflexion - right, Decreased dorsiflexion - left, Decreased stride length Gait velocity: decreased     General Gait Details: decreased heel strike BLE, ambulates forwards then steps back to bed, side steps to L along EOB x 3 ft  Stairs            Wheelchair Mobility     Tilt Bed    Modified Rankin (Stroke Patients Only)       Balance Overall balance assessment: Needs assistance Sitting-balance support: Feet supported Sitting balance-Leahy Scale: Fair Sitting balance - Comments: supervision static sitting   Standing balance support: Bilateral upper extremity supported, During functional activity, Reliant on assistive device for balance Standing balance-Leahy Scale: Fair                               Pertinent Vitals/Pain Pain Assessment Pain Assessment: 0-10 Pain  Score: 4  Pain Location: back, R rib fxs Pain Descriptors / Indicators: Discomfort, Guarding Pain Intervention(s): Monitored during session, Limited activity within patient's tolerance, Patient requesting pain meds-RN notified    Home Living Family/patient expects to be discharged to:: Private residence (ILF) Living Arrangements: Alone Available Help at Discharge: Family;Personal care attendant (4 hours/day, 7  days/week) Type of Home: Independent living facility         Home Layout: One level Home Equipment: Rollator (4 wheels);Shower seat - built in;Grab bars - toilet;Grab bars - tub/shower;Hand held Programmer, systems (2 wheels)      Prior Function Prior Level of Function : Needs assist             Mobility Comments: amb with rollator prior to fall walking to dining facility ADLs Comments: prior to fall, MOD I for ADL, assist for IADL; caregiver assists 7 days a week 8am-noon; supervision for bathing     Extremity/Trunk Assessment   Upper Extremity Assessment Upper Extremity Assessment: Generalized weakness    Lower Extremity Assessment Lower Extremity Assessment: Generalized weakness    Cervical / Trunk Assessment Cervical / Trunk Assessment:  (significantly forward head (chin almost to chest), rounded shoulders, significant bruising to posterior side of body)  Communication   Communication Communication: Impaired Factors Affecting Communication: Reduced clarity of speech    Cognition Arousal: Alert Behavior During Therapy: Flat affect   PT - Cognitive impairments: No apparent impairments                         Following commands: Impaired Following commands impaired: Follows one step commands with increased time     Cueing Cueing Techniques: Verbal cues     General Comments General comments (skin integrity, edema, etc.): Difficulty managing secretions during session. C/o feeling faint during gait, reports symptoms gone once sitting down EOB. SpO2 >90% on room air. Educated pt on importance of upright sitting, OOB mobility, use of incentive spirometer (once he receives it upstairs).    Exercises     Assessment/Plan    PT Assessment Patient needs continued PT services  PT Problem List Decreased strength;Decreased coordination;Pain;Decreased range of motion;Decreased activity tolerance;Decreased balance;Decreased mobility;Decreased safety  awareness;Decreased knowledge of use of DME;Decreased knowledge of precautions;Cardiopulmonary status limiting activity       PT Treatment Interventions Balance training;DME instruction;Gait training;Neuromuscular re-education;Modalities;Functional mobility training;Therapeutic activities;Therapeutic exercise;Patient/family education    PT Goals (Current goals can be found in the Care Plan section)  Acute Rehab PT Goals Patient Stated Goal: decreased pain, get better, go to rehab PT Goal Formulation: With patient/family Time For Goal Achievement: 12/31/23 Potential to Achieve Goals: Good    Frequency Min 2X/week     Co-evaluation               AM-PAC PT 6 Clicks Mobility  Outcome Measure Help needed turning from your back to your side while in a flat bed without using bedrails?: A Little Help needed moving from lying on your back to sitting on the side of a flat bed without using bedrails?: A Lot Help needed moving to and from a bed to a chair (including a wheelchair)?: A Little Help needed standing up from a chair using your arms (e.g., wheelchair or bedside chair)?: A Little Help needed to walk in hospital room?: A Lot Help needed climbing 3-5 steps with a railing? : Total 6 Click Score: 14    End of Session   Activity Tolerance: Patient limited by fatigue;Patient  limited by pain Patient left: in bed;with call bell/phone within reach;with family/visitor present (sitting EOB) Nurse Communication: Patient requests pain meds PT Visit Diagnosis: Unsteadiness on feet (R26.81);Difficulty in walking, not elsewhere classified (R26.2);Other abnormalities of gait and mobility (R26.89);Muscle weakness (generalized) (M62.81);Pain Pain - part of body:  (back, R ribs)    Time: 8479-8467 PT Time Calculation (min) (ACUTE ONLY): 12 min   Charges:   PT Evaluation $PT Eval Low Complexity: 1 Low   PT General Charges $$ ACUTE PT VISIT: 1 Visit         Richerd Pinal, PT,  DPT 12/17/23, 3:46 PM   Richerd CHRISTELLA Pinal 12/17/2023, 3:44 PM

## 2023-12-17 NOTE — ED Notes (Signed)
 Daughter reporting pt does not take insulin  at home and is not wishing to start it in the hospital unless CBG is too elevated

## 2023-12-17 NOTE — ED Provider Notes (Signed)
 7:24 AM Assumed care for off going team.   Blood pressure 116/73, pulse 82, temperature 97.7 F (36.5 C), temperature source Oral, resp. rate 16, height 5' 10 (1.778 m), weight 79.2 kg, SpO2 97%.  See their HPI for full report but in brief pending trop/CT abd   IMPRESSION: 1. 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 Peripancreatic coarse soft tissue calcifications which might be postinflammatory nodes. Pancreatic Carcinoma not excluded, although this constellation might instead be sequelae of chronic pancreatitis.   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   3. Acute nondisplaced fractures of the posterior right 12th rib and the right L1 transverse process, in addition to the other right lower rib fractures on CTA Chest today (reported separately).   4. Moderate to severe T12 and L1 compression fractures are most likely chronic, suspected on 2022 radiographs.  IMPRESSION: 1. Negative for acute pulmonary embolus identified, mild respiratory motion.   2. Right side rib fractures 8 through 11. Most are comminuted and mildly displaced. No associated pneumothorax, pleural effusion, or contusion. Bilateral lung base atelectasis.   3. Multilevel thoracic spine compression fractures, T12 level probably chronic and visible on 2022 radiographs. Other levels are age indeterminate. If specific therapy such as vertebroplasty is desired, noncontrast MRI or Nuclear Medicine Whole-body Bone Scan would best determine acuity.   4.  CT Abdomen and Pelvis reported separately.   Discussed with family incidental findings on CT scan.  They are aware.  We discussed the L1 transverse process fracture but he has got no tenderness on this at this time.  Family states that he has independent living  and only gets 4 hours of care every day and that he is been unable to ambulate or take care of himself secondary to these rib fractures.  They are requesting placement for patient.  Given his multiple rib fractures I will discuss the hospitalist team for admission for pain control, placement.       Ernest Ronal BRAVO, MD 12/17/23 [phone]

## 2023-12-17 NOTE — H&P (Signed)
 History and Physical    Charles Wilkinson:[id number] DOB: 1945-07-06 DOA: 12/17/2023  DOS: the patient was seen and examined on 12/17/2023  PCP: Epifanio Alm SQUIBB, MD   Patient coming from: Home  I have personally briefly reviewed patient's old medical records in [facility]  Chief Complaint: Abdominal pain, RUQ  HPI: Charles Wilkinson is a pleasant 78 y.o. male with medical history significant for HTN, DM, HLD, hypothyroidism, NASH, CKD provide appendectomy and hernia repair who presented to ED complaining of right-sided abdominal pain, increased shortness of breath since he fell on 12/07/2023.  He attended ER on 12/07/2023 and diagnosed with a rib fracture.  Since then his daughter and caregiver advised me that he has some shortness of breath and abdominal pain and not able to take care of him at home.  He has a 24/7 coverage however it is increasingly difficult for him to get around at home due to abdominal pain and shortness of breath.  He and his family denied any new injuries or falls.  He is on aspirin  but not on any other anticoagulations.  He has been using walker to ambulate.  He states that he lives in a assisted living facility.  There is no fever, chills, cough, palpitations, nausea, vomiting, diarrhea, urinary symptoms.  ED Course: Upon arrival to the ED, patient is found to be hypertensive, complaining of pain, CT of the chest was done showed no PE, no pneumothorax, no pneumonia.  He has multiple right-sided rib fractures 8 through 11.  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  Hospitalist service was consulted for evaluation for admission.  Review of Systems:  ROS  All other systems negative except as noted in the HPI.  Past Medical History:  Diagnosis Date   Diabetes mellitus without complication (HCC)    GERD (gastroesophageal reflux disease)     Hyperlipidemia    Hypertension    Hypothyroidism    NASH (nonalcoholic steatohepatitis)     Past Surgical History:  Procedure Laterality Date   APPENDECTOMY     HERNIA REPAIR       reports that he has quit smoking. His smoking use included cigarettes. He has never used smokeless tobacco. He reports that he does not currently use alcohol. He reports that he does not use drugs.  No Known Allergies  Family History  Problem Relation Age of Onset   CAD Mother    Diabetes Mother     Prior to Admission medications   Medication Sig Start Date End Date Taking? Authorizing Provider  acetaminophen  (TYLENOL ) 325 MG tablet Take 2 tablets (650 mg total) by mouth every 6 (six) hours as needed for mild pain or headache (fever >/= 101). 04/10/21   Josette Ade, MD  albuterol  (VENTOLIN  HFA) 108 (90 Base) MCG/ACT inhaler Inhale 2 puffs into the lungs every 6 (six) hours as needed for wheezing or shortness of breath. 04/10/21   Josette Ade, MD  ascorbic acid  (VITAMIN C) 1000 MG tablet Take 1 tablet by mouth daily.    [provider]  aspirin  EC 81 MG EC tablet Take 1 tablet (81 mg total) by mouth daily. Swallow whole. 07/13/21   Caleen Qualia, MD  atorvastatin  (LIPITOR) 40 MG tablet Take 1 tablet (40 mg total) by mouth daily. 07/13/21   Caleen Qualia, MD  Cholecalciferol  50 MCG (2000 UT) CAPS Take 2,000 Units  by mouth daily.    [provider]  ferrous sulfate  325 (65 FE) MG tablet Take 1 tablet by mouth daily with breakfast. 10/22/21 10/22/22  [provider]  furosemide  (LASIX ) 40 MG tablet Take 40 mg by mouth every Monday, Wednesday, and Friday.    [provider]  levothyroxine  (SYNTHROID ) 175 MCG tablet Take 175 mcg by mouth daily. 02/01/21   [provider]  losartan  (COZAAR ) 25 MG tablet TAKE 1 TABLET(25 MG) BY MOUTH DAILY 10/15/22   Tobb, Kardie, DO  magnesium  oxide (MAG-OX) 400 (240 Mg) MG tablet TAKE 1 TABLET BY MOUTH EVERY MORNING AND 1 TABLET AT  BEDTIME 08/20/21   Tobb, Kardie, DO  metFORMIN (GLUCOPHAGE) 500 MG tablet Take 1,500 mg by mouth daily. 1,000mg  in the morning 500mg  at night 03/18/21   [provider]  Multiple Vitamin (MULTI-VITAMIN) tablet Take 1 tablet by mouth daily.    [provider]  nadolol  (CORGARD ) 20 MG tablet Take 20 mg by mouth daily.    [provider]  Omega-3 Fatty Acids (OMEGA-3 FISH OIL) 1200 MG CAPS Take 1 capsule by mouth daily.    [provider]  oxyCODONE  (ROXICODONE ) 5 MG immediate release tablet Take 0.5 tablets (2.5 mg total) by mouth every 6 (six) hours as needed for severe pain (pain score 7-10). 12/07/23 12/06/24  Arlander Charleston, MD  pantoprazole  (PROTONIX ) 40 MG tablet Take 40 mg by mouth 2 (two) times daily. 01/02/21   [provider]  spironolactone  (ALDACTONE ) 50 MG tablet Take 50 mg by mouth daily.    [provider]  Vitamin E  268 MG (400 UNIT) CAPS Take 1 tablet by mouth daily.    [provider]  zinc  sulfate 220 (50 Zn) MG capsule Take 1 capsule (220 mg total) by mouth daily. 04/11/21   Josette Ade, MD    Physical Exam: Vitals:   12/17/23 0715 12/17/23 0730 12/17/23 0800 12/17/23 0900  BP:  (!) 110/59 (!) 153/75 131/82  Pulse: 82 76 73 79  Resp: 16 13 15 11   Temp:   97.9 F (36.6 C)   TempSrc:   Oral   SpO2: 97% 99% 98% 98%  Weight:      Height:        Physical Exam   Constitutional: Alert, awake, calm, comfortable HEENT: Neck supple Respiratory: Clear to auscultation B/L, no wheezing, no rales.  Cardiovascular: Regular rate and rhythm, no murmurs / rubs / gallops. No extremity edema. 2+ pedal pulses. No carotid bruits.  Abdomen: Soft, no tenderness, Bowel sounds positive.  Musculoskeletal: no clubbing / cyanosis. Good ROM, no contractures. Normal muscle tone.  Skin: Groin and hip area has a bruises Neurologic: CN 2-12 grossly intact. Sensation intact, No focal deficit identified Psychiatric: Alert and oriented x 3.  Normal mood.    Labs on Admission: I have personally reviewed following labs and imaging studies  CBC: Recent Labs  Lab 12/17/23 0431  WBC 3.5*  HGB 13.1  HCT 39.1  MCV 92.9  PLT 90*   Basic Metabolic Panel: Recent Labs  Lab 12/17/23 0431  NA 130*  K 4.4  CL 96*  CO2 25  GLUCOSE 134*  BUN 35*  CREATININE 1.65*  CALCIUM  9.1   GFR: Estimated Creatinine Clearance: 37.5 mL/min (A) (by C-G formula based on SCr of 1.65 mg/dL (H)). Liver Function Tests: Recent Labs  Lab 12/17/23 0431  AST 29  ALT 30  ALKPHOS 120  BILITOT 1.7*  PROT 7.4  ALBUMIN 3.6  Recent Labs  Lab 12/17/23 0431  LIPASE 47   No results for input(s): AMMONIA in the last 168 hours. Coagulation Profile: Recent Labs  Lab 12/17/23 0450  INR 1.0   Cardiac Enzymes: Recent Labs  Lab 12/17/23 0450 12/17/23 0638  TROPONINIHS 7 7   BNP (last 3 results) Recent Labs    02/06/23 1204  BNP 126.9*   HbA1C: No results for input(s): HGBA1C in the last 72 hours. CBG: Recent Labs  Lab 12/17/23 1033  GLUCAP 150*   Lipid Profile: No results for input(s): CHOL, HDL, LDLCALC, TRIG, CHOLHDL, LDLDIRECT in the last 72 hours. Thyroid  Function Tests: No results for input(s): TSH, T4TOTAL, FREET4, T3FREE, THYROIDAB in the last 72 hours. Anemia Panel: No results for input(s): VITAMINB12, FOLATE, FERRITIN, TIBC, IRON, RETICCTPCT in the last 72 hours. Urine analysis:    Component Value Date/Time   COLORURINE STRAW (A) 12/17/2023 0431   APPEARANCEUR CLEAR (A) 12/17/2023 0431   LABSPEC 1.005 12/17/2023 0431   PHURINE 7.0 12/17/2023 0431   GLUCOSEU >=500 (A) 12/17/2023 0431   HGBUR NEGATIVE 12/17/2023 0431   BILIRUBINUR NEGATIVE 12/17/2023 0431   KETONESUR NEGATIVE 12/17/2023 0431   PROTEINUR NEGATIVE 12/17/2023 0431   NITRITE NEGATIVE 12/17/2023 0431   LEUKOCYTESUR NEGATIVE 12/17/2023 0431    Radiological Exams on Admission: I have personally reviewed  images CT ABDOMEN PELVIS W CONTRAST Result Date: 12/17/2023 CLINICAL DATA:  78 year old male with rib pain after a fall earlier this month, right 10th rib fracture identified on radiographs 10 days ago. Increasing pain and shortness of breath. EXAM: CT ABDOMEN AND PELVIS WITH CONTRAST TECHNIQUE: Multidetector CT imaging of the abdomen and pelvis was performed using the standard protocol following bolus administration of intravenous contrast. RADIATION DOSE REDUCTION: This exam was performed according to the departmental dose-optimization program which includes automated exposure control, adjustment of the mA and/or kV according to patient size and/or use of iterative reconstruction technique. CONTRAST:  75mL OMNIPAQUE  IOHEXOL  350 MG/ML SOLN COMPARISON:  CTA chest today reported separately. Prior chest radiographs in 2022. FINDINGS: Lower chest: CTA reported separately today. Stable atelectasis at the lung bases on these images. In addition to the comminuted rib fractures described on chest CTA today there is a nondisplaced fracture of posterior right 12th rib. See additional bone details below. Hepatobiliary: Cirrhotic liver with stigmata of portal venous hypertension including extensive upper abdominal varices. Extensive distal paraesophageal varices are enhancing on series 2, image 14. Nodular liver contour. No discrete liver lesion. Abnormal gallbladder with a large partially calcified 2.7 cm gallstone within the lumen. No pericholecystic inflammation. Trace perihepatic ascites Pancreas: Abnormal. Atrophied pancreatic body and tail with ductal dilatation, and dominant 3.0 x 4.7 cm cystic mass of the pancreatic head and/or uncinate (series 2, image 36 and coronal image 46. No peripancreatic lymphadenopathy, but there are coarse calcifications situated between the pancreas and the porta hepatis (the largest is 13 mm series 2, image 29) which may be postinflammatory calcified nodes. Spleen: Splenomegaly.  Perisplenic varices. No discrete splenic lesion. Adrenals/Urinary Tract: Negative. Mildly to moderately distended urinary bladder. Stomach/Bowel: Redundant large bowel with mild to moderate retained stool. No large bowel inflammation identified. Cecum on a lax mesentery located in the right upper quadrant. Decompressed terminal ileum. Appendix diminutive or absent. Nondilated small bowel. Retained food debris in the stomach. Decompressed duodenum. No pneumoperitoneum. Vascular/Lymphatic: Advanced Aortoiliac calcified atherosclerosis. Infrarenal abdominal aortic aneurysm is calcified and measures up to 3.8 cm maximum diameter on series 2, image 46. Recommend ultrasound follow-up in 3  years. Underlying generalized arterial tortuosity in the abdomen and pelvis. Major arterial structures and portal venous system appear to be patent. Superimposed extensive upper abdominal venous varices, advanced in the gastrohepatic ligament (series 2, image 20) and paraesophageal space. No lymphadenopathy identified. Reproductive: Negative; small fat containing left inguinal hernia. Other: No pelvis free fluid. Musculoskeletal: Lower thoracic spine compression fractures, severe at T12. That level, and similar moderate to severe L1 compression fracture, appear unchanged from 2022 lateral chest radiographs. Superimposed moderate levoconvex lumbar scoliosis and advanced disc and endplate degeneration with multilevel vacuum disc. Minor compression of the L3 and L4 inferior endplates, likely chronic. However, superimposed minimally displaced L1 right transverse process fracture on series 2, image 31. Other lumbar posterior elements appear intact. Sacrum, SI joints, pelvis, and proximal femurs appear intact. IMPRESSION: 1. 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 Peripancreatic coarse soft tissue calcifications which might be postinflammatory nodes.  Pancreatic Carcinoma not excluded, although this constellation might instead be sequelae of chronic pancreatitis. 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 3. Acute nondisplaced fractures of the posterior right 12th rib and the right L1 transverse process, in addition to the other right lower rib fractures on CTA Chest today (reported separately). 4. Moderate to severe T12 and L1 compression fractures are most likely chronic, suspected on 2022 radiographs. 5. Aortic Atherosclerosis (ICD10-I70.0). Infrarenal Abdominal Aortic Aneurysm up to 3.8 cm. Recommend follow-up ultrasound every 3 years. (Ref.: J Vasc Surg. 2018; 67:2-77 and J Am Coll Radiol 2013;10(10):789-794.) Aortic aneurysm NOS (ICD10-I71.9). Electronically Signed   By: VEAR Hurst M.D.   On: 12/17/2023 07:11   CT Angio Chest PE W and/or Wo Contrast Result Date: 12/17/2023 CLINICAL DATA:  78 year old male with rib pain after a fall earlier this month, right 10th rib fracture identified on radiographs 10 days ago. Increasing pain and shortness of breath. EXAM: CT ANGIOGRAPHY CHEST WITH CONTRAST TECHNIQUE: Multidetector CT imaging of the chest was performed using the standard protocol during bolus administration of intravenous contrast. Multiplanar CT image reconstructions and MIPs were obtained to evaluate the vascular anatomy. RADIATION DOSE REDUCTION: This exam was performed according to the departmental dose-optimization program which includes automated exposure control, adjustment of the mA and/or kV according to patient size and/or use of iterative reconstruction technique. CONTRAST:  75mL OMNIPAQUE  IOHEXOL  350 MG/ML SOLN COMPARISON:  Chest and rib radiographs 12/07/2023. CT Abdomen and Pelvis today reported separately. Two-view chest radiographs 04/02/2021. FINDINGS: Cardiovascular: Adequate contrast bolus timing in the pulmonary arterial tree.  Respiratory motion. This is most pronounced in the lower lobes. No pulmonary artery filling defect is identified. Calcified aortic atherosclerosis. Calcified coronary artery atherosclerosis. Normal heart size. No pericardial effusion. Mediastinum/Nodes: Negative for mediastinal hematoma, mass, lymphadenopathy. Lungs/Pleura: Small volume retained secretions in the trachea. Major airways are patent. Negative for pneumothorax. No significant pleural effusion. Bilateral lower lobe and lung base atelectasis which is platelike and enhancing greater on the right. Upper Abdomen: CT Abdomen and Pelvis today is reported separately. Musculoskeletal: Incompletely visible T1 vertebra. Superior endplate compression of T2 is mild, moderate at T4, mild at T10, mild at T11. And T12 compression fracture is moderate to severe, near vertebra plana. Several of these levels are sclerotic. But all levels are fairly age indeterminate. The T12 compression fracture was probably visible in 2022. Lumbar spine is reported separately today. Comminuted and displaced posterior right 10th rib fracture.  Comminuted and less displaced posterior right 11th rib fracture. Right lateral 10th rib also fractured and nondisplaced series 6, image 139. Partially visible comminuted fracture of the right lateral 9th rib on image 145. Nondisplaced fracture of the right anterior 8th rib on image 139. Additional lower ribs may be visible on CT Abdomen and Pelvis today reported separately. No other acute rib fracture identified on these images. Review of the MIP images confirms the above findings. IMPRESSION: 1. Negative for acute pulmonary embolus identified, mild respiratory motion. 2. Right side rib fractures 8 through 11. Most are comminuted and mildly displaced. No associated pneumothorax, pleural effusion, or contusion. Bilateral lung base atelectasis. 3. Multilevel thoracic spine compression fractures, T12 level probably chronic and visible on 2022  radiographs. Other levels are age indeterminate. If specific therapy such as vertebroplasty is desired, noncontrast MRI or Nuclear Medicine Whole-body Bone Scan would best determine acuity. 4.  CT Abdomen and Pelvis reported separately. 5. Calcified coronary artery, Aortic Atherosclerosis (ICD10-I70.0). Electronically Signed   By: VEAR Hurst M.D.   On: 12/17/2023 06:59    EKG: My personal interpretation of EKG shows: Sinus rhythm at 74 bpm    Assessment/Plan Principal Problem:   Fall Active Problems:   Stroke (HCC)   Hyperlipidemia   Type 2 diabetes mellitus with hyperlipidemia (HCC)   Essential hypertension   NASH (nonalcoholic steatohepatitis)   Pancreatic mass   Closed rib fracture    Assessment and Plan: 78 year old male/PMH of diabetes, HTN, NASH, HLD, history of stroke now with a new pancreatic mass and right-sided 4 rib fractures and L1 compression fracture who was brought in from home due to abdominal pain and unable to function at home.  1.  Fall/abdominal pain/chest pain/back pain - Patient had a fall 11 days ago at home. - Patient was evaluated and discharged home. - Patient is not able to function at home due to abdominal and back pain. - He will be admitted to hospital as inpatient for pain control and rehabilitation. - He is at high risk for pneumonia and other complications - Will give him incentive spirometry, pain control and physical and Occupational Therapy - There is no obvious pneumonia, fever or signs of sepsis.  2.  Type 2 diabetes - Will continue insulin  sliding scale and hold his oral hypoglycemic medication at this point. - Diabetic diet  3.  HTN/HLD/stroke - Will continue his home medications - Supportive care  4.  Pancreatic cystic mass - May need workup - I will check alpha-fetoprotein and CA 19-9  5.  Deconditioning/fall - As mentioned above pain control, PT/OT  6.  History of compression fracture, now with L1 new fracture - Pain control -  Physical therapy - Supportive care    DVT prophylaxis: Lovenox  Code Status: Full Code Family Communication: Patient's daughter and caregiver at bedside Disposition Plan: Rehabilitation/skilled Consults called: PT/OT Admission status: Inpatient, Med-Surg   Nena Rebel, MD [facility] 12/17/2023, 10:38 AM

## 2023-12-17 NOTE — ED Notes (Signed)
 Pt sitting up in bed, family at bedside, pt states that he has rib pain, pt requests pain med, pain med given, pt awaits transport.

## 2023-12-17 NOTE — ED Triage Notes (Addendum)
 Patient arrives via EMS from [facility] C/O right upper quadrant pain that began three hours ago. Patient denies any nausea, vomiting, diarrhea, or urinary symptoms at this time.

## 2023-12-17 NOTE — Evaluation (Signed)
 Occupational Therapy Evaluation Patient Details Name: Charles Wilkinson MRN: [id number] DOB: July 24, 1945 Today's Date: 12/17/2023   History of Present Illness   Pt is a 78 year old male presented to ED complaining of right-sided abdominal pain, increased shortness of breath since he fell on 12/07/2023; now a new pancreatic mass and right-sided 4 rib fractures and L1 compression fracture    PMH significant for HTN, DM, HLD, hypothyroidism, NASH, CKD provide appendectomy and hernia repair     Clinical Impressions Chart reviewed to date, pt greeted in ED stretcher, oriented x4, increased time for processing/direction following. He is noted to have difficulties managing his secretions throughout, pt daughter reports he is starting to see neurology thorough the [place]. PTA pt lives in ILF, was MOD I for ADLs with assist for IADLs from an aid 4 hrs per day, and amb with rollator prior to fall. Now, he requires assist for ADL/IADL, was amb very limited distances. Pt is performing ADL/functional mobility below PLOF, please see further details below.He will benefit from skilled OT to address functional deficits and to facilitate optimal ADL/functional mobility performance. Pt is left in stretcher, all needs met. OT will follow.      If plan is discharge home, recommend the following:   A lot of help with walking and/or transfers;A lot of help with bathing/dressing/bathroom     Functional Status Assessment   Patient has had a recent decline in their functional status and demonstrates the ability to make significant improvements in function in a reasonable and predictable amount of time.     Equipment Recommendations   Other (comment) (defer to next venue of care)     Recommendations for Other Services         Precautions/Restrictions   Precautions Precautions: Fall;Back Precaution/Restrictions Comments: R rib fxs Restrictions Weight Bearing Restrictions Per Provider Order: No     Mobility  Bed Mobility Overal bed mobility: Needs Assistance Bed Mobility: Sidelying to Sit, Rolling, Sit to Sidelying Rolling: Max assist Sidelying to sit: Max assist, HOB elevated     Sit to sidelying: Max assist, HOB elevated      Transfers Overall transfer level: Needs assistance Equipment used: Rolling walker (2 wheels) Transfers: Sit to/from Stand Sit to Stand: Mod assist 2 attempts     Attempted lateral steps, MAX A with RW              Balance Overall balance assessment: Needs assistance Sitting-balance support: Feet supported Sitting balance-Leahy Scale: Fair     Standing balance support: Bilateral upper extremity supported, During functional activity, Reliant on assistive device for balance Standing balance-Leahy Scale: Fair                             ADL either performed or assessed with clinical judgement   ADL Overall ADL's : Needs assistance/impaired     Grooming: Minimal assistance;Sitting               Lower Body Dressing: Maximal assistance Lower Body Dressing Details (indicate cue type and reason): donn shoes     Toileting- Clothing Manipulation and Hygiene: Minimal assistance Toileting - Clothing Manipulation Details (indicate cue type and reason): standing using urinal             Vision Baseline Vision/History: 1 Wears glasses Patient Visual Report: No change from baseline       Perception         Praxis  Pertinent Vitals/Pain Pain Assessment Pain Assessment: 0-10 Pain Score: 4  Pain Location: back, R rib fxs Pain Descriptors / Indicators: Discomfort, Guarding Pain Intervention(s): Monitored during session, Repositioned     Extremity/Trunk Assessment Upper Extremity Assessment Upper Extremity Assessment: Generalized weakness   Lower Extremity Assessment Lower Extremity Assessment: Generalized weakness;LLE deficits/detail LLE Deficits / Details: pt 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   Cervical / Trunk Assessment Cervical / Trunk Assessment:  (significantly forward head (chin almost to chest))   Communication Communication Communication: Impaired Factors Affecting Communication: Reduced clarity of speech (also increased secretions)   Cognition Arousal: Alert Behavior During Therapy: Flat affect Cognition: Cognition impaired           Executive functioning impairment (select all impairments): Problem solving, Reasoning                   Following commands: Impaired Following commands impaired: Follows one step commands with increased time     Cueing  General Comments   Cueing Techniques: Verbal cues  Difficulty managing secretions during session. C/o feeling faint during gait, reports symptoms gone once sitting down EOB. SpO2 >90% on room air. Educated pt on importance of upright sitting, OOB mobility, use of incentive spirometer (once he receives it upstairs).   Exercises Other Exercises Other Exercises: edu pt/daugther re role of OT, role of rehab, discharge recommendation   Shoulder Instructions      Home Living Family/patient expects to be discharged to:: Private residence Living Arrangements: Alone Available Help at Discharge: Family;Personal care attendant (4 hours/day, 7 days/week) Type of Home: Independent living facility       Home Layout: One level     Bathroom Shower/Tub: Tub/shower unit         Home Equipment: Rollator (4 wheels);Shower seat - built in;Grab bars - toilet;Grab bars - tub/shower;Hand held Programmer, systems (2 wheels)          Prior Functioning/Environment Prior Level of Function : Needs assist             Mobility Comments: amb with rollator prior to fall walking to dining facility ADLs Comments: prior to fall, MOD I for ADL, assist for IADL; caregiver assists 7 days a week 8am-noon; supervision for bathing    OT Problem List: Decreased strength;Impaired balance  (sitting and/or standing);Decreased activity tolerance;Decreased knowledge of use of DME or AE;Decreased safety awareness   OT Treatment/Interventions: Self-care/ADL training;Therapeutic exercise;Patient/family education;Balance training;DME and/or AE instruction;Therapeutic activities;Energy conservation      OT Goals(Current goals can be found in the care plan section)   Acute Rehab OT Goals Patient Stated Goal: rehab OT Goal Formulation: With patient/family Time For Goal Achievement: 12/31/23 Potential to Achieve Goals: Good ADL Goals Pt Will Perform Grooming: with modified independence;sitting;standing Pt Will Perform Lower Body Dressing: with modified independence;sitting/lateral leans;sit to/from stand Pt Will Transfer to Toilet: with modified independence;ambulating Pt Will Perform Toileting - Clothing Manipulation and hygiene: with modified independence;sit to/from stand;sitting/lateral leans   OT Frequency:  Min 2X/week    Co-evaluation              AM-PAC OT 6 Clicks Daily Activity     Outcome Measure Help from another person eating meals?: A Little Help from another person taking care of personal grooming?: A Little Help from another person toileting, which includes using toliet, bedpan, or urinal?: A Lot Help from another person bathing (including washing, rinsing, drying)?: A Lot Help from another person  to put on and taking off regular upper body clothing?: A Little Help from another person to put on and taking off regular lower body clothing?: A Lot 6 Click Score: 15   End of Session Equipment Utilized During Treatment: Rolling walker (2 wheels) Nurse Communication: Mobility status;Patient requests pain meds  Activity Tolerance: Patient tolerated treatment well Patient left: in bed;with call bell/phone within reach;with family/visitor present  OT Visit Diagnosis: Other abnormalities of gait and mobility (R26.89);Muscle weakness (generalized)  (M62.81);History of falling (Z91.81)                Time: 8590-8554 OT Time Calculation (min): 36 min Charges:  OT General Charges $OT Visit: 1 Visit OT Evaluation $OT Eval Moderate Complexity: 1 Mod  Therisa Sheffield, OTD OTR/L  12/17/23, 4:36 PM

## 2023-12-18 DIAGNOSIS — W19XXXA Unspecified fall, initial encounter: Secondary | ICD-10-CM | POA: Diagnosis not present

## 2023-12-18 DIAGNOSIS — S2241XA Multiple fractures of ribs, right side, initial encounter for closed fracture: Secondary | ICD-10-CM

## 2023-12-18 DIAGNOSIS — G20A1 Parkinson's disease without dyskinesia, without mention of fluctuations: Secondary | ICD-10-CM | POA: Diagnosis present

## 2023-12-18 DIAGNOSIS — L8990 Pressure ulcer of unspecified site, unspecified stage: Secondary | ICD-10-CM | POA: Insufficient documentation

## 2023-12-18 DIAGNOSIS — S2249XA Multiple fractures of ribs, unspecified side, initial encounter for closed fracture: Secondary | ICD-10-CM

## 2023-12-18 LAB — COMPREHENSIVE METABOLIC PANEL WITH GFR
ALT: 27 U/L (ref 0–44)
AST: 24 U/L (ref 15–41)
Albumin: 3.2 g/dL — ABNORMAL LOW (ref 3.5–5.0)
Alkaline Phosphatase: 90 U/L (ref 38–126)
Anion gap: 6 (ref 5–15)
BUN: 33 mg/dL — ABNORMAL HIGH (ref 8–23)
CO2: 24 mmol/L (ref 22–32)
Calcium: 8.8 mg/dL — ABNORMAL LOW (ref 8.9–10.3)
Chloride: 100 mmol/L (ref 98–111)
Creatinine, Ser: 1.52 mg/dL — ABNORMAL HIGH (ref 0.61–1.24)
GFR, Estimated: 46 mL/min — ABNORMAL LOW (ref >60)
Glucose, Bld: 113 mg/dL — ABNORMAL HIGH (ref 70–99)
Potassium: 4.2 mmol/L (ref 3.5–5.1)
Sodium: 130 mmol/L — ABNORMAL LOW (ref 135–145)
Total Bilirubin: 2.1 mg/dL — ABNORMAL HIGH (ref 0.0–1.2)
Total Protein: 6.3 g/dL — ABNORMAL LOW (ref 6.5–8.1)

## 2023-12-18 LAB — CBC
HCT: 35.4 % — ABNORMAL LOW (ref 39.0–52.0)
Hemoglobin: 12.4 g/dL — ABNORMAL LOW (ref 13.0–17.0)
MCH: 31.8 pg (ref 26.0–34.0)
MCHC: 35 g/dL (ref 30.0–36.0)
MCV: 90.8 fL (ref 80.0–100.0)
Platelets: 82 K/uL — ABNORMAL LOW (ref 150–400)
RBC: 3.9 MIL/uL — ABNORMAL LOW (ref 4.22–5.81)
RDW: 14.5 % (ref 11.5–15.5)
WBC: 3 K/uL — ABNORMAL LOW (ref 4.0–10.5)
nRBC: 0 % (ref 0.0–0.2)

## 2023-12-18 LAB — PROTIME-INR
INR: 1.1 (ref 0.8–1.2)
Prothrombin Time: 14.8 s (ref 11.4–15.2)

## 2023-12-18 LAB — GLUCOSE, CAPILLARY
Glucose-Capillary: 102 mg/dL — ABNORMAL HIGH (ref 70–99)
Glucose-Capillary: 195 mg/dL — ABNORMAL HIGH (ref 70–99)
Glucose-Capillary: 120 mg/dL — ABNORMAL HIGH (ref 70–99)
Glucose-Capillary: 174 mg/dL — ABNORMAL HIGH (ref 70–99)

## 2023-12-18 MED ORDER — ACETAMINOPHEN 650 MG RE SUPP: 325.0000 mg | Freq: Four times a day (QID) | RECTAL | Status: DC | PRN

## 2023-12-18 MED ORDER — ACETAMINOPHEN 500 MG PO TABS
500.0000 mg | ORAL_TABLET | Freq: Four times a day (QID) | ORAL | Status: DC | PRN
  Administered 2023-12-22 – 2023-12-24 (×3): 500 mg via ORAL
  Filled 2023-12-18 (×3): qty 1

## 2023-12-18 MED ORDER — HYDROMORPHONE HCL 2 MG PO TABS
1.0000 mg | ORAL_TABLET | ORAL | Status: DC | PRN
  Administered 2023-12-18: 2 mg via ORAL
  Filled 2023-12-18: qty 1

## 2023-12-18 MED ORDER — POLYETHYLENE GLYCOL 3350 17 G PO PACK
34.0000 g | PACK | Freq: Every day | ORAL | Status: DC
  Administered 2023-12-18 – 2023-12-23 (×6): 34 g via ORAL
  Filled 2023-12-18 (×7): qty 2

## 2023-12-18 MED ORDER — LOSARTAN POTASSIUM 25 MG PO TABS
25.0000 mg | ORAL_TABLET | Freq: Every day | ORAL | Status: DC
  Administered 2023-12-18 – 2023-12-21 (×4): 25 mg via ORAL
  Filled 2023-12-18 (×4): qty 1

## 2023-12-18 MED ORDER — TRAMADOL HCL 50 MG PO TABS
50.0000 mg | ORAL_TABLET | Freq: Two times a day (BID) | ORAL | Status: DC
  Administered 2023-12-18 – 2023-12-21 (×6): 50 mg via ORAL
  Filled 2023-12-18 (×6): qty 1

## 2023-12-18 MED ORDER — OXYCODONE HCL 5 MG PO TABS
2.5000 mg | ORAL_TABLET | Freq: Two times a day (BID) | ORAL | Status: DC
  Administered 2023-12-18 – 2023-12-21 (×6): 2.5 mg via ORAL
  Filled 2023-12-18 (×6): qty 1

## 2023-12-18 MED ORDER — ACETAMINOPHEN 500 MG PO TABS
500.0000 mg | ORAL_TABLET | Freq: Two times a day (BID) | ORAL | Status: DC
  Administered 2023-12-18 – 2023-12-24 (×12): 500 mg via ORAL
  Filled 2023-12-18 (×12): qty 1

## 2023-12-18 MED ORDER — SPIRONOLACTONE 25 MG PO TABS
25.0000 mg | ORAL_TABLET | Freq: Every day | ORAL | Status: DC
Start: 2023-12-18 — End: 2023-12-19
  Administered 2023-12-18 – 2023-12-19 (×2): 25 mg via ORAL
  Filled 2023-12-18 (×2): qty 1

## 2023-12-18 NOTE — Evaluation (Addendum)
 Clinical/Bedside Swallow Evaluation Patient Details  Name: Glenmore Karl MRN: [id number] Date of Birth: 1946-04-02  Today's Date: 12/18/2023 Time: SLP Start Time (ACUTE ONLY): 1108 SLP Stop Time (ACUTE ONLY): 1150 SLP Time Calculation (min) (ACUTE ONLY): 42 min  Past Medical History:  Past Medical History:  Diagnosis Date   Diabetes mellitus without complication (HCC)    GERD (gastroesophageal reflux disease)    Hyperlipidemia    Hypertension    Hypothyroidism    NASH (nonalcoholic steatohepatitis)    Past Surgical History:  Past Surgical History:  Procedure Laterality Date   APPENDECTOMY     HERNIA REPAIR     HPI:  Yazid Pop is a pleasant 78 y.o. male with medical history significant for HTN, DMII, HLD, hypothyroidism, NASH, CKD, appendectomy, hernia repair, history of Parksinson's disease and prior CVA. Recent fall 12/07/2023 with increased SOB and right side abdominal pain since then impacting his ability to complete self care in independent living facility with hired help. In ED on 9/1 noted to have rib fracture. On this admission, noted to have new pancreatic mass, 4 right side rib fractures (8-11) and L1 compression fracture. Imaging negative for pneumonia, hemothorax or pneumothorax.   Assessment / Plan / Recommendation  Clinical Impression  Mr. Gatt presents with a suspected overall functional oropharyngeal swallow which appears safe and efficient to maintain oral nutrition/hydration. No overt clinical indicators suggestive of aspiration observed across consistencies. Currently limited by pain s/p rib fractures impacting self feeding. Will benefit from setup assistance and supplemental feeding assistance pending pain levels. Additionally, suspect drowsiness may increase aspiration risk; however, anticipate acute resolution as pain dissipates.  No further acute dysphagia intervention indicated and will delist. Patient and hired caregiver educated and denied additional educational  needs.  SLP Visit Diagnosis: Dysphagia, unspecified (R13.10)    Aspiration Risk  Mild aspiration risk (acute risk d/t acute drowsiness from pain medication and efficiency concerns d/t supplemental feed assist related to pain - anticiapte self resolution with time)    Diet Recommendation Regular;Thin liquid    Liquid Administration via: Cup;Straw Medication Administration: Whole meds with liquid (one at a time) Supervision: Patient able to self feed;Comment (may be limited at present d/t pain s/p rib fractures and may need supplemental assist) Compensations: Small sips/bites Postural Changes: Seated upright at 90 degrees    Other  Recommendations Oral Care Recommendations: Oral care BID     Assistance Recommended at Discharge  No further acute SLP needs  Functional Status Assessment Patient has not had a recent decline in their functional status  Frequency and Duration  No acute SLP services indicated. Made aware that given history of Parkinson's disease and prior CVA continued monitoring of swallow function should be managed and to reach out to primary care physician should changes occur in the future.           Prognosis Prognosis for improved oropharyngeal function: Good      Swallow Study   General Denied dysphagia prior to admission. Reported good appetite without avoidance of specific food/liquid textures. Reported weight to be stable and no recurrent pulmonary process. Caregiver denied concerns.   Oral/Motor/Sensory Function Overall Oral Motor/Sensory Function: Within functional limits   Ice Chips   Not assessed  Thin Liquid Thin Liquid: Within functional limits Presentation: Straw    Nectar Thick   Not assessed  Honey Thick   Not assessed  Puree Puree: Within functional limits Presentation: Spoon   Solid    Rosaline Boas, MA, CCC-SLP [facility]  Acute Rehabilitation  Solid: Within functional limits Presentation: Self Fed      Rosaline CHRISTELLA Boas 12/18/2023,12:26 PM

## 2023-12-18 NOTE — Progress Notes (Signed)
 Pt was educated on purpose and proper use of incentive spirometer and demonstrate correct use. Pt was able to perform 10 reps reaching 1750 mL.

## 2023-12-18 NOTE — Progress Notes (Signed)
 SLP Cancellation Note  Patient Details Name: Charles Wilkinson MRN: [id number] DOB: [birth year]   Cancelled treatment:       Reason Eval/Treat Not Completed: Patient at procedure or test/unavailable  Pt not available d/t personal care needs with NT. Will attempt at next available time.    Norris Brumbach 12/18/2023, 8:59 AM

## 2023-12-18 NOTE — Care Management Obs Status (Signed)
 MEDICARE OBSERVATION STATUS NOTIFICATION   Patient Details  Name: Charles Wilkinson MRN: [id number] Date of Birth: [birth year]   Medicare Observation Status Notification Given:  Yes    Racheal LITTIE Schimke, RN 12/18/2023, 9:38 AM

## 2023-12-18 NOTE — Progress Notes (Signed)
 PROGRESS NOTE    Charles Wilkinson  FMW:[id number] DOB: [birth year] DOA: 12/17/2023 PCP: Epifanio Alm SQUIBB, MD    Brief Narrative:   Charles Wilkinson is a pleasant 78 y.o. male with medical history significant for HTN, DM, HLD, hypothyroidism, NASH, CKD provide appendectomy and hernia repair who presented to ED complaining of right-sided abdominal pain, increased shortness of breath since he fell on 12/07/2023.  He attended ER on 12/07/2023 and diagnosed with a rib fracture.  Since then his daughter and caregiver advised me that he has some shortness of breath and abdominal pain and not able to take care of him at home.  He has a 24/7 coverage however it is increasingly difficult for him to get around at home due to abdominal pain and shortness of breath.  He and his family denied any new injuries or falls.  He is on aspirin  but not on any other anticoagulations.  He has been using walker to ambulate.  He states that he lives in a assisted living facility.  There is no fever, chills, cough, palpitations, nausea, vomiting, diarrhea, urinary symptoms.     Assessment & Plan:   Principal Problem:   Fall Active Problems:   Stroke (HCC)   Hyperlipidemia   Stage 3a chronic kidney disease (HCC)   Type 2 diabetes mellitus with hyperlipidemia (HCC)   Essential hypertension   NASH (nonalcoholic steatohepatitis)   Hypothyroidism   Pancreatic mass   Closed rib fracture   Pressure injury of skin   Parkinson's disease (HCC)  # Right 8-12 rib fractures No pneumonia, hemothorax, pneumothorax. 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 Prior imaging not available for comparison. LFTs normal. Discussed with Dr. Therisa of GI, he advises outpatient GI f/u (will need referral or  to establish with GI through the [facility]). Outpatient provider will need to attempt to obtain prior imaging for comparison. If sufficient concern for malignancy would likely need EUS and biopsy. Admitter has ordered ca 19-9 and AFP.  - outpatient GI f/u - monitor LFTs here  # T2DM Euglycemic - SSI for now - home jardiance, glipizide, januvia, metformin on hold  # Parkinson's disease Doesn't appear to be on any specific agents  # NASH cirrhosis # Thrombocytopenia With evidence splenomegaly and abdominal varices on CT imaging. Compensated. With thrombocytopenia. INR wnl, LFTs wnl - home spiro, lasix   # Hyponatremia Mild and chronic - trend for now  # CKD 3a Stable - monitor  # History CVA - home asa, not on statin at home  # HTN Bp controlled - home losartan , spironolactone , lasix   # Hypothyroid - home synthroid    DVT prophylaxis: lovenox  Code Status: full Family Communication: daughter updated telephonically 9/12  Level of care: Med-Surg Status is: Observation    Consultants:  none  Procedures: none  Antimicrobials:  none    Subjective: Reports 3/10 right rib pain otherwise stable  Objective: Vitals:   12/17/23 1543 12/17/23 1612 12/18/23 0427 12/18/23 0739  BP: 109/64 122/60 126/71 126/66  Pulse: 70 66 65 67  Resp: 18 16 18 16   Temp: 97.8 F (36.6 C) 97.8 F (36.6 C) 98.2 F (36.8 C) 98.1 F (36.7 C)  TempSrc: Oral Oral  Oral  SpO2: 99% 96% 97% 97%  Weight:  Height:        Intake/Output Summary (Last 24 hours) at 12/18/2023 0845 Last data filed at 12/17/2023 2305 Gross per 24 hour  Intake --  Output 770 ml  Net -770 ml   Filed Weights   12/17/23 0425  Weight: 79.2 kg    Examination:  General exam: Appears calm and comfortable  Respiratory system: Clear to auscultation save for rales at bases Cardiovascular system: S1 & S2 heard, RRR.   Gastrointestinal system: Abdomen is nondistended, soft and nontender.   Central nervous  system: Alert and oriented. Non-focal exam Extremities: warm, trace LE edema Skin: No rashes, lesions or ulcers Psychiatry: Judgement and insight appear normal. Mood & affect appropriate.     Data Reviewed: I have personally reviewed following labs and imaging studies  CBC: Recent Labs  Lab 12/17/23 0431 12/18/23 0556  WBC 3.5* 3.0*  HGB 13.1 12.4*  HCT 39.1 35.4*  MCV 92.9 90.8  PLT 90* 82*   Basic Metabolic Panel: Recent Labs  Lab 12/17/23 0431 12/18/23 0556  NA 130* 130*  K 4.4 4.2  CL 96* 100  CO2 25 24  GLUCOSE 134* 113*  BUN 35* 33*  CREATININE 1.65* 1.52*  CALCIUM  9.1 8.8*   GFR: Estimated Creatinine Clearance: 40.7 mL/min (A) (by C-G formula based on SCr of 1.52 mg/dL (H)). Liver Function Tests: Recent Labs  Lab 12/17/23 0431 12/18/23 0556  AST 29 24  ALT 30 27  ALKPHOS 120 90  BILITOT 1.7* 2.1*  PROT 7.4 6.3*  ALBUMIN 3.6 3.2*   Recent Labs  Lab 12/17/23 0431  LIPASE 47   No results for input(s): AMMONIA in the last 168 hours. Coagulation Profile: Recent Labs  Lab 12/17/23 0450 12/18/23 0556  INR 1.0 1.1   Cardiac Enzymes: No results for input(s): CKTOTAL, CKMB, CKMBINDEX, TROPONINI in the last 168 hours. BNP (last 3 results) No results for input(s): PROBNP in the last 8760 hours. HbA1C: Recent Labs    12/17/23 0431  HGBA1C 6.3*   CBG: Recent Labs  Lab 12/17/23 1033 12/17/23 2007 12/18/23 0740  GLUCAP 150* 118* 102*   Lipid Profile: No results for input(s): CHOL, HDL, LDLCALC, TRIG, CHOLHDL, LDLDIRECT in the last 72 hours. Thyroid  Function Tests: No results for input(s): TSH, T4TOTAL, FREET4, T3FREE, THYROIDAB in the last 72 hours. Anemia Panel: No results for input(s): VITAMINB12, FOLATE, FERRITIN, TIBC, IRON, RETICCTPCT in the last 72 hours. Urine analysis:    Component Value Date/Time   COLORURINE STRAW (A) 12/17/2023 0431   APPEARANCEUR CLEAR (A) 12/17/2023 0431    LABSPEC 1.005 12/17/2023 0431   PHURINE 7.0 12/17/2023 0431   GLUCOSEU >=500 (A) 12/17/2023 0431   HGBUR NEGATIVE 12/17/2023 0431   BILIRUBINUR NEGATIVE 12/17/2023 0431   KETONESUR NEGATIVE 12/17/2023 0431   PROTEINUR NEGATIVE 12/17/2023 0431   NITRITE NEGATIVE 12/17/2023 0431   LEUKOCYTESUR NEGATIVE 12/17/2023 0431   Sepsis Labs: @LABRCNTIP (procalcitonin:4,lacticidven:4)  ) Recent Results (from the past 240 hours)  Resp panel by RT-PCR (RSV, Flu A&B, Covid) Anterior Nasal Swab     Status: None   Collection Time: 12/17/23  4:58 AM   Specimen: Anterior Nasal Swab  Result Value Ref Range Status   SARS Coronavirus 2 by RT PCR NEGATIVE NEGATIVE Final    Comment: (NOTE) SARS-CoV-2 target nucleic acids are NOT DETECTED.  The SARS-CoV-2 RNA is generally detectable in upper respiratory specimens during the acute phase of infection. The lowest concentration of SARS-CoV-2 viral copies this assay can detect is 138 copies/mL. A negative  result does not preclude SARS-Cov-2 infection and should not be used as the sole basis for treatment or other patient management decisions. A negative result may occur with  improper specimen collection/handling, submission of specimen other than nasopharyngeal swab, presence of viral mutation(s) within the areas targeted by this assay, and inadequate number of viral copies(<138 copies/mL). A negative result must be combined with clinical observations, patient history, and epidemiological information. The expected result is Negative.  Fact Sheet for Patients:  BloggerCourse.com  Fact Sheet for Healthcare Providers:  SeriousBroker.it  This test is no t yet approved or cleared by the [place]  FDA and  has been authorized for detection and/or diagnosis of SARS-CoV-2 by FDA under an Emergency Use Authorization (EUA). This EUA will remain  in effect (meaning this test can be used) for the duration of  the COVID-19 declaration under Section 564(b)(1) of the Act, 21 U.S.C.section 360bbb-3(b)(1), unless the authorization is terminated  or revoked sooner.       Influenza A by PCR NEGATIVE NEGATIVE Final   Influenza B by PCR NEGATIVE NEGATIVE Final    Comment: (NOTE) The Xpert Xpress SARS-CoV-2/FLU/RSV plus assay is intended as an aid in the diagnosis of influenza from Nasopharyngeal swab specimens and should not be used as a sole basis for treatment. Nasal washings and aspirates are unacceptable for Xpert Xpress SARS-CoV-2/FLU/RSV testing.  Fact Sheet for Patients: BloggerCourse.com  Fact Sheet for Healthcare Providers: SeriousBroker.it  This test is not yet approved or cleared by the [place]  FDA and has been authorized for detection and/or diagnosis of SARS-CoV-2 by FDA under an Emergency Use Authorization (EUA). This EUA will remain in effect (meaning this test can be used) for the duration of the COVID-19 declaration under Section 564(b)(1) of the Act, 21 U.S.C. section 360bbb-3(b)(1), unless the authorization is terminated or revoked.     Resp Syncytial Virus by PCR NEGATIVE NEGATIVE Final    Comment: (NOTE) Fact Sheet for Patients: BloggerCourse.com  Fact Sheet for Healthcare Providers: SeriousBroker.it  This test is not yet approved or cleared by the [place]  FDA and has been authorized for detection and/or diagnosis of SARS-CoV-2 by FDA under an Emergency Use Authorization (EUA). This EUA will remain in effect (meaning this test can be used) for the duration of the COVID-19 declaration under Section 564(b)(1) of the Act, 21 U.S.C. section 360bbb-3(b)(1), unless the authorization is terminated or revoked.  Performed at [facility], [address], [place], [place] [address]          Radiology Studies: CT ABDOMEN PELVIS W CONTRAST Result  Date: 12/17/2023 CLINICAL DATA:  78 year old male with rib pain after a fall earlier this month, right 10th rib fracture identified on radiographs 10 days ago. Increasing pain and shortness of breath. EXAM: CT ABDOMEN AND PELVIS WITH CONTRAST TECHNIQUE: Multidetector CT imaging of the abdomen and pelvis was performed using the standard protocol following bolus administration of intravenous contrast. RADIATION DOSE REDUCTION: This exam was performed according to the departmental dose-optimization program which includes automated exposure control, adjustment of the mA and/or kV according to patient size and/or use of iterative reconstruction technique. CONTRAST:  75mL OMNIPAQUE  IOHEXOL  350 MG/ML SOLN COMPARISON:  CTA chest today reported separately. Prior chest radiographs in 2022. FINDINGS: Lower chest: CTA reported separately today. Stable atelectasis at the lung bases on these images. In addition to the comminuted rib fractures described on chest CTA today there is a nondisplaced fracture of posterior right 12th rib. See additional bone details below. Hepatobiliary:  Cirrhotic liver with stigmata of portal venous hypertension including extensive upper abdominal varices. Extensive distal paraesophageal varices are enhancing on series 2, image 14. Nodular liver contour. No discrete liver lesion. Abnormal gallbladder with a large partially calcified 2.7 cm gallstone within the lumen. No pericholecystic inflammation. Trace perihepatic ascites Pancreas: Abnormal. Atrophied pancreatic body and tail with ductal dilatation, and dominant 3.0 x 4.7 cm cystic mass of the pancreatic head and/or uncinate (series 2, image 36 and coronal image 46. No peripancreatic lymphadenopathy, but there are coarse calcifications situated between the pancreas and the porta hepatis (the largest is 13 mm series 2, image 29) which may be postinflammatory calcified nodes. Spleen: Splenomegaly. Perisplenic varices. No discrete splenic lesion.  Adrenals/Urinary Tract: Negative. Mildly to moderately distended urinary bladder. Stomach/Bowel: Redundant large bowel with mild to moderate retained stool. No large bowel inflammation identified. Cecum on a lax mesentery located in the right upper quadrant. Decompressed terminal ileum. Appendix diminutive or absent. Nondilated small bowel. Retained food debris in the stomach. Decompressed duodenum. No pneumoperitoneum. Vascular/Lymphatic: Advanced Aortoiliac calcified atherosclerosis. Infrarenal abdominal aortic aneurysm is calcified and measures up to 3.8 cm maximum diameter on series 2, image 46. Recommend ultrasound follow-up in 3 years. Underlying generalized arterial tortuosity in the abdomen and pelvis. Major arterial structures and portal venous system appear to be patent. Superimposed extensive upper abdominal venous varices, advanced in the gastrohepatic ligament (series 2, image 20) and paraesophageal space. No lymphadenopathy identified. Reproductive: Negative; small fat containing left inguinal hernia. Other: No pelvis free fluid. Musculoskeletal: Lower thoracic spine compression fractures, severe at T12. That level, and similar moderate to severe L1 compression fracture, appear unchanged from 2022 lateral chest radiographs. Superimposed moderate levoconvex lumbar scoliosis and advanced disc and endplate degeneration with multilevel vacuum disc. Minor compression of the L3 and L4 inferior endplates, likely chronic. However, superimposed minimally displaced L1 right transverse process fracture on series 2, image 31. Other lumbar posterior elements appear intact. Sacrum, SI joints, pelvis, and proximal femurs appear intact. IMPRESSION: 1. 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 Peripancreatic coarse soft tissue calcifications which might be postinflammatory nodes. Pancreatic Carcinoma not excluded, although this  constellation might instead be sequelae of chronic pancreatitis. 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 3. Acute nondisplaced fractures of the posterior right 12th rib and the right L1 transverse process, in addition to the other right lower rib fractures on CTA Chest today (reported separately). 4. Moderate to severe T12 and L1 compression fractures are most likely chronic, suspected on 2022 radiographs. 5. Aortic Atherosclerosis (ICD10-I70.0). Infrarenal Abdominal Aortic Aneurysm up to 3.8 cm. Recommend follow-up ultrasound every 3 years. (Ref.: J Vasc Surg. 2018; 67:2-77 and J Am Coll Radiol 2013;10(10):789-794.) Aortic aneurysm NOS (ICD10-I71.9). Electronically Signed   By: VEAR Hurst M.D.   On: 12/17/2023 07:11   CT Angio Chest PE W and/or Wo Contrast Result Date: 12/17/2023 CLINICAL DATA:  78 year old male with rib pain after a fall earlier this month, right 10th rib fracture identified on radiographs 10 days ago. Increasing pain and shortness of breath. EXAM: CT ANGIOGRAPHY CHEST WITH CONTRAST TECHNIQUE: Multidetector CT imaging of the chest was performed using the standard protocol during bolus administration of intravenous contrast. Multiplanar CT image reconstructions and MIPs were obtained to evaluate the vascular anatomy. RADIATION DOSE REDUCTION: This exam was performed according to the departmental dose-optimization program which includes automated  exposure control, adjustment of the mA and/or kV according to patient size and/or use of iterative reconstruction technique. CONTRAST:  75mL OMNIPAQUE  IOHEXOL  350 MG/ML SOLN COMPARISON:  Chest and rib radiographs 12/07/2023. CT Abdomen and Pelvis today reported separately. Two-view chest radiographs 04/02/2021. FINDINGS: Cardiovascular: Adequate contrast bolus timing in the pulmonary arterial tree. Respiratory motion. This is most pronounced in  the lower lobes. No pulmonary artery filling defect is identified. Calcified aortic atherosclerosis. Calcified coronary artery atherosclerosis. Normal heart size. No pericardial effusion. Mediastinum/Nodes: Negative for mediastinal hematoma, mass, lymphadenopathy. Lungs/Pleura: Small volume retained secretions in the trachea. Major airways are patent. Negative for pneumothorax. No significant pleural effusion. Bilateral lower lobe and lung base atelectasis which is platelike and enhancing greater on the right. Upper Abdomen: CT Abdomen and Pelvis today is reported separately. Musculoskeletal: Incompletely visible T1 vertebra. Superior endplate compression of T2 is mild, moderate at T4, mild at T10, mild at T11. And T12 compression fracture is moderate to severe, near vertebra plana. Several of these levels are sclerotic. But all levels are fairly age indeterminate. The T12 compression fracture was probably visible in 2022. Lumbar spine is reported separately today. Comminuted and displaced posterior right 10th rib fracture. Comminuted and less displaced posterior right 11th rib fracture. Right lateral 10th rib also fractured and nondisplaced series 6, image 139. Partially visible comminuted fracture of the right lateral 9th rib on image 145. Nondisplaced fracture of the right anterior 8th rib on image 139. Additional lower ribs may be visible on CT Abdomen and Pelvis today reported separately. No other acute rib fracture identified on these images. Review of the MIP images confirms the above findings. IMPRESSION: 1. Negative for acute pulmonary embolus identified, mild respiratory motion. 2. Right side rib fractures 8 through 11. Most are comminuted and mildly displaced. No associated pneumothorax, pleural effusion, or contusion. Bilateral lung base atelectasis. 3. Multilevel thoracic spine compression fractures, T12 level probably chronic and visible on 2022 radiographs. Other levels are age indeterminate. If  specific therapy such as vertebroplasty is desired, noncontrast MRI or Nuclear Medicine Whole-body Bone Scan would best determine acuity. 4.  CT Abdomen and Pelvis reported separately. 5. Calcified coronary artery, Aortic Atherosclerosis (ICD10-I70.0). Electronically Signed   By: VEAR Hurst M.D.   On: 12/17/2023 06:59        Scheduled Meds:  aspirin  EC  81 mg Oral Daily   enoxaparin  (LOVENOX ) injection  40 mg Subcutaneous Q24H   ferrous sulfate   325 mg Oral Q breakfast   furosemide   20 mg Oral q AM   insulin  aspart  0-5 Units Subcutaneous QHS   insulin  aspart  0-6 Units Subcutaneous TID WC   latanoprost   1 drop Both Eyes QHS   levothyroxine   150 mcg Oral Q0600   pantoprazole   40 mg Oral BID   Continuous Infusions:   LOS: 1 day     Devaughn KATHEE Ban, MD [facility]   If 7PM-7AM, please contact night-coverage [email or web address] Password [email or web address] 12/18/2023, 8:45 AM

## 2023-12-18 NOTE — Care Management CC44 (Signed)
 Condition Code 44 Documentation Completed  Patient Details  Name: Charles Wilkinson MRN: [id number] Date of Birth: 1945/06/26   Condition Code 44 given:  Yes Patient signature on Condition Code 44 notice:  Yes Documentation of 2 MD's agreement:  Yes Code 44 added to claim:  Yes    Racheal LITTIE Schimke, RN 12/18/2023, 9:38 AM

## 2023-12-19 DIAGNOSIS — W19XXXA Unspecified fall, initial encounter: Secondary | ICD-10-CM | POA: Diagnosis not present

## 2023-12-19 DIAGNOSIS — S2241XA Multiple fractures of ribs, right side, initial encounter for closed fracture: Secondary | ICD-10-CM | POA: Diagnosis not present

## 2023-12-19 LAB — BASIC METABOLIC PANEL WITH GFR
Anion gap: 7 (ref 5–15)
BUN: 39 mg/dL — ABNORMAL HIGH (ref 8–23)
CO2: 24 mmol/L (ref 22–32)
Calcium: 8.5 mg/dL — ABNORMAL LOW (ref 8.9–10.3)
Chloride: 95 mmol/L — ABNORMAL LOW (ref 98–111)
Creatinine, Ser: 1.76 mg/dL — ABNORMAL HIGH (ref 0.61–1.24)
GFR, Estimated: 39 mL/min — ABNORMAL LOW (ref >60)
Glucose, Bld: 142 mg/dL — ABNORMAL HIGH (ref 70–99)
Potassium: 4.3 mmol/L (ref 3.5–5.1)
Sodium: 126 mmol/L — ABNORMAL LOW (ref 135–145)

## 2023-12-19 LAB — CBC
HCT: 32.1 % — ABNORMAL LOW (ref 39.0–52.0)
Hemoglobin: 11.4 g/dL — ABNORMAL LOW (ref 13.0–17.0)
MCH: 31.9 pg (ref 26.0–34.0)
MCHC: 35.5 g/dL (ref 30.0–36.0)
MCV: 89.9 fL (ref 80.0–100.0)
Platelets: 84 K/uL — ABNORMAL LOW (ref 150–400)
RBC: 3.57 MIL/uL — ABNORMAL LOW (ref 4.22–5.81)
RDW: 14.4 % (ref 11.5–15.5)
WBC: 4.8 K/uL (ref 4.0–10.5)
nRBC: 0 % (ref 0.0–0.2)

## 2023-12-19 LAB — T4, FREE: Free T4: 1.14 ng/dL — ABNORMAL HIGH (ref 0.61–1.12)

## 2023-12-19 LAB — TSH: TSH: 18.886 u[IU]/mL — ABNORMAL HIGH (ref 0.350–4.500)

## 2023-12-19 LAB — OSMOLALITY, URINE: Osmolality, Ur: 302 mosm/kg (ref 300–900)

## 2023-12-19 LAB — GLUCOSE, CAPILLARY
Glucose-Capillary: 129 mg/dL — ABNORMAL HIGH (ref 70–99)
Glucose-Capillary: 191 mg/dL — ABNORMAL HIGH (ref 70–99)
Glucose-Capillary: 139 mg/dL — ABNORMAL HIGH (ref 70–99)
Glucose-Capillary: 167 mg/dL — ABNORMAL HIGH (ref 70–99)

## 2023-12-19 LAB — SODIUM, URINE, RANDOM: Sodium, Ur: <30 mmol/L

## 2023-12-19 LAB — OSMOLALITY: Osmolality: 275 mosm/kg (ref 275–295)

## 2023-12-19 MED ORDER — SENNA 8.6 MG PO TABS
1.0000 | ORAL_TABLET | Freq: Every day | ORAL | Status: DC
  Administered 2023-12-19 – 2023-12-23 (×3): 8.6 mg via ORAL
  Filled 2023-12-19 (×5): qty 1

## 2023-12-19 NOTE — Progress Notes (Signed)
 PROGRESS NOTE    Charles Wilkinson  FMW:[id number] DOB: Jul 20, 1945 DOA: 12/17/2023 PCP: Epifanio Alm SQUIBB, MD    Brief Narrative:   Charles Wilkinson is a pleasant 78 y.o. male with medical history significant for HTN, DM, HLD, hypothyroidism, NASH, CKD provide appendectomy and hernia repair who presented to ED complaining of right-sided abdominal pain, increased shortness of breath since he fell on 12/07/2023.  He attended ER on 12/07/2023 and diagnosed with a rib fracture.  Since then his daughter and caregiver advised me that he has some shortness of breath and abdominal pain and not able to take care of him at home.  He has a 24/7 coverage however it is increasingly difficult for him to get around at home due to abdominal pain and shortness of breath.  He and his family denied any new injuries or falls.  He is on aspirin  but not on any other anticoagulations.  He has been using walker to ambulate.  He states that he lives in a assisted living facility.  There is no fever, chills, cough, palpitations, nausea, vomiting, diarrhea, urinary symptoms.     Assessment & Plan:   Principal Problem:   Multiple rib fractures Active Problems:   Stroke [facility])   Hyperlipidemia   Fall   Stage 3a chronic kidney disease (HCC)   Type 2 diabetes mellitus with hyperlipidemia (HCC)   Essential hypertension   NASH (nonalcoholic steatohepatitis)   Hypothyroidism   Pancreatic mass   Closed rib fracture   Pressure injury of skin   Parkinson's disease (HCC)  # Right 8-12 rib fractures No pneumonia, hemothorax, pneumothorax. CTA no PE. 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 Prior imaging not available for comparison. LFTs normal. Discussed with Dr. Therisa of GI, he advises outpatient GI f/u  (will need referral or to establish with GI through the [facility]). Outpatient provider will need to attempt to obtain prior imaging for comparison. If sufficient concern for malignancy would likely need EUS and biopsy. Admitter has ordered ca 19-9 and AFP.  - outpatient GI f/u - monitor LFTs here  # T2DM Euglycemic - SSI for now - home jardiance, glipizide, januvia, metformin on hold  # Parkinson's disease Doesn't appear to be on any specific agents  # NASH cirrhosis # Thrombocytopenia With evidence splenomegaly and abdominal varices on CT imaging. Compensated. With thrombocytopenia. INR wnl, LFTs wnl - home spiro, lasix  on hold as below  # Hyponatremia Mild and chronic but today worsened to 126. Home diuretics? - lab w/u today - hold lasix /spiro for the moment  # CKD 3a Stable - monitor  # History CVA - home asa, not on statin at home  # HTN Bp controlled - home losartan  - spiro/lasix  on hold as above  # Hypothyroid - home synthroid  - f/u tsh/t4   DVT prophylaxis: lovenox  Code Status: full Family Communication: daughter [name] updated telephonically 9/13  Level of care: Med-Surg Status is: Observation    Consultants:  none  Procedures: none  Antimicrobials:  none    Subjective: Pain much improved. Enjoying lunch  Objective: Vitals:   12/18/23 1638 12/18/23 2035 12/19/23 0403 12/19/23 0858  BP: (!) 103/58 109/62 (!) 141/69 127/78  Pulse: 63 76 76 66  Resp: 16   18  Temp: 97.9 F (36.6 C)  98.4 F (36.9 C) 98.4 F (36.9 C) 98.1 F (36.7 C)  TempSrc: Oral Oral Oral   SpO2: 97% 96% 94% 97%  Weight:      Height:        Intake/Output Summary (Last 24 hours) at 12/19/2023 1246 Last data filed at 12/19/2023 1100 Gross per 24 hour  Intake 240 ml  Output 550 ml  Net -310 ml   Filed Weights   12/17/23 0425  Weight: 79.2 kg    Examination:  General exam: Appears calm and comfortable  Respiratory system: Clear to auscultation save for rales at  bases Cardiovascular system: S1 & S2 heard, RRR.   Gastrointestinal system: Abdomen is nondistended, soft and nontender.   Central nervous system: Alert and oriented. Non-focal exam Extremities: warm, trace LE edema Skin: No rashes, lesions or ulcers Psychiatry: Judgement and insight appear normal. Mood & affect appropriate.     Data Reviewed: I have personally reviewed following labs and imaging studies  CBC: Recent Labs  Lab 12/17/23 0431 12/18/23 0556 12/19/23 0511  WBC 3.5* 3.0* 4.8  HGB 13.1 12.4* 11.4*  HCT 39.1 35.4* 32.1*  MCV 92.9 90.8 89.9  PLT 90* 82* 84*   Basic Metabolic Panel: Recent Labs  Lab 12/17/23 0431 12/18/23 0556 12/19/23 0511  NA 130* 130* 126*  K 4.4 4.2 4.3  CL 96* 100 95*  CO2 25 24 24   GLUCOSE 134* 113* 142*  BUN 35* 33* 39*  CREATININE 1.65* 1.52* 1.76*  CALCIUM  9.1 8.8* 8.5*   GFR: Estimated Creatinine Clearance: 35.1 mL/min (A) (by C-G formula based on SCr of 1.76 mg/dL (H)). Liver Function Tests: Recent Labs  Lab 12/17/23 0431 12/18/23 0556  AST 29 24  ALT 30 27  ALKPHOS 120 90  BILITOT 1.7* 2.1*  PROT 7.4 6.3*  ALBUMIN 3.6 3.2*   Recent Labs  Lab 12/17/23 0431  LIPASE 47   No results for input(s): AMMONIA in the last 168 hours. Coagulation Profile: Recent Labs  Lab 12/17/23 0450 12/18/23 0556  INR 1.0 1.1   Cardiac Enzymes: No results for input(s): CKTOTAL, CKMB, CKMBINDEX, TROPONINI in the last 168 hours. BNP (last 3 results) No results for input(s): PROBNP in the last 8760 hours. HbA1C: Recent Labs    12/17/23 0431  HGBA1C 6.3*   CBG: Recent Labs  Lab 12/18/23 1210 12/18/23 1641 12/18/23 1949 12/19/23 0854 12/19/23 1208  GLUCAP 195* 120* 174* 129* 191*   Lipid Profile: No results for input(s): CHOL, HDL, LDLCALC, TRIG, CHOLHDL, LDLDIRECT in the last 72 hours. Thyroid  Function Tests: No results for input(s): TSH, T4TOTAL, FREET4, T3FREE, THYROIDAB in the last 72  hours. Anemia Panel: No results for input(s): VITAMINB12, FOLATE, FERRITIN, TIBC, IRON, RETICCTPCT in the last 72 hours. Urine analysis:    Component Value Date/Time   COLORURINE STRAW (A) 12/17/2023 0431   APPEARANCEUR CLEAR (A) 12/17/2023 0431   LABSPEC 1.005 12/17/2023 0431   PHURINE 7.0 12/17/2023 0431   GLUCOSEU >=500 (A) 12/17/2023 0431   HGBUR NEGATIVE 12/17/2023 0431   BILIRUBINUR NEGATIVE 12/17/2023 0431   KETONESUR NEGATIVE 12/17/2023 0431   PROTEINUR NEGATIVE 12/17/2023 0431   NITRITE NEGATIVE 12/17/2023 0431   LEUKOCYTESUR NEGATIVE 12/17/2023 0431   Sepsis Labs: @LABRCNTIP (procalcitonin:4,lacticidven:4)  ) Recent Results (from the past 240 hours)  Resp panel by RT-PCR (RSV, Flu A&B, Covid) Anterior Nasal Swab     Status: None   Collection Time: 12/17/23  4:58 AM   Specimen: Anterior Nasal Swab  Result Value Ref Range Status  SARS Coronavirus 2 by RT PCR NEGATIVE NEGATIVE Final    Comment: (NOTE) SARS-CoV-2 target nucleic acids are NOT DETECTED.  The SARS-CoV-2 RNA is generally detectable in upper respiratory specimens during the acute phase of infection. The lowest concentration of SARS-CoV-2 viral copies this assay can detect is 138 copies/mL. A negative result does not preclude SARS-Cov-2 infection and should not be used as the sole basis for treatment or other patient management decisions. A negative result may occur with  improper specimen collection/handling, submission of specimen other than nasopharyngeal swab, presence of viral mutation(s) within the areas targeted by this assay, and inadequate number of viral copies(<138 copies/mL). A negative result must be combined with clinical observations, patient history, and epidemiological information. The expected result is Negative.  Fact Sheet for Patients:  BloggerCourse.com  Fact Sheet for Healthcare Providers:  SeriousBroker.it  This test  is no t yet approved or cleared by the [place]  FDA and  has been authorized for detection and/or diagnosis of SARS-CoV-2 by FDA under an Emergency Use Authorization (EUA). This EUA will remain  in effect (meaning this test can be used) for the duration of the COVID-19 declaration under Section 564(b)(1) of the Act, 21 U.S.C.section 360bbb-3(b)(1), unless the authorization is terminated  or revoked sooner.       Influenza A by PCR NEGATIVE NEGATIVE Final   Influenza B by PCR NEGATIVE NEGATIVE Final    Comment: (NOTE) The Xpert Xpress SARS-CoV-2/FLU/RSV plus assay is intended as an aid in the diagnosis of influenza from Nasopharyngeal swab specimens and should not be used as a sole basis for treatment. Nasal washings and aspirates are unacceptable for Xpert Xpress SARS-CoV-2/FLU/RSV testing.  Fact Sheet for Patients: BloggerCourse.com  Fact Sheet for Healthcare Providers: SeriousBroker.it  This test is not yet approved or cleared by the [place]  FDA and has been authorized for detection and/or diagnosis of SARS-CoV-2 by FDA under an Emergency Use Authorization (EUA). This EUA will remain in effect (meaning this test can be used) for the duration of the COVID-19 declaration under Section 564(b)(1) of the Act, 21 U.S.C. section 360bbb-3(b)(1), unless the authorization is terminated or revoked.     Resp Syncytial Virus by PCR NEGATIVE NEGATIVE Final    Comment: (NOTE) Fact Sheet for Patients: BloggerCourse.com  Fact Sheet for Healthcare Providers: SeriousBroker.it  This test is not yet approved or cleared by the [place]  FDA and has been authorized for detection and/or diagnosis of SARS-CoV-2 by FDA under an Emergency Use Authorization (EUA). This EUA will remain in effect (meaning this test can be used) for the duration of the COVID-19 declaration under Section  564(b)(1) of the Act, 21 U.S.C. section 360bbb-3(b)(1), unless the authorization is terminated or revoked.  Performed at [facility], [address] [place], [place] [address]          Radiology Studies: No results found.       Scheduled Meds:  acetaminophen   500 mg Oral BID   aspirin  EC  81 mg Oral Daily   ferrous sulfate   325 mg Oral Q breakfast   insulin  aspart  0-5 Units Subcutaneous QHS   insulin  aspart  0-6 Units Subcutaneous TID WC   latanoprost   1 drop Both Eyes QHS   levothyroxine   150 mcg Oral Q0600   losartan   25 mg Oral Daily   oxyCODONE   2.5 mg Oral BID   pantoprazole   40 mg Oral BID   polyethylene glycol  34 g Oral Daily   traMADol   50 mg Oral BID   Continuous Infusions:   LOS: 1 day     Devaughn KATHEE Ban, MD [facility]   If 7PM-7AM, please contact night-coverage [email or web address] Password [email or web address] 12/19/2023, 12:46 PM

## 2023-12-19 NOTE — Plan of Care (Signed)
  Problem: Education: Goal: Ability to describe self-care measures that may prevent or decrease complications (Diabetes Survival Skills Education) will improve Outcome: Progressing   Problem: Coping: Goal: Ability to adjust to condition or change in health will improve Outcome: Progressing   Problem: Fluid Volume: Goal: Ability to maintain a balanced intake and output will improve Outcome: Progressing   Problem: Health Behavior/Discharge Planning: Goal: Ability to identify and utilize available resources and services will improve Outcome: Progressing   Problem: Metabolic: Goal: Ability to maintain appropriate glucose levels will improve Outcome: Progressing   Problem: Skin Integrity: Goal: Risk for impaired skin integrity will decrease Outcome: Progressing   Problem: Tissue Perfusion: Goal: Adequacy of tissue perfusion will improve Outcome: Progressing   Problem: Education: Goal: Knowledge of General Education information will improve Description: Including pain rating scale, medication(s)/side effects and non-pharmacologic comfort measures Outcome: Progressing   Problem: Health Behavior/Discharge Planning: Goal: Ability to manage health-related needs will improve Outcome: Progressing   Problem: Clinical Measurements: Goal: Ability to maintain clinical measurements within normal limits will improve Outcome: Progressing   Problem: Activity: Goal: Risk for activity intolerance will decrease Outcome: Progressing   Problem: Nutrition: Goal: Adequate nutrition will be maintained Outcome: Progressing   Problem: Coping: Goal: Level of anxiety will decrease Outcome: Progressing   Problem: Elimination: Goal: Will not experience complications related to bowel motility Outcome: Progressing   Problem: Pain Managment: Goal: General experience of comfort will improve and/or be controlled Outcome: Progressing   Problem: Safety: Goal: Ability to remain free from injury  will improve Outcome: Progressing   Problem: Skin Integrity: Goal: Risk for impaired skin integrity will decrease Outcome: Progressing

## 2023-12-20 DIAGNOSIS — L89322 Pressure ulcer of left buttock, stage 2: Secondary | ICD-10-CM | POA: Diagnosis present

## 2023-12-20 DIAGNOSIS — K766 Portal hypertension: Secondary | ICD-10-CM | POA: Diagnosis present

## 2023-12-20 DIAGNOSIS — E222 Syndrome of inappropriate secretion of antidiuretic hormone: Secondary | ICD-10-CM | POA: Diagnosis present

## 2023-12-20 DIAGNOSIS — K8689 Other specified diseases of pancreas: Secondary | ICD-10-CM | POA: Diagnosis not present

## 2023-12-20 DIAGNOSIS — E039 Hypothyroidism, unspecified: Secondary | ICD-10-CM | POA: Diagnosis not present

## 2023-12-20 DIAGNOSIS — N178 Other acute kidney failure: Secondary | ICD-10-CM | POA: Diagnosis present

## 2023-12-20 DIAGNOSIS — Z7982 Long term (current) use of aspirin: Secondary | ICD-10-CM | POA: Diagnosis not present

## 2023-12-20 DIAGNOSIS — S2241XD Multiple fractures of ribs, right side, subsequent encounter for fracture with routine healing: Secondary | ICD-10-CM | POA: Diagnosis not present

## 2023-12-20 DIAGNOSIS — Z7401 Bed confinement status: Secondary | ICD-10-CM | POA: Diagnosis not present

## 2023-12-20 DIAGNOSIS — R0602 Shortness of breath: Secondary | ICD-10-CM | POA: Diagnosis present

## 2023-12-20 DIAGNOSIS — N183 Chronic kidney disease, stage 3 unspecified: Secondary | ICD-10-CM | POA: Diagnosis not present

## 2023-12-20 DIAGNOSIS — Y92239 Unspecified place in hospital as the place of occurrence of the external cause: Secondary | ICD-10-CM | POA: Diagnosis present

## 2023-12-20 DIAGNOSIS — Z8249 Family history of ischemic heart disease and other diseases of the circulatory system: Secondary | ICD-10-CM | POA: Diagnosis not present

## 2023-12-20 DIAGNOSIS — E1122 Type 2 diabetes mellitus with diabetic chronic kidney disease: Secondary | ICD-10-CM | POA: Diagnosis not present

## 2023-12-20 DIAGNOSIS — D6959 Other secondary thrombocytopenia: Secondary | ICD-10-CM | POA: Diagnosis present

## 2023-12-20 DIAGNOSIS — I6381 Other cerebral infarction due to occlusion or stenosis of small artery: Secondary | ICD-10-CM | POA: Diagnosis not present

## 2023-12-20 DIAGNOSIS — Z79890 Hormone replacement therapy (postmenopausal): Secondary | ICD-10-CM | POA: Diagnosis not present

## 2023-12-20 DIAGNOSIS — Y92009 Unspecified place in unspecified non-institutional (private) residence as the place of occurrence of the external cause: Secondary | ICD-10-CM | POA: Diagnosis not present

## 2023-12-20 DIAGNOSIS — E785 Hyperlipidemia, unspecified: Secondary | ICD-10-CM | POA: Diagnosis present

## 2023-12-20 DIAGNOSIS — K7581 Nonalcoholic steatohepatitis (NASH): Secondary | ICD-10-CM | POA: Diagnosis not present

## 2023-12-20 DIAGNOSIS — I129 Hypertensive chronic kidney disease with stage 1 through stage 4 chronic kidney disease, or unspecified chronic kidney disease: Secondary | ICD-10-CM | POA: Diagnosis present

## 2023-12-20 DIAGNOSIS — Z1152 Encounter for screening for COVID-19: Secondary | ICD-10-CM | POA: Diagnosis not present

## 2023-12-20 DIAGNOSIS — G20A1 Parkinson's disease without dyskinesia, without mention of fluctuations: Secondary | ICD-10-CM | POA: Diagnosis not present

## 2023-12-20 DIAGNOSIS — I10 Essential (primary) hypertension: Secondary | ICD-10-CM | POA: Diagnosis not present

## 2023-12-20 DIAGNOSIS — S2241XA Multiple fractures of ribs, right side, initial encounter for closed fracture: Secondary | ICD-10-CM | POA: Diagnosis present

## 2023-12-20 DIAGNOSIS — S32018D Other fracture of first lumbar vertebra, subsequent encounter for fracture with routine healing: Secondary | ICD-10-CM | POA: Diagnosis not present

## 2023-12-20 DIAGNOSIS — Z794 Long term (current) use of insulin: Secondary | ICD-10-CM | POA: Diagnosis not present

## 2023-12-20 DIAGNOSIS — N1831 Chronic kidney disease, stage 3a: Secondary | ICD-10-CM | POA: Diagnosis not present

## 2023-12-20 DIAGNOSIS — E1169 Type 2 diabetes mellitus with other specified complication: Secondary | ICD-10-CM | POA: Diagnosis present

## 2023-12-20 DIAGNOSIS — I700 Atherosclerosis of aorta: Secondary | ICD-10-CM | POA: Diagnosis present

## 2023-12-20 DIAGNOSIS — I6782 Cerebral ischemia: Secondary | ICD-10-CM | POA: Diagnosis not present

## 2023-12-20 DIAGNOSIS — W19XXXA Unspecified fall, initial encounter: Secondary | ICD-10-CM | POA: Diagnosis present

## 2023-12-20 DIAGNOSIS — N179 Acute kidney failure, unspecified: Secondary | ICD-10-CM | POA: Diagnosis not present

## 2023-12-20 DIAGNOSIS — I639 Cerebral infarction, unspecified: Secondary | ICD-10-CM | POA: Diagnosis not present

## 2023-12-20 DIAGNOSIS — Z833 Family history of diabetes mellitus: Secondary | ICD-10-CM | POA: Diagnosis not present

## 2023-12-20 DIAGNOSIS — K7460 Unspecified cirrhosis of liver: Secondary | ICD-10-CM | POA: Diagnosis present

## 2023-12-20 DIAGNOSIS — S32019A Unspecified fracture of first lumbar vertebra, initial encounter for closed fracture: Secondary | ICD-10-CM | POA: Diagnosis present

## 2023-12-20 DIAGNOSIS — E782 Mixed hyperlipidemia: Secondary | ICD-10-CM | POA: Diagnosis not present

## 2023-12-20 DIAGNOSIS — Z7984 Long term (current) use of oral hypoglycemic drugs: Secondary | ICD-10-CM | POA: Diagnosis not present

## 2023-12-20 DIAGNOSIS — E871 Hypo-osmolality and hyponatremia: Secondary | ICD-10-CM | POA: Diagnosis not present

## 2023-12-20 DIAGNOSIS — R9082 White matter disease, unspecified: Secondary | ICD-10-CM | POA: Diagnosis not present

## 2023-12-20 LAB — BASIC METABOLIC PANEL WITH GFR
Anion gap: 9 (ref 5–15)
BUN: 37 mg/dL — ABNORMAL HIGH (ref 8–23)
CO2: 24 mmol/L (ref 22–32)
Calcium: 8.7 mg/dL — ABNORMAL LOW (ref 8.9–10.3)
Chloride: 92 mmol/L — ABNORMAL LOW (ref 98–111)
Creatinine, Ser: 1.37 mg/dL — ABNORMAL HIGH (ref 0.61–1.24)
GFR, Estimated: 52 mL/min — ABNORMAL LOW (ref >60)
Glucose, Bld: 148 mg/dL — ABNORMAL HIGH (ref 70–99)
Potassium: 4.4 mmol/L (ref 3.5–5.1)
Sodium: 125 mmol/L — ABNORMAL LOW (ref 135–145)

## 2023-12-20 LAB — AFP TUMOR MARKER: AFP, Serum, Tumor Marker: 26.5 ng/mL — ABNORMAL HIGH (ref 0.0–8.4)

## 2023-12-20 LAB — GLUCOSE, CAPILLARY
Glucose-Capillary: 146 mg/dL — ABNORMAL HIGH (ref 70–99)
Glucose-Capillary: 154 mg/dL — ABNORMAL HIGH (ref 70–99)
Glucose-Capillary: 162 mg/dL — ABNORMAL HIGH (ref 70–99)
Glucose-Capillary: 149 mg/dL — ABNORMAL HIGH (ref 70–99)

## 2023-12-20 MED ORDER — SODIUM CHLORIDE 0.9 % IV SOLN: INTRAVENOUS | Status: AC

## 2023-12-20 NOTE — Progress Notes (Signed)
 Mobility Specialist - Progress Note   12/20/23 0900  Mobility  Activity Stood at bedside;Pivoted/transferred from bed to chair  Level of Assistance Moderate assist, patient does 50-74%  Assistive Device Front wheel walker  Distance Ambulated (ft) 3 ft  Range of Motion/Exercises Active  Activity Response Tolerated well  Mobility Referral Yes  Mobility visit 1 Mobility  Mobility Specialist Start Time (ACUTE ONLY) 0913  Mobility Specialist Stop Time (ACUTE ONLY) [id number]  Mobility Specialist Time Calculation (min) (ACUTE ONLY) 13 min     Pt resting in bed on RA upon entry. Pt STS and  ModA with RW. Pt has very heavy backward lean manual pull forward and verbal encouragement to keep head raised required throughout the session. Pt

## 2023-12-20 NOTE — Plan of Care (Signed)
  Problem: Coping: Goal: Ability to adjust to condition or change in health will improve Outcome: Progressing   Problem: Health Behavior/Discharge Planning: Goal: Ability to identify and utilize available resources and services will improve Outcome: Progressing Goal: Ability to manage health-related needs will improve Outcome: Progressing   Problem: Metabolic: Goal: Ability to maintain appropriate glucose levels will improve Outcome: Progressing   Problem: Nutritional: Goal: Maintenance of adequate nutrition will improve Outcome: Progressing   Problem: Skin Integrity: Goal: Risk for impaired skin integrity will decrease Outcome: Progressing

## 2023-12-20 NOTE — TOC CM/SW Note (Signed)
..  Transition of Care [facility]) - Inpatient Brief Assessment   Patient Details  Name: Charles Wilkinson MRN: [id number] Date of Birth: 03-Apr-1946  Transition of Care [facility]) CM/SW Contact:    Edsel DELENA Fischer, LCSW Phone Number: 12/20/2023, 11:04 AM   Clinical Narrative:  FL2 completed. TOC submitted FL2 for rehab placement. Waiting on response   Transition of Care Asessment:

## 2023-12-20 NOTE — Progress Notes (Signed)
 Mobility Specialist - Progress Note     12/20/23 1429  Mobility  Activity Stood at bedside;Ambulated with assistance  Level of Assistance Moderate assist, patient does 50-74% (+2 to stand)  Assistive Device Front wheel walker  Distance Ambulated (ft) 40 ft  Range of Motion/Exercises Active  Activity Response Tolerated well  Mobility Referral Yes  Mobility visit 1 Mobility  Mobility Specialist Start Time (ACUTE ONLY) 1354  Mobility Specialist Stop Time (ACUTE ONLY) 1409  Mobility Specialist Time Calculation (min) (ACUTE ONLY) 15 min   Pt resting in bed on RA upon entry. Pt STS +2 and ambulates to bathroom Mod/MinA for safety with RW. Pt required manual handplacement cuing to stand and sit as well as verbal cuing to keep head raised during ambulation. Pt returned to recliner and left with needs in reach and chair alarm activated.   Guido Rumble Mobility Specialist 12/20/23, 2:42 PM

## 2023-12-20 NOTE — [place] FL2 (Signed)
   MEDICAID FL2 LEVEL OF CARE FORM     IDENTIFICATION  Patient Name: Charles Wilkinson Birthdate: Dec 10, 1945 Sex: male Admission Date (Current Location): 12/17/2023  [facility] and [place] Number:  Chiropodist and Address:  [facility], [address], [place], [place] [address]      Provider Number: [id number]  Attending Physician Name and Address:  Kandis Devaughn Sayres, MD  Relative Name and Phone Number:  Sion, Reinders  (Daughter)  [phone]    Current Level of Care: Hospital Recommended Level of Care: Skilled Nursing Facility Prior Approval Number:    Date Approved/Denied:   PASRR Number: [id number]  Discharge Plan: SNF    Current Diagnoses: Patient Active Problem List   Diagnosis Date Noted   Pressure injury of skin 12/18/2023   Parkinson's disease (HCC) 12/18/2023   Multiple rib fractures 12/18/2023   Pancreatic mass 12/17/2023   Closed rib fracture 12/17/2023   Stroke (HCC) 07/12/2021   Pancytopenia (HCC) 07/12/2021   Type II diabetes mellitus with renal manifestations (HCC) 07/12/2021   Acute renal failure superimposed on stage 3a chronic kidney disease (HCC) 07/12/2021   Weakness    Thrombocytopenia (HCC)    Acute pain of left knee    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 04/01/2021   Hyperlipidemia 04/01/2021   Traumatic rhabdomyolysis (HCC)    Fall    Stage 3a chronic kidney disease (HCC)    Type 2 diabetes mellitus with hyperlipidemia (HCC)    Essential hypertension    NASH (nonalcoholic steatohepatitis)    Hypothyroidism     Orientation RESPIRATION BLADDER Height & Weight     Self, Time, Situation  Normal   Weight: 174 lb 11.2 oz (79.2 kg) Height:  5' 10 (177.8 cm)  BEHAVIORAL SYMPTOMS/MOOD NEUROLOGICAL BOWEL NUTRITION STATUS      Continent Diet  AMBULATORY STATUS COMMUNICATION OF NEEDS Skin   Limited Assist Verbally Normal                       Personal Care Assistance Level of Assistance   Bathing, Feeding, Dressing Bathing Assistance: Limited assistance Feeding assistance: Independent Dressing Assistance: Limited assistance     Functional Limitations Info  Sight, Hearing, Speech Sight Info: Adequate (wears glasses) Hearing Info: Adequate      SPECIAL CARE FACTORS FREQUENCY  PT (By licensed PT), OT (By licensed OT)     PT Frequency: 5x a week OT Frequency: 3x a weel            Contractures Contractures Info: Not present    Additional Factors Info  Code Status, Allergies Code Status Info: Full Allergies Info: No Known Allegeries           Current Medications (12/20/2023):  This is the current hospital active medication list Current Facility-Administered Medications  Medication Dose Route Frequency Provider Last Rate Last Admin   0.9 %  sodium chloride  infusion   Intravenous Continuous Wouk, Devaughn Sayres, MD       acetaminophen  (TYLENOL ) tablet 500 mg  500 mg Oral Q6H PRN Wouk, Devaughn Sayres, MD       Or   acetaminophen  (TYLENOL ) suppository 325 mg  325 mg Rectal Q6H PRN Wouk, Devaughn Sayres, MD       acetaminophen  (TYLENOL ) tablet 500 mg  500 mg Oral BID Kandis Devaughn Sayres, MD   500 mg at 12/20/23 9461   aspirin  EC tablet 81 mg  81 mg Oral Daily Paudel, Keshab, MD   81 mg at 12/19/23  9147   ferrous sulfate  tablet 325 mg  325 mg Oral Q breakfast Paudel, Nena, MD   325 mg at 12/19/23 9147   insulin  aspart (novoLOG ) injection 0-5 Units  0-5 Units Subcutaneous QHS Paudel, Nena, MD       insulin  aspart (novoLOG ) injection 0-6 Units  0-6 Units Subcutaneous TID WC Paudel, Keshab, MD   1 Units at 12/19/23 1240   latanoprost  (XALATAN ) 0.005 % ophthalmic solution 1 drop  1 drop Both Eyes QHS Paudel, Keshab, MD   1 drop at 12/19/23 2210   levothyroxine  (SYNTHROID ) tablet 150 mcg  150 mcg Oral Q0600 Paudel, Keshab, MD   150 mcg at 12/20/23 0535   losartan  (COZAAR ) tablet 25 mg  25 mg Oral Daily Kandis Devaughn Sayres, MD   25 mg at 12/19/23 9145   ondansetron  (ZOFRAN )  tablet 4 mg  4 mg Oral Q6H PRN Paudel, Keshab, MD       Or   ondansetron  (ZOFRAN ) injection 4 mg  4 mg Intravenous Q6H PRN Roann Nena, MD       oxyCODONE  (Oxy IR/ROXICODONE ) immediate release tablet 2.5 mg  2.5 mg Oral BID Kandis Devaughn Sayres, MD   2.5 mg at 12/19/23 2209   pantoprazole  (PROTONIX ) EC tablet 40 mg  40 mg Oral BID Paudel, Keshab, MD   40 mg at 12/19/23 2209   polyethylene glycol (MIRALAX  / GLYCOLAX ) packet 34 g  34 g Oral Daily Kandis Devaughn Sayres, MD   34 g at 12/19/23 9148   senna (SENOKOT) tablet 8.6 mg  1 tablet Oral QHS Kandis Devaughn Sayres, MD   8.6 mg at 12/19/23 2210   traMADol  (ULTRAM ) tablet 50 mg  50 mg Oral BID Kandis Devaughn Sayres, MD   50 mg at 12/19/23 2001     Discharge Medications: Please see discharge summary for a list of discharge medications.  Relevant Imaging Results:  Relevant Lab Results:   Additional Information SS #: [id number]  DOB: 28-Jun-1945  Edsel DELENA Fischer, LCSW

## 2023-12-20 NOTE — Progress Notes (Signed)
 Mobility Specialist - Progress Note     12/20/23 1059  Mobility  Activity Stood at bedside;Pivoted/transferred from chair to bed  Level of Assistance Moderate assist, patient does 50-74%  Assistive Device Front wheel walker  Range of Motion/Exercises Active  Activity Response Tolerated well  Mobility Referral Yes  Mobility visit 1 Mobility  Mobility Specialist Start Time (ACUTE ONLY) 1051  Mobility Specialist Stop Time (ACUTE ONLY) 1059  Mobility Specialist Time Calculation (min) (ACUTE ONLY) 8 min   Pt resting in recliner on RA upon entry. Pt STS and pivots to bed ModA with RW. Pt left in bed with needs in reach. Bed alarm activated.   Guido Rumble Mobility Specialist 12/20/23, 11:03 AM   Guido Rumble Mobility Specialist 12/20/23, 11:01 AM

## 2023-12-20 NOTE — Progress Notes (Signed)
 PROGRESS NOTE    Charles Wilkinson  FMW:[id number] DOB: [birth year] DOA: 12/17/2023 PCP: Epifanio Alm SQUIBB, MD    Brief Narrative:   Charles Wilkinson is a pleasant 78 y.o. male with medical history significant for HTN, DM, HLD, hypothyroidism, NASH, CKD provide appendectomy and hernia repair who presented to ED complaining of right-sided abdominal pain, increased shortness of breath since he fell on 12/07/2023.  He attended ER on 12/07/2023 and diagnosed with a rib fracture.  Since then his daughter and caregiver advised me that he has some shortness of breath and abdominal pain and not able to take care of him at home.  He has a 24/7 coverage however it is increasingly difficult for him to get around at home due to abdominal pain and shortness of breath.  He and his family denied any new injuries or falls.  He is on aspirin  but not on any other anticoagulations.  He has been using walker to ambulate.  He states that he lives in a assisted living facility.  There is no fever, chills, cough, palpitations, nausea, vomiting, diarrhea, urinary symptoms.     Assessment & Plan:   Principal Problem:   Multiple rib fractures Active Problems:   Stroke [facility])   Hyperlipidemia   Fall   Stage 3a chronic kidney disease (HCC)   Type 2 diabetes mellitus with hyperlipidemia (HCC)   Essential hypertension   NASH (nonalcoholic steatohepatitis)   Hypothyroidism   Pancreatic mass   Closed rib fracture   Pressure injury of skin   Parkinson's disease (HCC)  # Right 8-12 rib fractures No pneumonia, hemothorax, pneumothorax. CTA no PE. Pain is controlled. Has significant ecchymoses right flank extending down right posterior leg. Ct of pelvis was neg for fracture.  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 Prior imaging not available for comparison. LFTs normal. Discussed with Dr. Therisa of GI, he advises outpatient GI f/u (will need referral or to establish with GI through the [facility]). Outpatient provider will need to attempt to obtain prior imaging for comparison. If sufficient concern for malignancy would likely need EUS and biopsy. Admitter has ordered ca 19-9 and AFP.  - outpatient GI f/u - monitor LFTs here  # T2DM Euglycemic - SSI for now - home jardiance, glipizide, januvia, metformin on hold  # Parkinson's disease Doesn't appear to be on any specific agents  # NASH cirrhosis # Thrombocytopenia With evidence splenomegaly and abdominal varices on CT imaging. Compensated. With thrombocytopenia. INR wnl, LFTs wnl - home spiro, lasix  on hold as below  # Hyponatremia Mild and chronic but today worsened to 125. Home diuretics? Labs suggestive of hypovolemia - 1 liter NS today - hold lasix /spiro for the moment (last dose 9/13)  # CKD 3a Stable - monitor  # History CVA - home asa, not on statin at home  # HTN Bp controlled - home losartan  - spiro/lasix  on hold as above  # Hypothyroid TFTs are odd, with elevated tsh and free t4. Tsh has been normal in past on patient's synthroid  so tsh-producing adenoma sounds less likely - home synthroid  - f/u endo   DVT prophylaxis: lovenox  Code Status: full Family Communication: daughter [name] updated at bedside 9/13  Level of care: Med-Surg Status is: Observation    Consultants:  none  Procedures: none  Antimicrobials:  none    Subjective: Pain much improved. Enjoying lunch. Was up in chair earlier today  Objective: Vitals:   12/19/23 2113 12/20/23 0113 12/20/23 0513 12/20/23 0734  BP: (!) 146/76 128/67 108/64 130/71  Pulse: 68 68 69 71  Resp:    18  Temp: (!) 97.5 F (36.4 C) 97.8 F (36.6 C) 98 F (36.7 C) 98.8 F (37.1 C)  TempSrc: Oral Oral Oral Oral  SpO2: 99% 94% 94% 97%  Weight:      Height:         Intake/Output Summary (Last 24 hours) at 12/20/2023 1321 Last data filed at 12/20/2023 1213 Gross per 24 hour  Intake 660 ml  Output 900 ml  Net -240 ml   Filed Weights   12/17/23 0425  Weight: 79.2 kg    Examination:  General exam: Appears calm and comfortable  Respiratory system: Clear to auscultation save for rales at bases Cardiovascular system: S1 & S2 heard, RRR.   Gastrointestinal system: Abdomen is nondistended, soft and nontender.   Central nervous system: Alert and oriented. Non-focal exam Extremities: warm, trace LE edema Skin: No rashes, lesions or ulcers Psychiatry: Judgement and insight appear normal. Mood & affect appropriate.     Data Reviewed: I have personally reviewed following labs and imaging studies  CBC: Recent Labs  Lab 12/17/23 0431 12/18/23 0556 12/19/23 0511  WBC 3.5* 3.0* 4.8  HGB 13.1 12.4* 11.4*  HCT 39.1 35.4* 32.1*  MCV 92.9 90.8 89.9  PLT 90* 82* 84*   Basic Metabolic Panel: Recent Labs  Lab 12/17/23 0431 12/18/23 0556 12/19/23 0511 12/20/23 0539  NA 130* 130* 126* 125*  K 4.4 4.2 4.3 4.4  CL 96* 100 95* 92*  CO2 25 24 24 24   GLUCOSE 134* 113* 142* 148*  BUN 35* 33* 39* 37*  CREATININE 1.65* 1.52* 1.76* 1.37*  CALCIUM  9.1 8.8* 8.5* 8.7*   GFR: Estimated Creatinine Clearance: 45.1 mL/min (A) (by C-G formula based on SCr of 1.37 mg/dL (H)). Liver Function Tests: Recent Labs  Lab 12/17/23 0431 12/18/23 0556  AST 29 24  ALT 30 27  ALKPHOS 120 90  BILITOT 1.7* 2.1*  PROT 7.4 6.3*  ALBUMIN 3.6 3.2*   Recent Labs  Lab 12/17/23 0431  LIPASE 47   No results for input(s): AMMONIA in the last 168 hours. Coagulation Profile: Recent Labs  Lab 12/17/23 0450 12/18/23 0556  INR 1.0 1.1   Cardiac Enzymes: No results for input(s): CKTOTAL, CKMB, CKMBINDEX, TROPONINI in the last 168 hours. BNP (last 3 results) No results for input(s): PROBNP in the last 8760 hours. HbA1C: No results for input(s):  HGBA1C in the last 72 hours.  CBG: Recent Labs  Lab 12/19/23 1208 12/19/23 1708 12/19/23 2117 12/20/23 0736 12/20/23 1153  GLUCAP 191* 139* 167* 146* 154*   Lipid Profile: No results for input(s): CHOL, HDL, LDLCALC, TRIG, CHOLHDL, LDLDIRECT in the last 72 hours. Thyroid  Function Tests: Recent Labs    12/19/23 0511  TSH 18.886*  FREET4 1.14*   Anemia Panel: No results for input(s): VITAMINB12, FOLATE, FERRITIN, TIBC, IRON, RETICCTPCT in the last 72 hours. Urine analysis:    Component Value Date/Time   COLORURINE STRAW (A) 12/17/2023 0431   APPEARANCEUR CLEAR (A) 12/17/2023 0431   LABSPEC 1.005 12/17/2023 0431   PHURINE 7.0 12/17/2023 0431   GLUCOSEU >=500 (A) 12/17/2023 0431   HGBUR NEGATIVE 12/17/2023 0431   BILIRUBINUR NEGATIVE 12/17/2023 0431   KETONESUR NEGATIVE 12/17/2023 0431  PROTEINUR NEGATIVE 12/17/2023 0431   NITRITE NEGATIVE 12/17/2023 0431   LEUKOCYTESUR NEGATIVE 12/17/2023 0431   Sepsis Labs: @LABRCNTIP (procalcitonin:4,lacticidven:4)  ) Recent Results (from the past 240 hours)  Resp panel by RT-PCR (RSV, Flu A&B, Covid) Anterior Nasal Swab     Status: None   Collection Time: 12/17/23  4:58 AM   Specimen: Anterior Nasal Swab  Result Value Ref Range Status   SARS Coronavirus 2 by RT PCR NEGATIVE NEGATIVE Final    Comment: (NOTE) SARS-CoV-2 target nucleic acids are NOT DETECTED.  The SARS-CoV-2 RNA is generally detectable in upper respiratory specimens during the acute phase of infection. The lowest concentration of SARS-CoV-2 viral copies this assay can detect is 138 copies/mL. A negative result does not preclude SARS-Cov-2 infection and should not be used as the sole basis for treatment or other patient management decisions. A negative result may occur with  improper specimen collection/handling, submission of specimen other than nasopharyngeal swab, presence of viral mutation(s) within the areas targeted by this assay,  and inadequate number of viral copies(<138 copies/mL). A negative result must be combined with clinical observations, patient history, and epidemiological information. The expected result is Negative.  Fact Sheet for Patients:  BloggerCourse.com  Fact Sheet for Healthcare Providers:  SeriousBroker.it  This test is no t yet approved or cleared by the [place]  FDA and  has been authorized for detection and/or diagnosis of SARS-CoV-2 by FDA under an Emergency Use Authorization (EUA). This EUA will remain  in effect (meaning this test can be used) for the duration of the COVID-19 declaration under Section 564(b)(1) of the Act, 21 U.S.C.section 360bbb-3(b)(1), unless the authorization is terminated  or revoked sooner.       Influenza A by PCR NEGATIVE NEGATIVE Final   Influenza B by PCR NEGATIVE NEGATIVE Final    Comment: (NOTE) The Xpert Xpress SARS-CoV-2/FLU/RSV plus assay is intended as an aid in the diagnosis of influenza from Nasopharyngeal swab specimens and should not be used as a sole basis for treatment. Nasal washings and aspirates are unacceptable for Xpert Xpress SARS-CoV-2/FLU/RSV testing.  Fact Sheet for Patients: BloggerCourse.com  Fact Sheet for Healthcare Providers: SeriousBroker.it  This test is not yet approved or cleared by the [place]  FDA and has been authorized for detection and/or diagnosis of SARS-CoV-2 by FDA under an Emergency Use Authorization (EUA). This EUA will remain in effect (meaning this test can be used) for the duration of the COVID-19 declaration under Section 564(b)(1) of the Act, 21 U.S.C. section 360bbb-3(b)(1), unless the authorization is terminated or revoked.     Resp Syncytial Virus by PCR NEGATIVE NEGATIVE Final    Comment: (NOTE) Fact Sheet for Patients: BloggerCourse.com  Fact Sheet for  Healthcare Providers: SeriousBroker.it  This test is not yet approved or cleared by the [place]  FDA and has been authorized for detection and/or diagnosis of SARS-CoV-2 by FDA under an Emergency Use Authorization (EUA). This EUA will remain in effect (meaning this test can be used) for the duration of the COVID-19 declaration under Section 564(b)(1) of the Act, 21 U.S.C. section 360bbb-3(b)(1), unless the authorization is terminated or revoked.  Performed at [facility], [address], [place], [place] [address]          Radiology Studies: No results found.       Scheduled Meds:  acetaminophen   500 mg Oral BID   aspirin  EC  81 mg Oral Daily   ferrous sulfate   325 mg Oral Q breakfast   insulin   aspart  0-5 Units Subcutaneous QHS   insulin  aspart  0-6 Units Subcutaneous TID WC   latanoprost   1 drop Both Eyes QHS   levothyroxine   150 mcg Oral Q0600   losartan   25 mg Oral Daily   oxyCODONE   2.5 mg Oral BID   pantoprazole   40 mg Oral BID   polyethylene glycol  34 g Oral Daily   senna  1 tablet Oral QHS   traMADol   50 mg Oral BID   Continuous Infusions:  sodium chloride        LOS: 1 day     Devaughn KATHEE Ban, MD [facility]   If 7PM-7AM, please contact night-coverage [email or web address] Password [email or web address] 12/20/2023, 1:21 PM

## 2023-12-21 DIAGNOSIS — S2241XA Multiple fractures of ribs, right side, initial encounter for closed fracture: Secondary | ICD-10-CM | POA: Diagnosis not present

## 2023-12-21 LAB — BASIC METABOLIC PANEL WITH GFR
Anion gap: 9 (ref 5–15)
BUN: 32 mg/dL — ABNORMAL HIGH (ref 8–23)
CO2: 22 mmol/L (ref 22–32)
Calcium: 8.5 mg/dL — ABNORMAL LOW (ref 8.9–10.3)
Chloride: 91 mmol/L — ABNORMAL LOW (ref 98–111)
Creatinine, Ser: 1.19 mg/dL (ref 0.61–1.24)
GFR, Estimated: >60 mL/min (ref >60)
Glucose, Bld: 135 mg/dL — ABNORMAL HIGH (ref 70–99)
Potassium: 4.4 mmol/L (ref 3.5–5.1)
Sodium: 122 mmol/L — ABNORMAL LOW (ref 135–145)

## 2023-12-21 LAB — GLUCOSE, CAPILLARY
Glucose-Capillary: 154 mg/dL — ABNORMAL HIGH (ref 70–99)
Glucose-Capillary: 216 mg/dL — ABNORMAL HIGH (ref 70–99)
Glucose-Capillary: 163 mg/dL — ABNORMAL HIGH (ref 70–99)
Glucose-Capillary: 149 mg/dL — ABNORMAL HIGH (ref 70–99)

## 2023-12-21 LAB — SODIUM, URINE, RANDOM: Sodium, Ur: <30 mmol/L

## 2023-12-21 LAB — CA 19-9 (SERIAL): CA 19-9: 11 U/mL (ref 0–35)

## 2023-12-21 LAB — OSMOLALITY, URINE: Osmolality, Ur: 474 mosm/kg (ref 300–900)

## 2023-12-21 MED ORDER — SODIUM CHLORIDE 1 G PO TABS
1.0000 g | ORAL_TABLET | Freq: Three times a day (TID) | ORAL | Status: DC
  Administered 2023-12-21 – 2023-12-22 (×3): 1 g via ORAL
  Filled 2023-12-21 (×3): qty 1

## 2023-12-21 MED ORDER — TRAMADOL HCL 50 MG PO TABS: 25.0000 mg | ORAL_TABLET | Freq: Two times a day (BID) | ORAL | Status: DC

## 2023-12-21 MED ORDER — TRAMADOL HCL 50 MG PO TABS
50.0000 mg | ORAL_TABLET | Freq: Two times a day (BID) | ORAL | Status: DC
  Administered 2023-12-21 – 2023-12-24 (×6): 50 mg via ORAL
  Filled 2023-12-21 (×6): qty 1

## 2023-12-21 MED ORDER — OXYCODONE HCL 5 MG PO TABS: 2.5000 mg | ORAL_TABLET | Freq: Four times a day (QID) | ORAL | Status: DC | PRN

## 2023-12-21 MED ORDER — COSYNTROPIN 0.25 MG IJ SOLR
0.2500 mg | Freq: Once | INTRAMUSCULAR | Status: AC
  Administered 2023-12-22: 0.25 mg via INTRAVENOUS
  Filled 2023-12-21: qty 0.25

## 2023-12-21 MED ORDER — ZINC OXIDE 40 % EX OINT
TOPICAL_OINTMENT | CUTANEOUS | Status: AC
  Administered 2023-12-21: 1 via TOPICAL
  Filled 2023-12-21: qty 113

## 2023-12-21 NOTE — TOC Progression Note (Addendum)
 Transition of Care [facility]) - Progression Note    Patient Details  Name: Charles Wilkinson MRN: [id number] Date of Birth: 06/22/1945  [facility]) CM/SW Contact  Dalia GORMAN Fuse, RN Phone Number: 12/21/2023, 3:27 PM  Clinical Narrative:     Patient has a bed at [facility], ins shara is pending at this time. Pending [id number].  WOM selected in Hub and TOC placed call to Barnie at [facility] to make her aware the patient's daughter [name] accepted the bed offer.  TOC received an update from the MD, the patient's NA continues to trend low. He is not medically ready for discharge at this time. TOC will continue to follow.                    Expected Discharge Plan and Services                                               Social Drivers of Health (SDOH) Interventions SDOH Screenings   Food Insecurity: No Food Insecurity (12/17/2023)  Housing: Low Risk  (12/17/2023)  Transportation Needs: No Transportation Needs (12/17/2023)  Utilities: Not At Risk (12/17/2023)  Financial Resource Strain: Low Risk  (12/15/2023)   Received from [facility]  Social Connections: Moderately Isolated (12/17/2023)  Tobacco Use: Medium Risk (12/17/2023)    Readmission Risk Interventions     No data to display

## 2023-12-21 NOTE — Plan of Care (Signed)
  Problem: Metabolic: Goal: Ability to maintain appropriate glucose levels will improve Outcome: Progressing Note: Has required sliding scale coverage this shift   Problem: Nutritional: Goal: Maintenance of adequate nutrition will improve Outcome: Progressing Note: Adequate nutrition as evidenced by 75-100% of meal consumption   Problem: Skin Integrity: Goal: Risk for impaired skin integrity will decrease Outcome: Progressing Note: Pressure wound to left side of buttocks. Wound care nurtse in to see today   Problem: Clinical Measurements: Goal: Will remain free from infection Outcome: Progressing Note: No signs or symptoms of infection   Problem: Activity: Goal: Risk for activity intolerance will decrease Outcome: Progressing Note: Has been OOB to chair   Problem: Elimination: Goal: Will not experience complications related to urinary retention Outcome: Progressing Note: No signs or symptoms of urinary retention   Problem: Pain Managment: Goal: General experience of comfort will improve and/or be controlled Outcome: Progressing Note: He is receiving scheduled pain medication   Problem: Safety: Goal: Ability to remain free from injury will improve Outcome: Progressing Note: Admitted as a result of a fall at home with multiple rib fractures

## 2023-12-21 NOTE — Progress Notes (Signed)
 Occupational Therapy Treatment Patient Details Name: Charles Wilkinson MRN: [id number] DOB: 1946-02-25 Today's Date: 12/21/2023   History of present illness Pt is a 78 year old male presented to ED complaining of right-sided abdominal pain, increased shortness of breath since he fell on 12/07/2023; now a new pancreatic mass and right-sided 4 rib fractures and L1 compression fracture    PMH significant for HTN, DM, HLD, hypothyroidism, NASH, CKD provide appendectomy and hernia repair   OT comments  Pt seen for OT treatment this date. Personal aide present and reporting pt does not seem at his functional/cognitive baseline; pt required significant physical assist from aide to transfer from bed to recliner and required assist to feed self. Pt presents with significantly delayed processing and response time, does not respond to commands consistently and performs movements with slow motor planning / initiation. Pt stands from recliner with MOD A +1-2 using RW, voices need for urgent BM but unable to transfer to [facility] on time. Posterior lean in standing, required max multimodal cuing for upright cervical posture with narrow BOS. Able to correct given time and cues. MAX A for standing pericare, tolerates standing for ~5 mins. Daughter called and spoke to this author, voicing concerns re: impact of pt's current medications on overall status. Secure chat to MD and care team with daughter's concerns. No focal deficits observed - pt has facial symmetry and equal grip strength. OT will continue to follow, discharge recommendation appropraite.       If plan is discharge home, recommend the following:  A lot of help with walking and/or transfers;A lot of help with bathing/dressing/bathroom   Equipment Recommendations  Other (comment)       Precautions / Restrictions Precautions Precautions: Fall;Back Restrictions Weight Bearing Restrictions Per Provider Order: No       Mobility Bed Mobility Overal bed mobility:  Needs Assistance             General bed mobility comments: NT, pt recieved and left in recliner    Transfers Overall transfer level: Needs assistance Equipment used: Rolling walker (2 wheels) Transfers: Sit to/from Stand Sit to Stand: Mod assist           General transfer comment: STS from recliner, requires excessive time and cuing for hand placement, pt unable to scoot hips foward in recliner, max A with use of pad, narrow BOS in standing     Balance Overall balance assessment: Needs assistance Sitting-balance support: Feet supported Sitting balance-Leahy Scale: Fair   Postural control: Posterior lean Standing balance support: Bilateral upper extremity supported, During functional activity, Reliant on assistive device for balance Standing balance-Leahy Scale: Poor Standing balance comment: posterior lean in standing; maxA with multimodal cues to correct, pt stands for 5 mins for BM/pericare                           ADL either performed or assessed with clinical judgement   ADL Overall ADL's : Needs assistance/impaired   Eating/Feeding Details (indicate cue type and reason): personal aide in room reports she had to feed pt his breakfast, pt seemed more confused than normal                         Toileting- Clothing Manipulation and Hygiene: Maximal assistance;Sit to/from stand Toileting - Clothing Manipulation Details (indicate cue type and reason): pt voiced need for urgent BM; unable to get to [facility] in time, maxA for standing pericare  Functional mobility during ADLs: Moderate assistance;+2 for physical assistance;+2 for safety/equipment;Rolling walker (2 wheels) General ADL Comments: caregiver in room providing assist for pericare after BM     Communication Communication Communication: Impaired Factors Affecting Communication: Reduced clarity of speech (increased secretions)   Cognition Arousal: Alert Behavior During Therapy: Flat  affect Cognition: Cognition impaired   Orientation impairments:  (when given time, and choice of two years, pt A&Ox4.) Awareness: Intellectual awareness impaired, Online awareness impaired   Attention impairment (select first level of impairment): Focused attention Executive functioning impairment (select all impairments): Problem solving, Reasoning, Initiation OT - Cognition Comments: pt with overall flat affect, takes excessive time to process and respond to directions/questions. does not follow commands consistently. CNA and daughter (over phone) reporting pt does not seem at functional baseline.                 Following commands: Impaired Following commands impaired: Follows one step commands with increased time, Follows one step commands inconsistently      Cueing   Cueing Techniques: Verbal cues        General Comments Noted bruising on pt's back from fall    Pertinent Vitals/ Pain       Pain Assessment Pain Assessment: No/denies pain Pain Score: 0-No pain   Frequency  Min 2X/week        Progress Toward Goals  OT Goals(current goals can now be found in the care plan section)  Progress towards OT goals: Progressing toward goals  Acute Rehab OT Goals OT Goal Formulation: With patient/family Time For Goal Achievement: 12/31/23 Potential to Achieve Goals: Good ADL Goals Pt Will Perform Grooming: with modified independence;sitting;standing Pt Will Perform Lower Body Dressing: with modified independence;sitting/lateral leans;sit to/from stand Pt Will Transfer to Toilet: with modified independence;ambulating Pt Will Perform Toileting - Clothing Manipulation and hygiene: with modified independence;sit to/from stand;sitting/lateral leans  Plan         AM-PAC OT 6 Clicks Daily Activity     Outcome Measure   Help from another person eating meals?: A Little Help from another person taking care of personal grooming?: A Little Help from another person  toileting, which includes using toliet, bedpan, or urinal?: A Lot Help from another person bathing (including washing, rinsing, drying)?: A Lot Help from another person to put on and taking off regular upper body clothing?: A Little Help from another person to put on and taking off regular lower body clothing?: A Lot 6 Click Score: 15    End of Session Equipment Utilized During Treatment: Rolling walker (2 wheels);Gait belt  OT Visit Diagnosis: Other abnormalities of gait and mobility (R26.89);Muscle weakness (generalized) (M62.81);History of falling (Z91.81)   Activity Tolerance Patient tolerated treatment well   Patient Left in chair;with call bell/phone within reach;with nursing/sitter in room;with family/visitor present;with chair alarm set   Nurse Communication Mobility status;Other (comment) (BM, discussion with daughter)        Time: 8889-8861 OT Time Calculation (min): 28 min  Charges: OT General Charges $OT Visit: 1 Visit OT Treatments $Self Care/Home Management : 23-37 mins  Kieon Lawhorn L. [place] Ndiaye, OTR/L  12/21/23, 12:50 PM

## 2023-12-21 NOTE — Consult Note (Addendum)
 WOC Nurse Consult Note: Reason for Consult: Consult requested for buttocks/sacrum.  Secure chat message sent to the bedside nurse as follows, They want me to assess this patient's buttocks. I came but he was up in the chair. Can you let me know later when he is back and bed and I will come then?  1430 Post note: Pt wants to remain in the chair. Pt's daughter assisted with standing patient to assess buttocks.  She states the area was present prior to admission and they have been applying zinc  oxide cream. Left buttock with red moist stage 2 pressure injury near the inner gluteal fold, 1.2X1.2X.1cm. Pt voided 200cc in the urinal then was assisted to sit back down in the chahir with the chair pad alarm on.  Topical treatment orders provided for bedside nurses to perform as follows to protect from further injury: Apply Desitin to inner buttocks Q day and PRN when turning or cleaning.  Apply foam dressing to protect the location, change foam dressing Q 3 days or PRN if soiled  Please re-consult if further assistance is needed.  Thank-you,  Stephane Fought MSN, RN, CWOCN, CWCN-AP, CNS Contact Mon-Fri 0700-1500: [phone]

## 2023-12-21 NOTE — Progress Notes (Signed)
 Physical Therapy Treatment Patient Details Name: Charles Wilkinson MRN: [id number] DOB: 07-Oct-1945 Today's Date: 12/21/2023   History of Present Illness Pt is a 78 year old male presented to ED complaining of right-sided abdominal pain, increased shortness of breath since he fell on 12/07/2023; now a new pancreatic mass and right-sided 4 rib fractures and L1 compression fracture    PMH significant for HTN, DM, HLD, hypothyroidism, NASH, CKD provide appendectomy and hernia repair    PT Comments  Pt seen for PT tx with pt agreeable. Session focused on technique for increased ease of sit>stand transfers with pt requiring min assist from low recliner. Pt is able to ambulate short distance in room with RW & CGA<>min assist, decreased gait speed, gait pattern as noted below. Continue to recommend post acute rehab <3 hours therapy/day upon d/c.    If plan is discharge home, recommend the following: A little help with walking and/or transfers;A little help with bathing/dressing/bathroom;Assistance with cooking/housework;Assist for transportation;Help with stairs or ramp for entrance   Can travel by private vehicle     Yes  Equipment Recommendations  Other (comment) (defer to next venue)    Recommendations for Other Services Rehab consult     Precautions / Restrictions Precautions Precautions: Fall;Back Precaution/Restrictions Comments: R rib fxs Restrictions Weight Bearing Restrictions Per Provider Order: No     Mobility  Bed Mobility               General bed mobility comments: not tested, pt received & left sitting in recliner    Transfers Overall transfer level: Needs assistance Equipment used: Rolling walker (2 wheels) Transfers: Sit to/from Stand             General transfer comment: cuing to scoot out to edge of recliner seat, BLE feet underneath him, hand placement, anterior weight shift with pt able to transfer sit>stand with min assist, good eccentric control with  stand>sit.    Ambulation/Gait Ambulation/Gait assistance: Min assist, Contact guard assist Gait Distance (Feet): 30 Feet (+ 35 ft) Assistive device: Rolling walker (2 wheels) Gait Pattern/deviations: Decreased step length - right, Decreased step length - left, Decreased dorsiflexion - right, Decreased dorsiflexion - left, Decreased stride length, Narrow base of support Gait velocity: decreased     General Gait Details: At one point pt with L foot rotated to R, cuing for forward direction   Stairs             Wheelchair Mobility     Tilt Bed    Modified Rankin (Stroke Patients Only)       Balance Overall balance assessment: Needs assistance Sitting-balance support: Feet supported Sitting balance-Leahy Scale: Fair     Standing balance support: Bilateral upper extremity supported, During functional activity, Reliant on assistive device for balance Standing balance-Leahy Scale: Fair                              Hotel manager: Impaired Factors Affecting Communication: Reduced clarity of speech  Cognition Arousal: Alert Behavior During Therapy: Flat affect   PT - Cognitive impairments: Safety/Judgement                       PT - Cognition Comments: decreased awareness of decreased ability to manage secretions Following commands: Impaired Following commands impaired: Follows one step commands with increased time    Cueing Cueing Techniques: Verbal cues  Exercises      General Comments  General comments (skin integrity, edema, etc.): Reviewed use of incentive spirometer with pt demonstrating fair<>good return demo; encouraged him to use it often (every commercial break on TV)      Pertinent Vitals/Pain Pain Assessment Pain Assessment: Faces Faces Pain Scale: Hurts a little bit Pain Location: R rib fx Pain Descriptors / Indicators: Dull, Aching Pain Intervention(s): Limited activity within patient's  tolerance, Monitored during session    Home Living                          Prior Function            PT Goals (current goals can now be found in the care plan section) Acute Rehab PT Goals Patient Stated Goal: decreased pain, get better, go to rehab PT Goal Formulation: With patient/family Time For Goal Achievement: 12/31/23 Potential to Achieve Goals: Good Progress towards PT goals: Progressing toward goals    Frequency    Min 2X/week      PT Plan      Co-evaluation              AM-PAC PT 6 Clicks Mobility   Outcome Measure  Help needed turning from your back to your side while in a flat bed without using bedrails?: A Little Help needed moving from lying on your back to sitting on the side of a flat bed without using bedrails?: A Lot Help needed moving to and from a bed to a chair (including a wheelchair)?: A Little Help needed standing up from a chair using your arms (e.g., wheelchair or bedside chair)?: A Little Help needed to walk in hospital room?: A Little Help needed climbing 3-5 steps with a railing? : A Lot 6 Click Score: 16    End of Session   Activity Tolerance: Patient tolerated treatment well;Patient limited by fatigue Patient left: in chair;with call bell/phone within reach;with chair alarm set Nurse Communication: Mobility status PT Visit Diagnosis: Unsteadiness on feet (R26.81);Difficulty in walking, not elsewhere classified (R26.2);Other abnormalities of gait and mobility (R26.89);Muscle weakness (generalized) (M62.81);Pain Pain - Right/Left: Right Pain - part of body:  (ribs)     Time: 8551-8487 PT Time Calculation (min) (ACUTE ONLY): 24 min  Charges:    $Therapeutic Activity: 23-37 mins PT General Charges $$ ACUTE PT VISIT: 1 Visit                     Richerd Pinal, PT, DPT 12/21/23, 3:19 PM   Richerd CHRISTELLA Pinal 12/21/2023, 3:18 PM

## 2023-12-21 NOTE — Progress Notes (Signed)
 PROGRESS NOTE    Charles Wilkinson  FMW:[id number] DOB: 04-20-1945 DOA: 12/17/2023 PCP: Epifanio Alm SQUIBB, MD    Brief Narrative:   Charles Wilkinson is a pleasant 78 y.o. male with medical history significant for HTN, DM, HLD, hypothyroidism, NASH, CKD provide appendectomy and hernia repair who presented to ED complaining of right-sided abdominal pain, increased shortness of breath since he fell on 12/07/2023.  He attended ER on 12/07/2023 and diagnosed with a rib fracture.  Since then his daughter and caregiver advised me that he has some shortness of breath and abdominal pain and not able to take care of him at home.  He has a 24/7 coverage however it is increasingly difficult for him to get around at home due to abdominal pain and shortness of breath.  He and his family denied any new injuries or falls.  He is on aspirin  but not on any other anticoagulations.  He has been using walker to ambulate.  He states that he lives in a assisted living facility.  There is no fever, chills, cough, palpitations, nausea, vomiting, diarrhea, urinary symptoms.     Assessment & Plan:   Principal Problem:   Multiple rib fractures Active Problems:   Stroke [facility])   Hyperlipidemia   Fall   Stage 3a chronic kidney disease (HCC)   Type 2 diabetes mellitus with hyperlipidemia (HCC)   Essential hypertension   NASH (nonalcoholic steatohepatitis)   Hypothyroidism   Pancreatic mass   Closed rib fracture   Pressure injury of skin   Parkinson's disease (HCC)  # Right 8-12 rib fractures No pneumonia, hemothorax, pneumothorax. CTA no PE. Pain is controlled. Has significant ecchymoses right flank extending down right posterior leg. Ct of pelvis was neg for fracture.  - pain control, bowel regimen (had bm today). Patient reports feeling drugged so will continue standing tramadol  but make oxy prn - PT/OT advising snf, toc consulted - IS - check CBC to ensure stability given significant bruising  # L1 transverse  process fracture # Chronic vertebral compression fractures No significant pain there - PT/OT as above  # Hyponatremia Mild initially and is and chronic but today worsened to 122. Home diuretics? Labs suggestive of hypovolemia so diuretics held yesterday and challenged with 1 liter NS but sodium has decreased. 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 Prior imaging not available for comparison. LFTs normal. Discussed with Dr. Therisa of GI, he advises outpatient GI f/u (will need referral or to establish with GI through the [facility]). Outpatient provider will need to attempt to obtain prior imaging for comparison. If sufficient concern for malignancy would likely need EUS and biopsy. Admitter has ordered ca 19-9 and AFP.  - outpatient GI f/u - monitor LFTs here  # T2DM Euglycemic - SSI for now - home jardiance, glipizide, januvia, metformin on hold  # Parkinson's disease Doesn't appear to be on any specific agents  # NASH cirrhosis # Thrombocytopenia With evidence splenomegaly and abdominal varices on CT imaging. Compensated. With thrombocytopenia. INR wnl, LFTs wnl - home spiro, lasix  on hold as below  # CKD 3a Stable - monitor  # History CVA - home asa, not on statin at home  # HTN Bp controlled - home losartan  - spiro/lasix   on hold as above  # Hypothyroid TFTs are odd, with elevated tsh and free t4. Tsh has been normal in past on patient's synthroid  so tsh-producing adenoma sounds less likely - home synthroid  - f/u endo   DVT prophylaxis: lovenox  Code Status: full Family Communication: daughter [name] updated at bedside 9/13, no answer when called. Daughter kristin updated telephonically 9/15  Level of  care: Med-Surg Status is: Observation    Consultants:  none  Procedures: none  Antimicrobials:  none    Subjective: Pain much improved. Feels like pain meds are too strong. No cough or dyspnea.   Objective: Vitals:   12/20/23 1711 12/20/23 1937 12/21/23 0453 12/21/23 0820  BP: (!) 142/81 132/75 132/75 116/67  Pulse: 70 77 76 81  Resp: 18 16 16 16   Temp: 97.6 F (36.4 C) 98.1 F (36.7 C)  97.6 F (36.4 C)  TempSrc: Oral Oral    SpO2: 98% 97% 97% 94%  Weight:      Height:        Intake/Output Summary (Last 24 hours) at 12/21/2023 1603 Last data filed at 12/21/2023 1408 Gross per 24 hour  Intake 240 ml  Output 1625 ml  Net -1385 ml   Filed Weights   12/17/23 0425  Weight: 79.2 kg    Examination:  General exam: Appears calm and comfortable  Respiratory system: Clear to auscultation save for rales at bases Cardiovascular system: S1 & S2 heard, RRR.   Gastrointestinal system: Abdomen is nondistended, soft and nontender.   Central nervous system: Alert and oriented. Non-focal exam Extremities: warm, trace LE edema Skin: bruising right flank and right thigh Psychiatry: Judgement and insight appear normal. Mood & affect appropriate.     Data Reviewed: I have personally reviewed following labs and imaging studies  CBC: Recent Labs  Lab 12/17/23 0431 12/18/23 0556 12/19/23 0511  WBC 3.5* 3.0* 4.8  HGB 13.1 12.4* 11.4*  HCT 39.1 35.4* 32.1*  MCV 92.9 90.8 89.9  PLT 90* 82* 84*   Basic Metabolic Panel: Recent Labs  Lab 12/17/23 0431 12/18/23 0556 12/19/23 0511 12/20/23 0539 12/21/23 0314  NA 130* 130* 126* 125* 122*  K 4.4 4.2 4.3 4.4 4.4  CL 96* 100 95* 92* 91*  CO2 25 24 24 24 22   GLUCOSE 134* 113* 142* 148* 135*  BUN 35* 33* 39* 37* 32*  CREATININE 1.65* 1.52* 1.76* 1.37* 1.19  CALCIUM  9.1 8.8* 8.5* 8.7* 8.5*   GFR: Estimated Creatinine Clearance: 52 mL/min (by C-G formula based on SCr of 1.19 mg/dL). Liver Function Tests: Recent Labs   Lab 12/17/23 0431 12/18/23 0556  AST 29 24  ALT 30 27  ALKPHOS 120 90  BILITOT 1.7* 2.1*  PROT 7.4 6.3*  ALBUMIN  3.6 3.2*   Recent Labs  Lab 12/17/23 0431  LIPASE 47   No results for input(s): AMMONIA in the last 168 hours. Coagulation Profile: Recent Labs  Lab 12/17/23 0450 12/18/23 0556  INR 1.0 1.1   Cardiac Enzymes: No results for input(s): CKTOTAL, CKMB, CKMBINDEX, TROPONINI in the last 168 hours. BNP (last 3 results) No results for input(s): PROBNP in the last 8760 hours. HbA1C: No results for input(s): HGBA1C in the last 72 hours.  CBG: Recent Labs  Lab 12/20/23 1153 12/20/23 1646 12/20/23 2204 12/21/23 0823 12/21/23 1154  GLUCAP 154* 162* 149* 154* 216*   Lipid Profile: No results for input(s): CHOL, HDL, LDLCALC, TRIG, CHOLHDL, LDLDIRECT in the last 72 hours. Thyroid  Function Tests: Recent Labs    12/19/23  0511  TSH 18.886*  FREET4 1.14*   Anemia Panel: No results for input(s): VITAMINB12, FOLATE, FERRITIN, TIBC, IRON, RETICCTPCT in the last 72 hours. Urine analysis:    Component Value Date/Time   COLORURINE STRAW (A) 12/17/2023 0431   APPEARANCEUR CLEAR (A) 12/17/2023 0431   LABSPEC 1.005 12/17/2023 0431   PHURINE 7.0 12/17/2023 0431   GLUCOSEU >=500 (A) 12/17/2023 0431   HGBUR NEGATIVE 12/17/2023 0431   BILIRUBINUR NEGATIVE 12/17/2023 0431   KETONESUR NEGATIVE 12/17/2023 0431   PROTEINUR NEGATIVE 12/17/2023 0431   NITRITE NEGATIVE 12/17/2023 0431   LEUKOCYTESUR NEGATIVE 12/17/2023 0431   Sepsis Labs: @LABRCNTIP (procalcitonin:4,lacticidven:4)  ) Recent Results (from the past 240 hours)  Resp panel by RT-PCR (RSV, Flu A&B, Covid) Anterior Nasal Swab     Status: None   Collection Time: 12/17/23  4:58 AM   Specimen: Anterior Nasal Swab  Result Value Ref Range Status   SARS Coronavirus 2 by RT PCR NEGATIVE NEGATIVE Final    Comment: (NOTE) SARS-CoV-2 target nucleic acids are NOT DETECTED.  The  SARS-CoV-2 RNA is generally detectable in upper respiratory specimens during the acute phase of infection. The lowest concentration of SARS-CoV-2 viral copies this assay can detect is 138 copies/mL. A negative result does not preclude SARS-Cov-2 infection and should not be used as the sole basis for treatment or other patient management decisions. A negative result may occur with  improper specimen collection/handling, submission of specimen other than nasopharyngeal swab, presence of viral mutation(s) within the areas targeted by this assay, and inadequate number of viral copies(<138 copies/mL). A negative result must be combined with clinical observations, patient history, and epidemiological information. The expected result is Negative.  Fact Sheet for Patients:  BloggerCourse.com  Fact Sheet for Healthcare Providers:  SeriousBroker.it  This test is no t yet approved or cleared by the [place]  FDA and  has been authorized for detection and/or diagnosis of SARS-CoV-2 by FDA under an Emergency Use Authorization (EUA). This EUA will remain  in effect (meaning this test can be used) for the duration of the COVID-19 declaration under Section 564(b)(1) of the Act, 21 U.S.C.section 360bbb-3(b)(1), unless the authorization is terminated  or revoked sooner.       Influenza A by PCR NEGATIVE NEGATIVE Final   Influenza B by PCR NEGATIVE NEGATIVE Final    Comment: (NOTE) The Xpert Xpress SARS-CoV-2/FLU/RSV plus assay is intended as an aid in the diagnosis of influenza from Nasopharyngeal swab specimens and should not be used as a sole basis for treatment. Nasal washings and aspirates are unacceptable for Xpert Xpress SARS-CoV-2/FLU/RSV testing.  Fact Sheet for Patients: BloggerCourse.com  Fact Sheet for Healthcare Providers: SeriousBroker.it  This test is not yet approved or  cleared by the [place]  FDA and has been authorized for detection and/or diagnosis of SARS-CoV-2 by FDA under an Emergency Use Authorization (EUA). This EUA will remain in effect (meaning this test can be used) for the duration of the COVID-19 declaration under Section 564(b)(1) of the Act, 21 U.S.C. section 360bbb-3(b)(1), unless the authorization is terminated or revoked.     Resp Syncytial Virus by PCR NEGATIVE NEGATIVE Final    Comment: (NOTE) Fact Sheet for Patients: BloggerCourse.com  Fact Sheet for Healthcare Providers: SeriousBroker.it  This test is not yet approved or cleared by the [place]  FDA and has been authorized for detection and/or diagnosis of SARS-CoV-2 by FDA under an Emergency Use Authorization (EUA). This EUA will remain in effect (meaning this test can be used) for  the duration of the COVID-19 declaration under Section 564(b)(1) of the Act, 21 U.S.C. section 360bbb-3(b)(1), unless the authorization is terminated or revoked.  Performed at [facility], [address], [place], [place] [address]          Radiology Studies: No results found.       Scheduled Meds:  acetaminophen   500 mg Oral BID   aspirin  EC  81 mg Oral Daily   [START ON 12/22/2023] cosyntropin   0.25 mg Intravenous Once   ferrous sulfate   325 mg Oral Q breakfast   insulin  aspart  0-5 Units Subcutaneous QHS   insulin  aspart  0-6 Units Subcutaneous TID WC   latanoprost   1 drop Both Eyes QHS   levothyroxine   150 mcg Oral Q0600   liver oil-zinc  oxide   Topical 1 day or 1 dose   losartan   25 mg Oral Daily   oxyCODONE   2.5 mg Oral BID   pantoprazole   40 mg Oral BID   polyethylene glycol  34 g Oral Daily   senna  1 tablet Oral QHS   sodium chloride   1 g Oral TID WC   traMADol   50 mg Oral BID   Continuous Infusions:     LOS: 1 day     Devaughn KATHEE Ban, MD [facility]   If 7PM-7AM, please contact  night-coverage [email or web address] Password [email or web address] 12/21/2023, 4:03 PM

## 2023-12-22 DIAGNOSIS — S2241XA Multiple fractures of ribs, right side, initial encounter for closed fracture: Secondary | ICD-10-CM | POA: Diagnosis not present

## 2023-12-22 LAB — BASIC METABOLIC PANEL WITH GFR
Anion gap: 8 (ref 5–15)
BUN: 35 mg/dL — ABNORMAL HIGH (ref 8–23)
CO2: 23 mmol/L (ref 22–32)
Calcium: 8.7 mg/dL — ABNORMAL LOW (ref 8.9–10.3)
Chloride: 90 mmol/L — ABNORMAL LOW (ref 98–111)
Creatinine, Ser: 1.36 mg/dL — ABNORMAL HIGH (ref 0.61–1.24)
GFR, Estimated: 53 mL/min — ABNORMAL LOW (ref >60)
Glucose, Bld: 142 mg/dL — ABNORMAL HIGH (ref 70–99)
Potassium: 4.2 mmol/L (ref 3.5–5.1)
Sodium: 121 mmol/L — ABNORMAL LOW (ref 135–145)

## 2023-12-22 LAB — GLUCOSE, CAPILLARY
Glucose-Capillary: 130 mg/dL — ABNORMAL HIGH (ref 70–99)
Glucose-Capillary: 235 mg/dL — ABNORMAL HIGH (ref 70–99)
Glucose-Capillary: 190 mg/dL — ABNORMAL HIGH (ref 70–99)
Glucose-Capillary: 183 mg/dL — ABNORMAL HIGH (ref 70–99)

## 2023-12-22 LAB — CBC
HCT: 36.7 % — ABNORMAL LOW (ref 39.0–52.0)
Hemoglobin: 13 g/dL (ref 13.0–17.0)
MCH: 31.5 pg (ref 26.0–34.0)
MCHC: 35.4 g/dL (ref 30.0–36.0)
MCV: 88.9 fL (ref 80.0–100.0)
Platelets: 113 K/uL — ABNORMAL LOW (ref 150–400)
RBC: 4.13 MIL/uL — ABNORMAL LOW (ref 4.22–5.81)
RDW: 14.3 % (ref 11.5–15.5)
WBC: 4 K/uL (ref 4.0–10.5)
nRBC: 0 % (ref 0.0–0.2)

## 2023-12-22 MED ORDER — COSYNTROPIN 0.25 MG IJ SOLR
0.2500 mg | Freq: Once | INTRAMUSCULAR | Status: AC
  Administered 2023-12-23: 0.25 mg via INTRAVENOUS
  Filled 2023-12-22: qty 0.25

## 2023-12-22 MED ORDER — COSYNTROPIN NICU IV SYRINGE 0.25 MG/ML (STANDARD DOSE): 0.2500 mg | Freq: Once | INTRAVENOUS | Status: DC

## 2023-12-22 MED ORDER — GLUCERNA SHAKE PO LIQD
237.0000 mL | Freq: Two times a day (BID) | ORAL | Status: DC
Start: 2023-12-22 — End: 2023-12-24
  Administered 2023-12-22 – 2023-12-24 (×4): 237 mL via ORAL

## 2023-12-22 MED ORDER — ALBUMIN HUMAN 25 % IV SOLN
25.0000 g | Freq: Once | INTRAVENOUS | Status: AC
  Administered 2023-12-22: 25 g via INTRAVENOUS
  Filled 2023-12-22: qty 100

## 2023-12-22 MED ORDER — FUROSEMIDE 20 MG PO TABS
20.0000 mg | ORAL_TABLET | Freq: Every day | ORAL | Status: DC
  Administered 2023-12-22 – 2023-12-24 (×3): 20 mg via ORAL
  Filled 2023-12-22 (×3): qty 1

## 2023-12-22 MED ORDER — SPIRONOLACTONE 25 MG PO TABS
25.0000 mg | ORAL_TABLET | Freq: Every day | ORAL | Status: DC
  Administered 2023-12-22 – 2023-12-24 (×3): 25 mg via ORAL
  Filled 2023-12-22 (×3): qty 1

## 2023-12-22 NOTE — Consult Note (Signed)
 [facility] Consult Note:12/22/23     Date of Admission:  12/17/2023           Reason for Consult:  hyponatremia   Referring Provider: Kandis Devaughn Sayres, MD Primary Care Provider: Epifanio Alm SQUIBB, MD   History of Presenting Illness:  Charles Wilkinson is a 78 y.o. male  with medical problems of hypertension, diabetes, hyperlipidemia, hypothyroidism, NASH, CKD presented to the emergency room for right-sided abdominal pain and increasing shortness of breath. He was diagnosed with right rib fractures.  He has history of falls at home more recently. Nephrology consult has been requested for evaluation of hyponatremia. Review of records indicate that his sodium level was normal November 2023. Since December 17, 2023, his DM has been low and has decreased to 121 during hospitalization. 2D echo from April 2023 indicates a normal LV EF 60 to 65%, normal right ventricular systolic function, no mitral regurgitation or stenosis, no aortic stenosis.  Patient did have a CT abdomen and pelvis with contrast on 12/17/2023 which showed cystic mass of the pancreatic head up to 4.7 cm question chronic pancreatitis, superimposed cirrhosis, portal hypertension, fracture of the right 12th rib and right L1 transverse process, moderate to severe T12 and L1 compression fractures likely chronic, aortic atherosclerosis and infrarenal aortic abdominal aneurysm up to 3.8 cm.   Review of Systems: Review of Systems  Constitutional:  Positive for malaise/fatigue. Negative for chills and fever.  HENT:  Negative for hearing loss.   Eyes:  Negative for blurred vision.  Respiratory:  Negative for cough, sputum production and shortness of breath.   Cardiovascular:  Negative for chest pain and leg swelling.  Gastrointestinal:  Negative for heartburn, nausea and vomiting.  Genitourinary:  Negative for dysuria and urgency.  Musculoskeletal:  Positive for back pain. Negative for myalgias.  Skin:   Negative for rash.  Neurological:  Negative for dizziness.  Endo/Heme/Allergies:  Does not bruise/bleed easily.  Psychiatric/Behavioral:  The patient is not nervous/anxious.     Past Medical History:  Diagnosis Date   Diabetes mellitus without complication (HCC)    GERD (gastroesophageal reflux disease)    Hyperlipidemia    Hypertension    Hypothyroidism    NASH (nonalcoholic steatohepatitis)     Social History   Tobacco Use   Smoking status: Former    Types: Cigarettes   Smokeless tobacco: Never  Vaping Use   Vaping status: Never Used  Substance Use Topics   Alcohol use: Not Currently   Drug use: Never    Family History  Problem Relation Age of Onset   CAD Mother    Diabetes Mother      OBJECTIVE: Blood pressure 132/64, pulse 66, temperature 97.8 F (36.6 C), resp. rate 16, height 5' 10 (1.778 m), weight 79.2 kg, SpO2 96%.  Physical Exam General Appearance-frail, elderly gentleman, lying in the bed HEENT-moist oral mucous membranes Pulmonary-normal breathing effort, limited exam but clear to auscultation Cardiac-no rub Abdomen-soft, nondistended Extremities-wearing compression socks, on no significant edema noted Foley catheter in place Normal turgor, no acute rashes Neuro-able to follow commands Foley catheter in place  Lab Results Lab Results  Component Value Date   WBC 4.0 12/22/2023   HGB 13.0 12/22/2023   HCT 36.7 (L) 12/22/2023   MCV 88.9 12/22/2023   PLT 113 (L) 12/22/2023    Lab Results  Component Value Date   CREATININE 1.36 (H) 12/22/2023   BUN 35 (H) 12/22/2023   NA 121 (L) 12/22/2023   K 4.2  12/22/2023   CL 90 (L) 12/22/2023   CO2 23 12/22/2023    Lab Results  Component Value Date   ALT 27 12/18/2023   AST 24 12/18/2023   ALKPHOS 90 12/18/2023   BILITOT 2.1 (H) 12/18/2023     Microbiology: Recent Results (from the past 240 hours)  Resp panel by RT-PCR (RSV, Flu A&B, Covid) Anterior Nasal Swab     Status: None   Collection  Time: 12/17/23  4:58 AM   Specimen: Anterior Nasal Swab  Result Value Ref Range Status   SARS Coronavirus 2 by RT PCR NEGATIVE NEGATIVE Final    Comment: (NOTE) SARS-CoV-2 target nucleic acids are NOT DETECTED.  The SARS-CoV-2 RNA is generally detectable in upper respiratory specimens during the acute phase of infection. The lowest concentration of SARS-CoV-2 viral copies this assay can detect is 138 copies/mL. A negative result does not preclude SARS-Cov-2 infection and should not be used as the sole basis for treatment or other patient management decisions. A negative result may occur with  improper specimen collection/handling, submission of specimen other than nasopharyngeal swab, presence of viral mutation(s) within the areas targeted by this assay, and inadequate number of viral copies(<138 copies/mL). A negative result must be combined with clinical observations, patient history, and epidemiological information. The expected result is Negative.  Fact Sheet for Patients:  BloggerCourse.com  Fact Sheet for Healthcare Providers:  SeriousBroker.it  This test is no t yet approved or cleared by the [place]  FDA and  has been authorized for detection and/or diagnosis of SARS-CoV-2 by FDA under an Emergency Use Authorization (EUA). This EUA will remain  in effect (meaning this test can be used) for the duration of the COVID-19 declaration under Section 564(b)(1) of the Act, 21 U.S.C.section 360bbb-3(b)(1), unless the authorization is terminated  or revoked sooner.       Influenza A by PCR NEGATIVE NEGATIVE Final   Influenza B by PCR NEGATIVE NEGATIVE Final    Comment: (NOTE) The Xpert Xpress SARS-CoV-2/FLU/RSV plus assay is intended as an aid in the diagnosis of influenza from Nasopharyngeal swab specimens and should not be used as a sole basis for treatment. Nasal washings and aspirates are unacceptable for Xpert Xpress  SARS-CoV-2/FLU/RSV testing.  Fact Sheet for Patients: BloggerCourse.com  Fact Sheet for Healthcare Providers: SeriousBroker.it  This test is not yet approved or cleared by the [place]  FDA and has been authorized for detection and/or diagnosis of SARS-CoV-2 by FDA under an Emergency Use Authorization (EUA). This EUA will remain in effect (meaning this test can be used) for the duration of the COVID-19 declaration under Section 564(b)(1) of the Act, 21 U.S.C. section 360bbb-3(b)(1), unless the authorization is terminated or revoked.     Resp Syncytial Virus by PCR NEGATIVE NEGATIVE Final    Comment: (NOTE) Fact Sheet for Patients: BloggerCourse.com  Fact Sheet for Healthcare Providers: SeriousBroker.it  This test is not yet approved or cleared by the [place]  FDA and has been authorized for detection and/or diagnosis of SARS-CoV-2 by FDA under an Emergency Use Authorization (EUA). This EUA will remain in effect (meaning this test can be used) for the duration of the COVID-19 declaration under Section 564(b)(1) of the Act, 21 U.S.C. section 360bbb-3(b)(1), unless the authorization is terminated or revoked.  Performed at [facility], [address] [place], [place] [address]     Medications: Scheduled Meds:  acetaminophen   500 mg Oral BID   aspirin  EC  81 mg Oral Daily   [START ON  12/23/2023] cosyntropin   0.25 mg Intravenous Once   feeding supplement (GLUCERNA SHAKE)  237 mL Oral BID BM   ferrous sulfate   325 mg Oral Q breakfast   furosemide   20 mg Oral Daily   insulin  aspart  0-5 Units Subcutaneous QHS   insulin  aspart  0-6 Units Subcutaneous TID WC   latanoprost   1 drop Both Eyes QHS   levothyroxine   150 mcg Oral Q0600   pantoprazole   40 mg Oral BID   polyethylene glycol  34 g Oral Daily   senna  1 tablet Oral QHS   sodium chloride   1 g Oral TID  WC   spironolactone   25 mg Oral Daily   traMADol   50 mg Oral BID   Continuous Infusions: PRN Meds:.acetaminophen  **OR** acetaminophen , ondansetron  **OR** ondansetron  (ZOFRAN ) IV, oxyCODONE   No Known Allergies  Urinalysis: No results for input(s): COLORURINE, LABSPEC, PHURINE, GLUCOSEU, HGBUR, BILIRUBINUR, KETONESUR, PROTEINUR, UROBILINOGEN, NITRITE, LEUKOCYTESUR in the last 72 hours.  Invalid input(s): APPERANCEUR    Imaging: No results found.    Assessment/Plan:  Isaack Preble is a 78 y.o. male with medical problems of  Hypertension, diabetes, hyperlipidemia, hypothyroidism, NASH, CKD, history of falls, pancreatic mass, portal hypertension, cirrhosis, right rib fracture, right L1 transverse process fracture, T12 and L1 compression fractures, infrarenal abdominal aneurysm, aortic atherosclerosis was admitted on 12/17/2023 for :  Shortness of breath [R06.02] Fall [W19.XXXA] Right sided abdominal pain [R10.9] Closed fracture of multiple ribs of right side with routine healing, subsequent encounter [S22.41XD]  1.  Hyponatremia Last known normal sodium was in November 2024.  Sodium of 135 on October 08, 2023 which may be his baseline. Admission sodium was 130 which has progressively worsened. This is likely multifactorial.  Patient received IV contrast at admission which may have caused AKI and fluid retention leading to worsening of hyponatremia.  His urine output is good now.  Sodium is expected to improve.  Other contributing factor is likely hypothyroidism with TSH of 18.9 Recommend reasonable fluid restriction of up to 1500 cc/day. Encourage patient to drink protein shakes instead of plain water/tea. Will monitor sodium during hospitalization.  2.  Acute kidney injury Likely secondary to IV contrast. Creatinine peaked at 1.7 but is now improved. Baseline creatinine of 1.2-1.3 (underlying CKD 3A)  3.  Diabetes type 2 with CKD Hemoglobin A1c 7.8% from July  2025. Management as per primary team.   Saralee Stank 12/22/23

## 2023-12-22 NOTE — Progress Notes (Signed)
 Mobility Specialist - Progress Note     12/22/23 1409  Mobility  Activity Stood at bedside;Ambulated with assistance;Pivoted/transferred from bed to chair  Level of Assistance Minimal assist, patient does 75% or more  Assistive Device Front wheel walker  Distance Ambulated (ft) 6 ft  Range of Motion/Exercises Active  Activity Response Tolerated well  Mobility Referral Yes  Mobility visit 1 Mobility  Mobility Specialist Start Time (ACUTE ONLY) 1359  Mobility Specialist Stop Time (ACUTE ONLY) 1409  Mobility Specialist Time Calculation (min) (ACUTE ONLY) 10 min   Pt resting on BSC upon entry. Pt STS Min/ModA and ambulates to chair from past end of bed. Pt left in recliner with needs in reach and chair alarm activated.   Guido Rumble Mobility Specialist 12/22/23, 2:15 PM

## 2023-12-22 NOTE — Progress Notes (Addendum)
 PROGRESS NOTE    Charles Wilkinson  FMW:[id number] DOB: [birth year] DOA: 12/17/2023 PCP: Epifanio Alm SQUIBB, MD    Brief Narrative:   Charles Wilkinson is a pleasant 78 y.o. male with medical history significant for HTN, DM, HLD, hypothyroidism, NASH, CKD provide appendectomy and hernia repair who presented to ED complaining of right-sided abdominal pain, increased shortness of breath since he fell on 12/07/2023.  He attended ER on 12/07/2023 and diagnosed with a rib fracture.  Since then his daughter and caregiver advised me that he has some shortness of breath and abdominal pain and not able to take care of him at home.  He has a 24/7 coverage however it is increasingly difficult for him to get around at home due to abdominal pain and shortness of breath.  He and his family denied any new injuries or falls.  He is on aspirin  but not on any other anticoagulations.  He has been using walker to ambulate.  He states that he lives in a assisted living facility.  There is no fever, chills, cough, palpitations, nausea, vomiting, diarrhea, urinary symptoms.     Assessment & Plan:   Principal Problem:   Multiple rib fractures Active Problems:   Stroke [facility])   Hyperlipidemia   Fall   Stage 3a chronic kidney disease (HCC)   Type 2 diabetes mellitus with hyperlipidemia (HCC)   Essential hypertension   NASH (nonalcoholic steatohepatitis)   Hypothyroidism   Pancreatic mass   Closed rib fracture   Pressure injury of skin   Parkinson's disease (HCC)  # Right 8-12 rib fractures No pneumonia, hemothorax, pneumothorax. CTA no PE. Pain is controlled. Has significant ecchymoses right flank extending down right posterior leg. Ct of pelvis was neg for fracture. Reports good pain control today. CBC stable, do not think significant hematoma - continue bowel regimen - d/c standing tramadol , continue prn oxy - PT/OT advising snf, toc consulted - IS  # L1 transverse process fracture # Chronic vertebral compression  fractures No significant pain there - PT/OT as above  # Hyponatremia Mild initially and is and chronic but today worsened to 122. Home diuretics? Labs suggestive of hypovolemia so diuretics held and challenged with 1 liter NS but sodium has decreased. Tsh elevated but on synthroid  and t4 actually mildly elevated so do not think significant hypothyroidism playing a role. Cosyntropin  testing ordered for this morning but nursing did not administer correctly so test canceled. At this point think hyponatremia likely 2/2 underlying liver disease - cosyntropin  test tomorrow - albumin  - fluid restriction - resume home lasix jordan - nephrology consulted, spoke w/ Dr. 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 Prior imaging not available for comparison. LFTs normal. Discussed with Dr. Therisa of GI, he advises outpatient GI f/u (will need referral or to establish with GI through the [facility]). Outpatient provider will need to attempt to obtain prior imaging for comparison. If sufficient concern for malignancy would likely need EUS and biopsy. Admitter has ordered ca 19-9 and AFP.  - outpatient GI f/u - monitor LFTs here  # T2DM Euglycemic - SSI for now - home jardiance, glipizide, januvia, metformin on hold  # Parkinson's disease Doesn't appear to be on any specific agents  # NASH cirrhosis # Thrombocytopenia With evidence splenomegaly and abdominal varices on CT imaging. Compensated. With thrombocytopenia. INR wnl, LFTs wnl - home spiro, lasix  resuming today  # CKD 3a Stable - monitor  # History CVA - home asa, not  on statin at home  # HTN Bp controlled - hold home losartan  to avoid hypotension given hyponatremia - spiro/lasix  on hold as above  # Hypothyroid TFTs are odd, with elevated tsh and free t4. Tsh has been normal in past on patient's synthroid  so tsh-producing adenoma sounds less likely - home synthroid  - f/u endo outpt   DVT prophylaxis:  lovenox  Code Status: full Family Communication: daughter [name] ([place]) updated at bedside 9/13, no answer when called yesterday or today. Daughter kristin updated telephonically 9/16  Level of care: Med-Surg Status is: inpt    Consultants:  nephrology  Procedures: none  Antimicrobials:  none    Subjective: No pain. No cough or dyspnea.   Objective: Vitals:   12/21/23 1620 12/21/23 2042 12/22/23 0404 12/22/23 0800  BP: (!) 108/59 108/63 122/70 132/64  Pulse: 70 76 68 66  Resp: 16 18 17 16   Temp: 97.8 F (36.6 C) 98.1 F (36.7 C) 98.6 F (37 C) 97.8 F (36.6 C)  TempSrc: Oral  Oral   SpO2: 98% 97% 97% 96%  Weight:      Height:        Intake/Output Summary (Last 24 hours) at 12/22/2023 1439 Last data filed at 12/22/2023 1201 Gross per 24 hour  Intake 660 ml  Output 1900 ml  Net -1240 ml   Filed Weights   12/17/23 0425  Weight: 79.2 kg    Examination:  General exam: Appears calm and comfortable  Respiratory system: Clear to auscultation save for rales at bases Cardiovascular system: S1 & S2 heard, RRR.   Gastrointestinal system: Abdomen is nondistended, soft and nontender.   Central nervous system: Alert and oriented. Non-focal exam Extremities: warm, trace LE edema Skin: bruising right flank and right thigh Psychiatry: Judgement and insight appear normal. Mood & affect appropriate.     Data Reviewed: I have personally reviewed following labs and imaging studies  CBC: Recent Labs  Lab 12/17/23 0431 12/18/23 0556 12/19/23 0511 12/22/23 0559  WBC 3.5* 3.0* 4.8 4.0  HGB 13.1 12.4* 11.4* 13.0  HCT 39.1 35.4* 32.1* 36.7*  MCV 92.9 90.8 89.9 88.9  PLT 90* 82* 84* 113*   Basic Metabolic Panel: Recent Labs  Lab 12/18/23 0556 12/19/23 0511 12/20/23 0539 12/21/23 0314 12/22/23 0559  NA 130* 126* 125* 122* 121*  K 4.2 4.3 4.4 4.4 4.2  CL 100 95* 92* 91* 90*  CO2 24 24 24 22 23   GLUCOSE 113* 142* 148* 135* 142*  BUN 33* 39* 37* 32* 35*   CREATININE 1.52* 1.76* 1.37* 1.19 1.36*  CALCIUM  8.8* 8.5* 8.7* 8.5* 8.7*   GFR: Estimated Creatinine Clearance: 45.5 mL/min (A) (by C-G formula based on SCr of 1.36 mg/dL (H)). Liver Function Tests: Recent Labs  Lab 12/17/23 0431 12/18/23 0556  AST 29 24  ALT 30 27  ALKPHOS 120 90  BILITOT 1.7* 2.1*  PROT 7.4 6.3*  ALBUMIN  3.6 3.2*   Recent Labs  Lab 12/17/23 0431  LIPASE 47   No results for input(s): AMMONIA in the last 168 hours. Coagulation Profile: Recent Labs  Lab 12/17/23 0450 12/18/23 0556  INR 1.0 1.1   Cardiac Enzymes: No results for input(s): CKTOTAL, CKMB, CKMBINDEX, TROPONINI in the last 168 hours. BNP (last 3 results) No results for input(s): PROBNP in the last 8760 hours. HbA1C: No results for input(s): HGBA1C in the last 72 hours.  CBG: Recent Labs  Lab 12/21/23 1154 12/21/23 1608 12/21/23 2044 12/22/23 0800 12/22/23 1141  GLUCAP 216* 163* 149* 130*  235*   Lipid Profile: No results for input(s): CHOL, HDL, LDLCALC, TRIG, CHOLHDL, LDLDIRECT in the last 72 hours. Thyroid  Function Tests: No results for input(s): TSH, T4TOTAL, FREET4, T3FREE, THYROIDAB in the last 72 hours.  Anemia Panel: No results for input(s): VITAMINB12, FOLATE, FERRITIN, TIBC, IRON, RETICCTPCT in the last 72 hours. Urine analysis:    Component Value Date/Time   COLORURINE STRAW (A) 12/17/2023 0431   APPEARANCEUR CLEAR (A) 12/17/2023 0431   LABSPEC 1.005 12/17/2023 0431   PHURINE 7.0 12/17/2023 0431   GLUCOSEU >=500 (A) 12/17/2023 0431   HGBUR NEGATIVE 12/17/2023 0431   BILIRUBINUR NEGATIVE 12/17/2023 0431   KETONESUR NEGATIVE 12/17/2023 0431   PROTEINUR NEGATIVE 12/17/2023 0431   NITRITE NEGATIVE 12/17/2023 0431   LEUKOCYTESUR NEGATIVE 12/17/2023 0431   Sepsis Labs: @LABRCNTIP (procalcitonin:4,lacticidven:4)  ) Recent Results (from the past 240 hours)  Resp panel by RT-PCR (RSV, Flu A&B, Covid) Anterior Nasal  Swab     Status: None   Collection Time: 12/17/23  4:58 AM   Specimen: Anterior Nasal Swab  Result Value Ref Range Status   SARS Coronavirus 2 by RT PCR NEGATIVE NEGATIVE Final    Comment: (NOTE) SARS-CoV-2 target nucleic acids are NOT DETECTED.  The SARS-CoV-2 RNA is generally detectable in upper respiratory specimens during the acute phase of infection. The lowest concentration of SARS-CoV-2 viral copies this assay can detect is 138 copies/mL. A negative result does not preclude SARS-Cov-2 infection and should not be used as the sole basis for treatment or other patient management decisions. A negative result may occur with  improper specimen collection/handling, submission of specimen other than nasopharyngeal swab, presence of viral mutation(s) within the areas targeted by this assay, and inadequate number of viral copies(<138 copies/mL). A negative result must be combined with clinical observations, patient history, and epidemiological information. The expected result is Negative.  Fact Sheet for Patients:  BloggerCourse.com  Fact Sheet for Healthcare Providers:  SeriousBroker.it  This test is no t yet approved or cleared by the [place]  FDA and  has been authorized for detection and/or diagnosis of SARS-CoV-2 by FDA under an Emergency Use Authorization (EUA). This EUA will remain  in effect (meaning this test can be used) for the duration of the COVID-19 declaration under Section 564(b)(1) of the Act, 21 U.S.C.section 360bbb-3(b)(1), unless the authorization is terminated  or revoked sooner.       Influenza A by PCR NEGATIVE NEGATIVE Final   Influenza B by PCR NEGATIVE NEGATIVE Final    Comment: (NOTE) The Xpert Xpress SARS-CoV-2/FLU/RSV plus assay is intended as an aid in the diagnosis of influenza from Nasopharyngeal swab specimens and should not be used as a sole basis for treatment. Nasal washings and aspirates  are unacceptable for Xpert Xpress SARS-CoV-2/FLU/RSV testing.  Fact Sheet for Patients: BloggerCourse.com  Fact Sheet for Healthcare Providers: SeriousBroker.it  This test is not yet approved or cleared by the [place]  FDA and has been authorized for detection and/or diagnosis of SARS-CoV-2 by FDA under an Emergency Use Authorization (EUA). This EUA will remain in effect (meaning this test can be used) for the duration of the COVID-19 declaration under Section 564(b)(1) of the Act, 21 U.S.C. section 360bbb-3(b)(1), unless the authorization is terminated or revoked.     Resp Syncytial Virus by PCR NEGATIVE NEGATIVE Final    Comment: (NOTE) Fact Sheet for Patients: BloggerCourse.com  Fact Sheet for Healthcare Providers: SeriousBroker.it  This test is not yet approved or cleared by the [place]  FDA and has  been authorized for detection and/or diagnosis of SARS-CoV-2 by FDA under an Emergency Use Authorization (EUA). This EUA will remain in effect (meaning this test can be used) for the duration of the COVID-19 declaration under Section 564(b)(1) of the Act, 21 U.S.C. section 360bbb-3(b)(1), unless the authorization is terminated or revoked.  Performed at [facility], [address], [place], [place] [address]          Radiology Studies: No results found.       Scheduled Meds:  acetaminophen   500 mg Oral BID   aspirin  EC  81 mg Oral Daily   [START ON 12/23/2023] cosyntropin   0.25 mg Intravenous Once   feeding supplement (GLUCERNA SHAKE)  237 mL Oral BID BM   ferrous sulfate   325 mg Oral Q breakfast   furosemide   20 mg Oral Daily   insulin  aspart  0-5 Units Subcutaneous QHS   insulin  aspart  0-6 Units Subcutaneous TID WC   latanoprost   1 drop Both Eyes QHS   levothyroxine   150 mcg Oral Q0600   pantoprazole   40 mg Oral BID   polyethylene glycol   34 g Oral Daily   senna  1 tablet Oral QHS   sodium chloride   1 g Oral TID WC   spironolactone   25 mg Oral Daily   traMADol   50 mg Oral BID   Continuous Infusions:     LOS: 2 days     Devaughn KATHEE Ban, MD [facility]   If 7PM-7AM, please contact night-coverage [email or web address] Password [facility] 12/22/2023, 2:39 PM

## 2023-12-22 NOTE — Progress Notes (Signed)
 Mobility Specialist - Progress Note    12/22/23 1525  Mobility  Activity Stood at bedside;Pivoted/transferred to/from [facility];Pivoted/transferred from chair to bed  Level of Assistance Minimal assist, patient does 75% or more  Assistive Device Front wheel walker  Distance Ambulated (ft) 3 ft  Range of Motion/Exercises Active  Activity Response Tolerated well  Mobility Referral Yes  Mobility visit 1 Mobility  Mobility Specialist Start Time (ACUTE ONLY) 1519  Mobility Specialist Stop Time (ACUTE ONLY) 1537  Mobility Specialist Time Calculation (min) (ACUTE ONLY) 18 min   Pt resting in recliner on RA upon entry. Pt STS and transferred to the [facility] and then back to bed MinA with RW. Pt left in bed with needs in reach and bed alarm activated.   Guido Rumble Mobility Specialist 12/22/23, 3:56 PM

## 2023-12-22 NOTE — TOC Progression Note (Signed)
 Transition of Care [facility]) - Progression Note    Patient Details  Name: Brysten Reister MRN: [id number] Date of Birth: March 19, 1946  Transition of Care [facility]) CM/SW Contact  Dalia GORMAN Fuse, RN Phone Number: 12/22/2023, 4:32 PM  Clinical Narrative:     Shara received for WOM  Approved PlanAuthID:[id number] Dates: 9/16-9/18/2025 Next Review Date:12/24/2023  TOC placed call to [facility] to make her aware. Patient is not medically ready for discharge at this time as NA is still trending low.                     Expected Discharge Plan and Services                                               Social Drivers of Health (SDOH) Interventions SDOH Screenings   Food Insecurity: No Food Insecurity (12/17/2023)  Housing: Low Risk  (12/17/2023)  Transportation Needs: No Transportation Needs (12/17/2023)  Utilities: Not At Risk (12/17/2023)  Financial Resource Strain: Low Risk  (12/15/2023)   Received from [facility]  Social Connections: Moderately Isolated (12/17/2023)  Tobacco Use: Medium Risk (12/17/2023)    Readmission Risk Interventions     No data to display

## 2023-12-23 DIAGNOSIS — E782 Mixed hyperlipidemia: Secondary | ICD-10-CM

## 2023-12-23 DIAGNOSIS — N1831 Chronic kidney disease, stage 3a: Secondary | ICD-10-CM

## 2023-12-23 DIAGNOSIS — I639 Cerebral infarction, unspecified: Secondary | ICD-10-CM | POA: Diagnosis not present

## 2023-12-23 DIAGNOSIS — I10 Essential (primary) hypertension: Secondary | ICD-10-CM

## 2023-12-23 DIAGNOSIS — S2241XA Multiple fractures of ribs, right side, initial encounter for closed fracture: Secondary | ICD-10-CM | POA: Diagnosis not present

## 2023-12-23 DIAGNOSIS — E1169 Type 2 diabetes mellitus with other specified complication: Secondary | ICD-10-CM

## 2023-12-23 DIAGNOSIS — G20A1 Parkinson's disease without dyskinesia, without mention of fluctuations: Secondary | ICD-10-CM

## 2023-12-23 DIAGNOSIS — E039 Hypothyroidism, unspecified: Secondary | ICD-10-CM

## 2023-12-23 DIAGNOSIS — E785 Hyperlipidemia, unspecified: Secondary | ICD-10-CM

## 2023-12-23 DIAGNOSIS — E871 Hypo-osmolality and hyponatremia: Secondary | ICD-10-CM

## 2023-12-23 LAB — HEPATIC FUNCTION PANEL
ALT: 29 U/L (ref 0–44)
AST: 36 U/L (ref 15–41)
Albumin: 3 g/dL — ABNORMAL LOW (ref 3.5–5.0)
Alkaline Phosphatase: 84 U/L (ref 38–126)
Bilirubin, Direct: 0.4 mg/dL — ABNORMAL HIGH (ref 0.0–0.2)
Indirect Bilirubin: 1.3 mg/dL — ABNORMAL HIGH (ref 0.3–0.9)
Total Bilirubin: 1.7 mg/dL — ABNORMAL HIGH (ref 0.0–1.2)
Total Protein: 6 g/dL — ABNORMAL LOW (ref 6.5–8.1)

## 2023-12-23 LAB — GLUCOSE, CAPILLARY
Glucose-Capillary: 167 mg/dL — ABNORMAL HIGH (ref 70–99)
Glucose-Capillary: 251 mg/dL — ABNORMAL HIGH (ref 70–99)
Glucose-Capillary: 242 mg/dL — ABNORMAL HIGH (ref 70–99)
Glucose-Capillary: 173 mg/dL — ABNORMAL HIGH (ref 70–99)

## 2023-12-23 LAB — ACTH STIMULATION, 3 TIME POINTS
Cortisol, 30 Min: 24.6 ug/dL
Cortisol, 60 Min: 34.7 ug/dL
Cortisol, Base: 10.1 ug/dL

## 2023-12-23 LAB — BASIC METABOLIC PANEL WITH GFR
Anion gap: 12 (ref 5–15)
BUN: 32 mg/dL — ABNORMAL HIGH (ref 8–23)
CO2: 22 mmol/L (ref 22–32)
Calcium: 8.4 mg/dL — ABNORMAL LOW (ref 8.9–10.3)
Chloride: 87 mmol/L — ABNORMAL LOW (ref 98–111)
Creatinine, Ser: 1.3 mg/dL — ABNORMAL HIGH (ref 0.61–1.24)
GFR, Estimated: 56 mL/min — ABNORMAL LOW (ref >60)
Glucose, Bld: 127 mg/dL — ABNORMAL HIGH (ref 70–99)
Potassium: 4.2 mmol/L (ref 3.5–5.1)
Sodium: 121 mmol/L — ABNORMAL LOW (ref 135–145)

## 2023-12-23 MED ORDER — ORAL CARE MOUTH RINSE: 15.0000 mL | OROMUCOSAL | Status: DC | PRN

## 2023-12-23 MED ORDER — LOSARTAN POTASSIUM 25 MG PO TABS
25.0000 mg | ORAL_TABLET | Freq: Every day | ORAL | Status: DC
  Administered 2023-12-23 – 2023-12-24 (×2): 25 mg via ORAL
  Filled 2023-12-23 (×2): qty 1

## 2023-12-23 NOTE — Progress Notes (Signed)
 [facility]  ROUNDING NOTE   Subjective:   Patient seen sitting up in bed Private aide and nurse at bedside during med pass Patient alert and oriented Remains on room air Foley catheter in place, adequate urine in drainage bag  Sodium 121  Objective:  Vital signs in last 24 hours:  Temp:  [97.5 F (36.4 C)-98.7 F (37.1 C)] 97.5 F (36.4 C) (09/17 0737) Pulse Rate:  [65-75] 68 (09/17 0737) Resp:  [17-19] 18 (09/17 0737) BP: (121-156)/(63-73) 156/73 (09/17 0737) SpO2:  [96 %-99 %] 97 % (09/17 0737)  Weight change:  Filed Weights   12/17/23 0425  Weight: 79.2 kg    Intake/Output: I/O last 3 completed shifts: In: 1080 [P.O.:1080] Out: 1901 [Urine:1901]   Intake/Output this shift:  Total I/O In: 240 [P.O.:240] Out: -   Physical Exam: General: NAD  Head: Normocephalic, atraumatic. Moist oral mucosal membranes  Eyes: Anicteric  Neck: Supple  Lungs:  Clear to auscultation  Heart: Regular rate and rhythm  Abdomen:  Soft, nontender  Extremities:  No peripheral edema.  Neurologic: Awake, alert, conversant  Skin: Warm,dry, no rash       Basic Metabolic Panel: Recent Labs  Lab 12/19/23 0511 12/20/23 0539 12/21/23 0314 12/22/23 0559 12/23/23 0554  NA 126* 125* 122* 121* 121*  K 4.3 4.4 4.4 4.2 4.2  CL 95* 92* 91* 90* 87*  CO2 24 24 22 23 22   GLUCOSE 142* 148* 135* 142* 127*  BUN 39* 37* 32* 35* 32*  CREATININE 1.76* 1.37* 1.19 1.36* 1.30*  CALCIUM  8.5* 8.7* 8.5* 8.7* 8.4*    Liver Function Tests: Recent Labs  Lab 12/17/23 0431 12/18/23 0556 12/23/23 0554  AST 29 24 36  ALT 30 27 29   ALKPHOS 120 90 84  BILITOT 1.7* 2.1* 1.7*  PROT 7.4 6.3* 6.0*  ALBUMIN  3.6 3.2* 3.0*   Recent Labs  Lab 12/17/23 0431  LIPASE 47   No results for input(s): AMMONIA in the last 168 hours.  CBC: Recent Labs  Lab 12/17/23 0431 12/18/23 0556 12/19/23 0511 12/22/23 0559  WBC 3.5* 3.0* 4.8 4.0  HGB 13.1 12.4* 11.4* 13.0  HCT 39.1 35.4* 32.1*  36.7*  MCV 92.9 90.8 89.9 88.9  PLT 90* 82* 84* 113*    Cardiac Enzymes: No results for input(s): CKTOTAL, CKMB, CKMBINDEX, TROPONINI in the last 168 hours.  BNP: Invalid input(s): POCBNP  CBG: Recent Labs  Lab 12/22/23 0800 12/22/23 1141 12/22/23 1653 12/22/23 2039 12/23/23 0738  GLUCAP 130* 235* 190* 183* 167*    Microbiology: Results for orders placed or performed during the hospital encounter of 12/17/23  Resp panel by RT-PCR (RSV, Flu A&B, Covid) Anterior Nasal Swab     Status: None   Collection Time: 12/17/23  4:58 AM   Specimen: Anterior Nasal Swab  Result Value Ref Range Status   SARS Coronavirus 2 by RT PCR NEGATIVE NEGATIVE Final    Comment: (NOTE) SARS-CoV-2 target nucleic acids are NOT DETECTED.  The SARS-CoV-2 RNA is generally detectable in upper respiratory specimens during the acute phase of infection. The lowest concentration of SARS-CoV-2 viral copies this assay can detect is 138 copies/mL. A negative result does not preclude SARS-Cov-2 infection and should not be used as the sole basis for treatment or other patient management decisions. A negative result may occur with  improper specimen collection/handling, submission of specimen other than nasopharyngeal swab, presence of viral mutation(s) within the areas targeted by this assay, and inadequate number of viral copies(<138 copies/mL). A  negative result must be combined with clinical observations, patient history, and epidemiological information. The expected result is Negative.  Fact Sheet for Patients:  BloggerCourse.com  Fact Sheet for Healthcare Providers:  SeriousBroker.it  This test is no t yet approved or cleared by the [place]  FDA and  has been authorized for detection and/or diagnosis of SARS-CoV-2 by FDA under an Emergency Use Authorization (EUA). This EUA will remain  in effect (meaning this test can be used) for the  duration of the COVID-19 declaration under Section 564(b)(1) of the Act, 21 U.S.C.section 360bbb-3(b)(1), unless the authorization is terminated  or revoked sooner.       Influenza A by PCR NEGATIVE NEGATIVE Final   Influenza B by PCR NEGATIVE NEGATIVE Final    Comment: (NOTE) The Xpert Xpress SARS-CoV-2/FLU/RSV plus assay is intended as an aid in the diagnosis of influenza from Nasopharyngeal swab specimens and should not be used as a sole basis for treatment. Nasal washings and aspirates are unacceptable for Xpert Xpress SARS-CoV-2/FLU/RSV testing.  Fact Sheet for Patients: BloggerCourse.com  Fact Sheet for Healthcare Providers: SeriousBroker.it  This test is not yet approved or cleared by the [place]  FDA and has been authorized for detection and/or diagnosis of SARS-CoV-2 by FDA under an Emergency Use Authorization (EUA). This EUA will remain in effect (meaning this test can be used) for the duration of the COVID-19 declaration under Section 564(b)(1) of the Act, 21 U.S.C. section 360bbb-3(b)(1), unless the authorization is terminated or revoked.     Resp Syncytial Virus by PCR NEGATIVE NEGATIVE Final    Comment: (NOTE) Fact Sheet for Patients: BloggerCourse.com  Fact Sheet for Healthcare Providers: SeriousBroker.it  This test is not yet approved or cleared by the [place]  FDA and has been authorized for detection and/or diagnosis of SARS-CoV-2 by FDA under an Emergency Use Authorization (EUA). This EUA will remain in effect (meaning this test can be used) for the duration of the COVID-19 declaration under Section 564(b)(1) of the Act, 21 U.S.C. section 360bbb-3(b)(1), unless the authorization is terminated or revoked.  Performed at [facility], [address] [place], [place] [address]     Coagulation Studies: No results for input(s):  LABPROT, INR in the last 72 hours.  Urinalysis: No results for input(s): COLORURINE, LABSPEC, PHURINE, GLUCOSEU, HGBUR, BILIRUBINUR, KETONESUR, PROTEINUR, UROBILINOGEN, NITRITE, LEUKOCYTESUR in the last 72 hours.  Invalid input(s): APPERANCEUR    Imaging: No results found.   Medications:     acetaminophen   500 mg Oral BID   aspirin  EC  81 mg Oral Daily   feeding supplement (GLUCERNA SHAKE)  237 mL Oral BID BM   ferrous sulfate   325 mg Oral Q breakfast   furosemide   20 mg Oral Daily   insulin  aspart  0-5 Units Subcutaneous QHS   insulin  aspart  0-6 Units Subcutaneous TID WC   latanoprost   1 drop Both Eyes QHS   levothyroxine   150 mcg Oral Q0600   losartan   25 mg Oral Daily   pantoprazole   40 mg Oral BID   polyethylene glycol  34 g Oral Daily   senna  1 tablet Oral QHS   spironolactone   25 mg Oral Daily   traMADol   50 mg Oral BID   acetaminophen  **OR** acetaminophen , ondansetron  **OR** ondansetron  (ZOFRAN ) IV, oxyCODONE   Assessment/ Plan:  Mr. Charles Wilkinson is a 78 y.o.  male with medical problems of  Hypertension, diabetes, hyperlipidemia, hypothyroidism, NASH, CKD, history of falls, pancreatic mass, portal hypertension, cirrhosis, right rib fracture,  right L1 transverse process fracture, T12 and L1 compression fractures, infrarenal abdominal aneurysm, aortic atherosclerosis was admitted on 12/17/2023 for Shortness of breath [R06.02] Fall [W19.XXXA] Right sided abdominal pain [R10.9] Closed fracture of multiple ribs of right side with routine healing, subsequent encounter [S22.41XD]    Hyponatremia Last known normal sodium was in November 2024.  Sodium of 135 on October 08, 2023 which may be his baseline. Admission sodium was 130 which has progressively worsened. This is likely multifactorial.  Patient received IV contrast at admission which may have caused AKI and fluid retention leading to worsening of hyponatremia.  Other contributing factor is  likely hypothyroidism with TSH of 18.9  Sodium remains 121 today. We did consider trial dose of Tolvaptan, despite cirrhosis noted on CT scan, patient has normal liver enzymes. Will hold for now as patient continues to make adequate urine. Continue fluid restriction and monitoring.     Acute kidney injury Likely secondary to IV contrast. Creatinine peaked at 1.7 but is now improved. Baseline creatinine of 1.2-1.3 (underlying CKD 3A)  Renal function remains at baseline.   Lab Results  Component Value Date   CREATININE 1.30 (H) 12/23/2023   CREATININE 1.36 (H) 12/22/2023   CREATININE 1.19 12/21/2023    Intake/Output Summary (Last 24 hours) at 12/23/2023 1113 Last data filed at 12/23/2023 1100 Gross per 24 hour  Intake 660 ml  Output 1001 ml  Net -341 ml   3.  Diabetes type 2 with CKD Hemoglobin A1c 7.8% from July 2025. Management as per primary team.    LOS: 3 Liya Strollo 9/17/202511:13 AM

## 2023-12-23 NOTE — Progress Notes (Signed)
 PROGRESS NOTE    Charles Wilkinson  FMW:[id number] DOB: 02-02-1946 DOA: 12/17/2023 PCP: Epifanio Alm SQUIBB, MD    Brief Narrative:   Charles Wilkinson is a pleasant 78 y.o. male with medical history significant for HTN, DM, HLD, hypothyroidism, NASH, CKD provide appendectomy and hernia repair who presented to ED complaining of right-sided abdominal pain, increased shortness of breath since he fell on 12/07/2023.  He attended ER on 12/07/2023 and diagnosed with a rib fracture.  Since then his daughter and caregiver advised me that he has some shortness of breath and abdominal pain and not able to take care of him at home.  He has a 24/7 coverage however it is increasingly difficult for him to get around at home due to abdominal pain and shortness of breath.  He and his family denied any new injuries or falls.  He is on aspirin  but not on any other anticoagulations.  He has been using walker to ambulate.  He states that he lives in a assisted living facility.  There is no fever, chills, cough, palpitations, nausea, vomiting, diarrhea, urinary symptoms.   9/17: Hemodynamically stable, slowly worsening hyponatremia with sodium at 121 today, nephrology is on board, good UOP so they are hoping some improvement otherwise they will try tolvaptan, liver functions seems stable.  Restarting losartan  due to elevated blood pressure. ACTH  stim test with appropriate response. MRI brain was ordered at daughter's request as she was concerned that patient was having difficulty finding words and some facial droop.  Assessment & Plan:   Principal Problem:   Multiple rib fractures Active Problems:   Stroke [facility])   Hyperlipidemia   Fall   Stage 3a chronic kidney disease (HCC)   Type 2 diabetes mellitus with hyperlipidemia (HCC)   Essential hypertension   NASH (nonalcoholic steatohepatitis)   Hypothyroidism   Pancreatic mass   Closed rib fracture   Pressure injury of skin   Parkinson's disease (HCC)  # Right 8-12 rib  fractures No pneumonia, hemothorax, pneumothorax. CTA no PE. Pain is controlled. Has significant ecchymoses right flank extending down right posterior leg. Ct of pelvis was neg for fracture. Reports good pain control today. CBC stable, do not think significant hematoma - continue bowel regimen - d/c standing tramadol , continue prn oxy - PT/OT advising snf, toc consulted - IS  # L1 transverse process fracture # Chronic vertebral compression fractures No significant pain there - PT/OT as above  # Hyponatremia Mild initially and is and chronic but today worsened to 121. Home diuretics? Labs suggestive of hypovolemia so diuretics held and challenged with 1 liter NS but sodium has decreased. Tsh elevated but on synthroid  and t4 actually mildly elevated so do not think significant hypothyroidism playing a role. 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 Prior imaging not available for comparison. LFTs normal. Discussed with Dr. Therisa of GI, he advises outpatient GI f/u (will need referral or to establish with GI through the [facility]). Outpatient provider will need to attempt to obtain prior imaging for comparison. If sufficient concern for malignancy would likely need EUS and biopsy.  Mildly elevated AFP at 26.5 and normal CA 19-9 - outpatient GI f/u - monitor LFTs here  # T2DM Euglycemic - SSI for now - home jardiance,  glipizide, januvia, metformin on hold  # Parkinson's disease Doesn't appear to be on any specific agents  # NASH cirrhosis # Thrombocytopenia With evidence splenomegaly and abdominal varices on CT imaging. Compensated. With thrombocytopenia. INR wnl, LFTs wnl - home spiro, lasix  resumed on  9/16  # CKD 3a Stable - monitor  # History CVA - home asa, not on statin at home - MRI brain was ordered due to daughter's concern that he was having difficulty finding words which is new.  # HTN Blood pressure elevated. -Starting home losartan  - Continue Lasix  and spironolactone   # Hypothyroid TFTs are odd, with elevated tsh and free t4. Tsh has been normal in past on patient's synthroid  so tsh-producing adenoma sounds less likely - home synthroid  - f/u endo outpt  DVT prophylaxis: lovenox  Code Status: full Family Communication: Talked with daughter on phone.  Level of care: Med-Surg Status is: inpt  Consultants:  Nephrology  Procedures: none  Antimicrobials:  none   Subjective: Patient was seen and examined today, sitting comfortably in chair and eating lunch.  No pain.  A caregiver at bedside.   Objective: Vitals:   12/22/23 2039 12/23/23 0426 12/23/23 0737 12/23/23 1556  BP: 135/67 121/63 (!) 156/73 139/74  Pulse: 75 65 68 67  Resp: 17 19 18 18   Temp: 98.7 F (37.1 C) 98.4 F (36.9 C) (!) 97.5 F (36.4 C) 98.2 F (36.8 C)  TempSrc: Oral   Oral  SpO2: 96% 99% 97% 99%  Weight:      Height:        Intake/Output Summary (Last 24 hours) at 12/23/2023 1659 Last data filed at 12/23/2023 1300 Gross per 24 hour  Intake 720 ml  Output 1001 ml  Net -281 ml   Filed Weights   12/17/23 0425  Weight: 79.2 kg    Examination:  General.  Frail elderly man, in no acute distress. Pulmonary.  Lungs clear bilaterally, normal respiratory effort. CV.  Regular rate and rhythm, no JVD, rub or murmur. Abdomen.  Soft, nontender, nondistended, BS positive. CNS.  Alert  .  No focal neurologic deficit. Extremities.  No edema,  pulses intact and symmetrical. Psychiatry.  Judgment and insight appears normal.   Data Reviewed: I have personally reviewed following labs and imaging studies  CBC: Recent Labs  Lab 12/17/23 0431 12/18/23 0556 12/19/23 0511  12/22/23 0559  WBC 3.5* 3.0* 4.8 4.0  HGB 13.1 12.4* 11.4* 13.0  HCT 39.1 35.4* 32.1* 36.7*  MCV 92.9 90.8 89.9 88.9  PLT 90* 82* 84* 113*   Basic Metabolic Panel: Recent Labs  Lab 12/19/23 0511 12/20/23 0539 12/21/23 0314 12/22/23 0559 12/23/23 0554  NA 126* 125* 122* 121* 121*  K 4.3 4.4 4.4 4.2 4.2  CL 95* 92* 91* 90* 87*  CO2 24 24 22 23 22   GLUCOSE 142* 148* 135* 142* 127*  BUN 39* 37* 32* 35* 32*  CREATININE 1.76* 1.37* 1.19 1.36* 1.30*  CALCIUM  8.5* 8.7* 8.5* 8.7* 8.4*   GFR: Estimated Creatinine Clearance: 47.6 mL/min (A) (by C-G formula based on SCr of 1.3 mg/dL (H)). Liver Function Tests: Recent Labs  Lab 12/17/23 0431 12/18/23 0556 12/23/23 0554  AST 29 24 36  ALT 30 27 29   ALKPHOS 120 90 84  BILITOT 1.7* 2.1* 1.7*  PROT 7.4 6.3* 6.0*  ALBUMIN  3.6 3.2* 3.0*   Recent Labs  Lab 12/17/23 0431  LIPASE 47   No results for input(s): AMMONIA in the last 168 hours. Coagulation Profile: Recent Labs  Lab 12/17/23 0450 12/18/23 0556  INR 1.0 1.1   Cardiac Enzymes: No results for input(s): CKTOTAL, CKMB, CKMBINDEX, TROPONINI in the last 168 hours. BNP (last 3 results) No results for input(s): PROBNP in the last 8760 hours. HbA1C: No results for input(s): HGBA1C in the last 72 hours.  CBG: Recent Labs  Lab 12/22/23 1653 12/22/23 2039 12/23/23 0738 12/23/23 1138 12/23/23 1556  GLUCAP 190* 183* 167* 251* 242*   Lipid Profile: No results for input(s): CHOL, HDL, LDLCALC, TRIG, CHOLHDL, LDLDIRECT in the last 72 hours. Thyroid  Function Tests: No results for input(s): TSH, T4TOTAL, FREET4, T3FREE, THYROIDAB in the last 72 hours.  Anemia Panel: No results for input(s): VITAMINB12, FOLATE, FERRITIN, TIBC, IRON, RETICCTPCT in the last 72 hours. Urine analysis:    Component Value Date/Time   COLORURINE STRAW (A) 12/17/2023 0431   APPEARANCEUR CLEAR (A) 12/17/2023 0431   LABSPEC 1.005 12/17/2023 0431    PHURINE 7.0 12/17/2023 0431   GLUCOSEU >=500 (A) 12/17/2023 0431   HGBUR NEGATIVE 12/17/2023 0431   BILIRUBINUR NEGATIVE 12/17/2023 0431   KETONESUR NEGATIVE 12/17/2023 0431   PROTEINUR NEGATIVE 12/17/2023 0431   NITRITE NEGATIVE 12/17/2023 0431   LEUKOCYTESUR NEGATIVE 12/17/2023 0431   Sepsis Labs: @LABRCNTIP (procalcitonin:4,lacticidven:4)  ) Recent Results (from the past 240 hours)  Resp panel by RT-PCR (RSV, Flu A&B, Covid) Anterior Nasal Swab     Status: None   Collection Time: 12/17/23  4:58 AM   Specimen: Anterior Nasal Swab  Result Value Ref Range Status   SARS Coronavirus 2 by RT PCR NEGATIVE NEGATIVE Final    Comment: (NOTE) SARS-CoV-2 target nucleic acids are NOT DETECTED.  The SARS-CoV-2 RNA is generally detectable in upper respiratory specimens during the acute phase of infection. The lowest concentration of SARS-CoV-2 viral copies this assay can detect is 138 copies/mL. A negative result does not preclude SARS-Cov-2 infection and should not be used as the sole basis for treatment or other patient management decisions. A negative result may occur with  improper specimen collection/handling, submission of specimen other than nasopharyngeal swab, presence of viral mutation(s) within the areas targeted by this assay, and inadequate number of viral copies(<138 copies/mL). A negative result must be combined with clinical observations, patient history, and epidemiological information. The expected result is Negative.  Fact Sheet for Patients:  BloggerCourse.com  Fact Sheet for Healthcare Providers:  SeriousBroker.it  This test is no t yet approved or cleared by the [place]  FDA and  has been authorized for detection and/or diagnosis of SARS-CoV-2 by FDA under an Emergency Use Authorization (EUA). This EUA will remain  in effect (meaning this test can be used) for the duration of the COVID-19 declaration under  Section 564(b)(1) of the Act, 21 U.S.C.section 360bbb-3(b)(1), unless the authorization is terminated  or revoked sooner.       Influenza A by PCR NEGATIVE NEGATIVE Final   Influenza B by PCR NEGATIVE NEGATIVE Final    Comment: (NOTE) The Xpert Xpress SARS-CoV-2/FLU/RSV plus assay is intended as an aid in the diagnosis of influenza from Nasopharyngeal swab specimens and should not be used as a sole basis for treatment. Nasal washings and aspirates are unacceptable for Xpert Xpress SARS-CoV-2/FLU/RSV testing.  Fact Sheet for Patients: BloggerCourse.com  Fact Sheet for Healthcare Providers: SeriousBroker.it  This test is not yet approved or cleared by the [place]  FDA and has been authorized for detection and/or diagnosis of SARS-CoV-2 by FDA under an Emergency Use Authorization (EUA). This EUA will remain in effect (meaning this  test can be used) for the duration of the COVID-19 declaration under Section 564(b)(1) of the Act, 21 U.S.C. section 360bbb-3(b)(1), unless the authorization is terminated or revoked.     Resp Syncytial Virus by PCR NEGATIVE NEGATIVE Final    Comment: (NOTE) Fact Sheet for Patients: BloggerCourse.com  Fact Sheet for Healthcare Providers: SeriousBroker.it  This test is not yet approved or cleared by the [place]  FDA and has been authorized for detection and/or diagnosis of SARS-CoV-2 by FDA under an Emergency Use Authorization (EUA). This EUA will remain in effect (meaning this test can be used) for the duration of the COVID-19 declaration under Section 564(b)(1) of the Act, 21 U.S.C. section 360bbb-3(b)(1), unless the authorization is terminated or revoked.  Performed at [facility], [address] [place], [place] [address]      Radiology Studies: No results found.  Scheduled Meds:  acetaminophen   500 mg Oral BID    aspirin  EC  81 mg Oral Daily   feeding supplement (GLUCERNA SHAKE)  237 mL Oral BID BM   ferrous sulfate   325 mg Oral Q breakfast   furosemide   20 mg Oral Daily   insulin  aspart  0-5 Units Subcutaneous QHS   insulin  aspart  0-6 Units Subcutaneous TID WC   latanoprost   1 drop Both Eyes QHS   levothyroxine   150 mcg Oral Q0600   losartan   25 mg Oral Daily   pantoprazole   40 mg Oral BID   polyethylene glycol  34 g Oral Daily   senna  1 tablet Oral QHS   spironolactone   25 mg Oral Daily   traMADol   50 mg Oral BID   Continuous Infusions:   LOS: 3 days   This record has been created using Conservation officer, historic buildings. Errors have been sought and corrected,but may not always be located. Such creation errors do not reflect on the standard of care.   Amaryllis Dare, MD [facility]   If 7PM-7AM, please contact night-coverage [email or web address] Password [facility] 12/23/2023, 4:59 PM

## 2023-12-23 NOTE — Care Management Important Message (Signed)
 Important Message  Patient Details  Name: Charles Wilkinson MRN: [id number] Date of Birth: 1945/11/11   Important Message Given:  Yes - Medicare IM     Lavona Norsworthy W, CMA 12/23/2023, 11:47 AM

## 2023-12-23 NOTE — Plan of Care (Signed)
  Problem: Coping: Goal: Ability to adjust to condition or change in health will improve Outcome: Progressing   Problem: Fluid Volume: Goal: Ability to maintain a balanced intake and output will improve Outcome: Progressing   Problem: Health Behavior/Discharge Planning: Goal: Ability to manage health-related needs will improve Outcome: Progressing

## 2023-12-23 NOTE — Progress Notes (Signed)
 Physical Therapy Treatment Patient Details Name: Charles Wilkinson MRN: [id number] DOB: September 30, 1945 Today's Date: 12/23/2023   History of Present Illness Pt is a 78 year old male presented to ED complaining of right-sided abdominal pain, increased shortness of breath since he fell on 12/07/2023; now a new pancreatic mass and right-sided 4 rib fractures and L1 compression fracture    PMH significant for HTN, DM, HLD, hypothyroidism, NASH, CKD provide appendectomy and hernia repair    PT Comments  Patient received on [facility] with caregiver present. Patient has flat affect and forward head. He follows direction with increased time. Patient is able to stand with min A +2. Posterior leaning in standing. He is able to ambulate 20 feet in room with RW and min A, increased time and cues needed for increasing step length and foot clearance. Patient will continue to benefit from skilled PT to improve balance, strength and safety with mobility.       If plan is discharge home, recommend the following: A little help with bathing/dressing/bathroom;A little help with walking and/or transfers;Assist for transportation   Can travel by private vehicle     Yes  Equipment Recommendations  None recommended by PT    Recommendations for Other Services       Precautions / Restrictions Precautions Precautions: Fall Precaution/Restrictions Comments: R rib fxs Restrictions Weight Bearing Restrictions Per Provider Order: No     Mobility  Bed Mobility               General bed mobility comments: NT patient received on BSC, then up in chair    Transfers Overall transfer level: Needs assistance Equipment used: Rolling walker (2 wheels) Transfers: Sit to/from Stand Sit to Stand: Mod assist, +2 safety/equipment           General transfer comment: Cues for hand placement, posterior leaning upon initial standing.    Ambulation/Gait Ambulation/Gait assistance: Min assist, +2 physical assistance Gait Distance  (Feet): 20 Feet Assistive device: Rolling walker (2 wheels) Gait Pattern/deviations: Step-to pattern, Decreased step length - right, Decreased step length - left, Decreased stride length, Narrow base of support Gait velocity: decreased     General Gait Details: patient requires cues for sequencing and safety. Decreased step length B. Decreased foot clearance B.   Stairs             Wheelchair Mobility     Tilt Bed    Modified Rankin (Stroke Patients Only)       Balance Overall balance assessment: Needs assistance Sitting-balance support: Feet supported Sitting balance-Leahy Scale: Fair     Standing balance support: Bilateral upper extremity supported, During functional activity, Reliant on assistive device for balance Standing balance-Leahy Scale: Fair Standing balance comment: posterior lean in standing; maxA with multimodal cues to correct, pt stands for 5 mins for BM/pericare                            Communication Communication Communication: Impaired Factors Affecting Communication: Reduced clarity of speech;Difficulty expressing self  Cognition Arousal: Alert Behavior During Therapy: Flat affect   PT - Cognitive impairments: Safety/Judgement, Sequencing                       PT - Cognition Comments: decreased awareness of decreased ability to manage secretions Following commands: Impaired Following commands impaired: Follows one step commands with increased time    Cueing Cueing Techniques: Verbal cues, Tactile cues  Exercises  General Comments        Pertinent Vitals/Pain Pain Assessment Faces Pain Scale: Hurts a little bit Pain Location: bottom, has sore on sacrum Pain Descriptors / Indicators: Discomfort, Sore Pain Intervention(s): Monitored during session, Repositioned    Home Living                          Prior Function            PT Goals (current goals can now be found in the care plan  section) Acute Rehab PT Goals Patient Stated Goal: decreased pain, get better, go to rehab PT Goal Formulation: With patient/family Time For Goal Achievement: 12/31/23 Potential to Achieve Goals: Good Progress towards PT goals: Progressing toward goals    Frequency    Min 2X/week      PT Plan      Co-evaluation              AM-PAC PT 6 Clicks Mobility   Outcome Measure  Help needed turning from your back to your side while in a flat bed without using bedrails?: A Little Help needed moving from lying on your back to sitting on the side of a flat bed without using bedrails?: A Lot Help needed moving to and from a bed to a chair (including a wheelchair)?: A Little Help needed standing up from a chair using your arms (e.g., wheelchair or bedside chair)?: A Lot Help needed to walk in hospital room?: A Little Help needed climbing 3-5 steps with a railing? : A Lot 6 Click Score: 15    End of Session Equipment Utilized During Treatment: Gait belt Activity Tolerance: Patient tolerated treatment well Patient left: in chair;with call bell/phone within reach;with family/visitor present Nurse Communication: Mobility status PT Visit Diagnosis: Unsteadiness on feet (R26.81);Difficulty in walking, not elsewhere classified (R26.2);Other abnormalities of gait and mobility (R26.89);Muscle weakness (generalized) (M62.81);Pain;History of falling (Z91.81) Pain - part of body:  (bottom)     Time: 8858-8842 PT Time Calculation (min) (ACUTE ONLY): 16 min  Charges:    $Gait Training: 8-22 mins PT General Charges $$ ACUTE PT VISIT: 1 Visit                     Garnell Begeman, PT, GCS 12/23/23,1:09 PM

## 2023-12-24 ENCOUNTER — Inpatient Hospital Stay

## 2023-12-24 DIAGNOSIS — N183 Chronic kidney disease, stage 3 unspecified: Secondary | ICD-10-CM | POA: Diagnosis not present

## 2023-12-24 DIAGNOSIS — R0602 Shortness of breath: Secondary | ICD-10-CM

## 2023-12-24 DIAGNOSIS — Z7401 Bed confinement status: Secondary | ICD-10-CM | POA: Diagnosis not present

## 2023-12-24 DIAGNOSIS — S199XXA Unspecified injury of neck, initial encounter: Secondary | ICD-10-CM | POA: Diagnosis not present

## 2023-12-24 DIAGNOSIS — S22081A Stable burst fracture of T11-T12 vertebra, initial encounter for closed fracture: Secondary | ICD-10-CM | POA: Diagnosis not present

## 2023-12-24 DIAGNOSIS — I129 Hypertensive chronic kidney disease with stage 1 through stage 4 chronic kidney disease, or unspecified chronic kidney disease: Secondary | ICD-10-CM | POA: Diagnosis not present

## 2023-12-24 DIAGNOSIS — K7581 Nonalcoholic steatohepatitis (NASH): Secondary | ICD-10-CM

## 2023-12-24 DIAGNOSIS — N1831 Chronic kidney disease, stage 3a: Secondary | ICD-10-CM | POA: Diagnosis not present

## 2023-12-24 DIAGNOSIS — R29890 Loss of height: Secondary | ICD-10-CM | POA: Diagnosis not present

## 2023-12-24 DIAGNOSIS — S0990XA Unspecified injury of head, initial encounter: Secondary | ICD-10-CM | POA: Diagnosis not present

## 2023-12-24 DIAGNOSIS — R4182 Altered mental status, unspecified: Secondary | ICD-10-CM | POA: Diagnosis not present

## 2023-12-24 DIAGNOSIS — S2241XD Multiple fractures of ribs, right side, subsequent encounter for fracture with routine healing: Secondary | ICD-10-CM | POA: Diagnosis not present

## 2023-12-24 DIAGNOSIS — M8008XA Age-related osteoporosis with current pathological fracture, vertebra(e), initial encounter for fracture: Secondary | ICD-10-CM | POA: Diagnosis not present

## 2023-12-24 DIAGNOSIS — I6782 Cerebral ischemia: Secondary | ICD-10-CM | POA: Diagnosis not present

## 2023-12-24 DIAGNOSIS — K8689 Other specified diseases of pancreas: Secondary | ICD-10-CM | POA: Diagnosis not present

## 2023-12-24 DIAGNOSIS — I7143 Infrarenal abdominal aortic aneurysm, without rupture: Secondary | ICD-10-CM | POA: Diagnosis not present

## 2023-12-24 DIAGNOSIS — N179 Acute kidney failure, unspecified: Secondary | ICD-10-CM | POA: Diagnosis not present

## 2023-12-24 DIAGNOSIS — E1122 Type 2 diabetes mellitus with diabetic chronic kidney disease: Secondary | ICD-10-CM | POA: Diagnosis not present

## 2023-12-24 DIAGNOSIS — G20A1 Parkinson's disease without dyskinesia, without mention of fluctuations: Secondary | ICD-10-CM | POA: Diagnosis not present

## 2023-12-24 DIAGNOSIS — E039 Hypothyroidism, unspecified: Secondary | ICD-10-CM | POA: Diagnosis not present

## 2023-12-24 DIAGNOSIS — M546 Pain in thoracic spine: Secondary | ICD-10-CM | POA: Diagnosis not present

## 2023-12-24 DIAGNOSIS — S32018D Other fracture of first lumbar vertebra, subsequent encounter for fracture with routine healing: Secondary | ICD-10-CM | POA: Diagnosis not present

## 2023-12-24 DIAGNOSIS — W19XXXA Unspecified fall, initial encounter: Secondary | ICD-10-CM | POA: Diagnosis not present

## 2023-12-24 DIAGNOSIS — Z7982 Long term (current) use of aspirin: Secondary | ICD-10-CM | POA: Diagnosis not present

## 2023-12-24 DIAGNOSIS — E871 Hypo-osmolality and hyponatremia: Secondary | ICD-10-CM

## 2023-12-24 DIAGNOSIS — S2231XA Fracture of one rib, right side, initial encounter for closed fracture: Secondary | ICD-10-CM | POA: Diagnosis not present

## 2023-12-24 DIAGNOSIS — S32021A Stable burst fracture of second lumbar vertebra, initial encounter for closed fracture: Secondary | ICD-10-CM | POA: Diagnosis not present

## 2023-12-24 DIAGNOSIS — E782 Mixed hyperlipidemia: Secondary | ICD-10-CM | POA: Diagnosis not present

## 2023-12-24 DIAGNOSIS — D696 Thrombocytopenia, unspecified: Secondary | ICD-10-CM | POA: Diagnosis not present

## 2023-12-24 DIAGNOSIS — Z043 Encounter for examination and observation following other accident: Secondary | ICD-10-CM | POA: Diagnosis not present

## 2023-12-24 DIAGNOSIS — I10 Essential (primary) hypertension: Secondary | ICD-10-CM | POA: Diagnosis not present

## 2023-12-24 DIAGNOSIS — S32011A Stable burst fracture of first lumbar vertebra, initial encounter for closed fracture: Secondary | ICD-10-CM | POA: Diagnosis not present

## 2023-12-24 DIAGNOSIS — G9341 Metabolic encephalopathy: Secondary | ICD-10-CM | POA: Diagnosis not present

## 2023-12-24 DIAGNOSIS — R9082 White matter disease, unspecified: Secondary | ICD-10-CM | POA: Diagnosis not present

## 2023-12-24 DIAGNOSIS — F05 Delirium due to known physiological condition: Secondary | ICD-10-CM | POA: Diagnosis not present

## 2023-12-24 DIAGNOSIS — S2241XA Multiple fractures of ribs, right side, initial encounter for closed fracture: Secondary | ICD-10-CM | POA: Diagnosis not present

## 2023-12-24 DIAGNOSIS — I6381 Other cerebral infarction due to occlusion or stenosis of small artery: Secondary | ICD-10-CM | POA: Diagnosis not present

## 2023-12-24 LAB — RENAL FUNCTION PANEL
Albumin: 3.3 g/dL — ABNORMAL LOW (ref 3.5–5.0)
Anion gap: 12 (ref 5–15)
BUN: 35 mg/dL — ABNORMAL HIGH (ref 8–23)
CO2: 25 mmol/L (ref 22–32)
Calcium: 9 mg/dL (ref 8.9–10.3)
Chloride: 87 mmol/L — ABNORMAL LOW (ref 98–111)
Creatinine, Ser: 1.23 mg/dL (ref 0.61–1.24)
GFR, Estimated: 60 mL/min — ABNORMAL LOW (ref >60)
Glucose, Bld: 167 mg/dL — ABNORMAL HIGH (ref 70–99)
Phosphorus: 2.5 mg/dL (ref 2.5–4.6)
Potassium: 4.4 mmol/L (ref 3.5–5.1)
Sodium: 124 mmol/L — ABNORMAL LOW (ref 135–145)

## 2023-12-24 LAB — GLUCOSE, CAPILLARY
Glucose-Capillary: 154 mg/dL — ABNORMAL HIGH (ref 70–99)
Glucose-Capillary: 212 mg/dL — ABNORMAL HIGH (ref 70–99)

## 2023-12-24 LAB — MAGNESIUM: Magnesium: 1.8 mg/dL (ref 1.7–2.4)

## 2023-12-24 MED ORDER — TRAMADOL HCL 50 MG PO TABS: 50.0000 mg | ORAL_TABLET | Freq: Two times a day (BID) | ORAL | 0 refills | Status: AC | PRN

## 2023-12-24 MED ORDER — POLYETHYLENE GLYCOL 3350 17 G PO PACK: 34.0000 g | PACK | Freq: Every day | ORAL | Status: AC

## 2023-12-24 MED ORDER — OXYCODONE HCL 5 MG PO TABS: 2.5000 mg | ORAL_TABLET | Freq: Four times a day (QID) | ORAL | 0 refills | Status: AC | PRN

## 2023-12-24 MED ORDER — SENNA 8.6 MG PO TABS: 1.0000 | ORAL_TABLET | Freq: Every day | ORAL | Status: AC

## 2023-12-24 MED ORDER — GLUCERNA SHAKE PO LIQD: 237.0000 mL | Freq: Two times a day (BID) | ORAL | Status: AC

## 2023-12-24 MED ORDER — SODIUM CHLORIDE 1 G PO TABS: 1.0000 g | ORAL_TABLET | Freq: Two times a day (BID) | ORAL | Status: AC

## 2023-12-24 MED ORDER — MAGNESIUM SULFATE 2 GM/50ML IV SOLN
2.0000 g | Freq: Once | INTRAVENOUS | Status: AC
  Administered 2023-12-24: 2 g via INTRAVENOUS
  Filled 2023-12-24: qty 50

## 2023-12-24 NOTE — Discharge Summary (Signed)
 Physician Discharge Summary   Patient: Charles Wilkinson MRN: [id number] DOB: 11-05-1945  Admit date:     12/17/2023  Discharge date: 12/24/23  Discharge Physician: Amaryllis Dare   PCP: Epifanio Alm SQUIBB, MD   Recommendations at discharge:  Please obtain CBC and BMP in a week Please recheck sodium in 3 days Please follow-up with primary care provider and they can adjust salt tablets as needed Please limit fluid to 1500 mL in 24-hour  Discharge Diagnoses: Principal Problem:   Multiple rib fractures Active Problems:   Stroke [facility])   Hyperlipidemia   Fall   Stage 3a chronic kidney disease (HCC)   Type 2 diabetes mellitus with hyperlipidemia (HCC)   Essential hypertension   NASH (nonalcoholic steatohepatitis)   Hypothyroidism   Pancreatic mass   Closed rib fracture   Pressure injury of skin   Parkinson's disease (HCC)   Shortness of breath   Hyponatremia   Hospital Course: Charles Wilkinson is a pleasant 78 y.o. male with medical history significant for HTN, DM, HLD, hypothyroidism, NASH, CKD provide appendectomy and hernia repair who presented to ED complaining of right-sided abdominal pain, increased shortness of breath since he fell on 12/07/2023.  He attended ER on 12/07/2023 and diagnosed with a rib fracture.  Since then his daughter and caregiver advised me that he has some shortness of breath and abdominal pain and not able to take care of him at home.  He has a 24/7 coverage however it is increasingly difficult for him to get around at home due to abdominal pain and shortness of breath.  He and his family denied any new injuries or falls.  He is on aspirin  but not on any other anticoagulations.  He has been using walker to ambulate.  He states that he lives in a assisted living facility.  There is no fever, chills, cough, palpitations, nausea, vomiting, diarrhea, urinary symptoms.   Patient was found to have right 8-10 rib fractures, CTA was negative for PE, no pneumothorax or hemothorax.   CT of pelvis was also negative for any fractures.  Also found to have significant ecchymosis involving right flank extending down right posterior leg.  Which seems improving now.  CBC remained stable and no concern of any significant hematoma. Patient was taking oxycodone  and tramadol  quite frequently at home, he was given prescriptions to continue but should limit the use.  Tramadol  is made every 12 hour as needed, oxycodone  dose was decreased to 2.5 mg and should not use more than a couple of times a day.  Please try limiting opioid use to decrease the risk of falls.  Patient also has an history of chronic vertebral compression fractures, also found to have L1 transverse process fracture, does need pain control and conservative management along with PT and OT.  Our physical therapist recommend SNF for rehab, patient with history of Hollie cirrhosis and developed worsening hyponatremia before leaving for rehab which delayed his discharge.  Likely multifactorial as he also has chronic mild hyponatremia.  Initially thought mild hypovolemia but sodium continued to decrease with IV fluid so it was discontinued.  TSH mildly elevated , does not think to be causing hyponatremia.  Nephrology was consulted and they thought it might be multifactorial with underlying liver disease and some fluid buildup as he is homeless he can spironolactone  was initially held, home medications were restarted and sodium started improving slowly, it was 124 on the day of discharge.  Nephrology recommend fluid restriction to 1500 mL daily and started  on salt tablets which he will continue on discharge, he will need a monitoring of his sodium and adjustment of salt tablets by PCP.  He can follow-up with nephrology as outpatient.  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  Discussed with GI and they were recommending outpatient follow-up, mildly elevated AFP  at 26.5 and normal CA 19-9.  Family does not want to be aggressive and further evaluation, he can see a gastroenterologist through [place] if he decided to pursue further investigation.  Patient also found to have an history of Parkinson's disease but does not appear to be on any specific agents.  He need to have a follow-up with outpatient neurology for further assistance.  Patient seems to have well compensated NASH cirrhosis and chronic stable thrombocytopenia.  Found to have splenomegaly and abdominal varices on CT images.  INR and liver function within normal limit.  He will continue with his home Lasix  and spironolactone .  Also has an history of CKD stage IIIa and which seems stable.  Repeat MRI brain was done due to daughter's concern that he was having more difficulty with finding words, it was negative for any acute intracranial abnormality, did show progressed ischemia since 2023 and new but chronic infarct and encephalomalacia in right mesial and inferior temporal lobe.  Did shows chronic small vessel disease and moderate for age chronic white matter disease most likely small vessel related. Patient has stopped taking home statin and will continue with aspirin .  Patient was found to have elevated TSH and free T4, he will continue with home Synthroid  and follow-up with PCP as outpatient for further assistance.  Patient will continue on current medications with the addition of salt tablets and follow-up with his providers for further management.  Patient is high risk for readmission or mentality based on advanced age, poor underlying functional status and comorbidities.     Pain control - [place]  Controlled Substance Reporting System database was reviewed. and patient was instructed, not to drive, operate heavy machinery, perform activities at heights, swimming or participation in water activities or provide baby-sitting services while on Pain, Sleep and Anxiety Medications; until their  outpatient Physician has advised to do so again. Also recommended to not to take more than prescribed Pain, Sleep and Anxiety Medications.  Consultants: Nephrology Procedures performed: None Disposition: Skilled nursing facility Diet recommendation:  Discharge Diet Orders (From admission, onward)     Start     Ordered   12/24/23 0000  Diet - low sodium heart healthy        12/24/23 1218           Carb modified diet DISCHARGE MEDICATION: Allergies as of 12/24/2023   No Known Allergies      Medication List     STOP taking these medications    atorvastatin  40 MG tablet Commonly known as: LIPITOR   metFORMIN 500 MG tablet Commonly known as: GLUCOPHAGE   nadolol  20 MG tablet Commonly known as: CORGARD        TAKE these medications    acetaminophen  500 MG tablet Commonly known as: TYLENOL  Take 500 mg by mouth 2 (two) times daily.   albuterol  108 (90 Base) MCG/ACT inhaler Commonly known as: VENTOLIN  HFA Inhale 2 puffs into the lungs every 6 (six) hours as needed for wheezing or shortness of breath.   ascorbic acid  1000 MG tablet Commonly known as: VITAMIN C Take 1 tablet by mouth  daily.   aspirin  EC 81 MG tablet Take 1 tablet (81 mg total) by mouth daily. Swallow whole.   Cholecalciferol  50 MCG (2000 UT) Caps Take 2,000 Units by mouth daily.   colchicine 0.6 MG tablet Take 1 tablet at onset of gout flare. Take 1 additional tablet (0.6 mg) one hour later if symptoms persist. Maximum dose is 1.2 mg (2 tablets) in 24 hours. Then take one tablet (0.6 mg) daily until symptoms resolve.   empagliflozin 25 MG Tabs tablet Commonly known as: JARDIANCE Take 25 mg by mouth daily.   feeding supplement (GLUCERNA SHAKE) Liqd Take 237 mLs by mouth 2 (two) times daily between meals.   ferrous sulfate  325 (65 FE) MG tablet Take 1 tablet by mouth daily with breakfast.   furosemide  20 MG tablet Commonly known as: LASIX  Take 20 mg by mouth.  TAKE ONE TABLET BY MOUTH EVERY  OTHER DAY FOR BLOOD PRESSURE AND FLUID   glipiZIDE 5 MG tablet Commonly known as: GLUCOTROL Take 5 mg by mouth.  TAKE ONE TABLET BY MOUTH EVERY DAY FOR TYPE 2 DIABETES MELLITUS TAKE 30 MINUTES BEFORE MEAL(S)   GLUCOSAMINE 1500 COMPLEX PO Take by mouth.   Guardian Sensor 3 Misc 1 each by Other route.  Use 1 each every 14 (fourteen) days   latanoprost  0.005 % ophthalmic solution Commonly known as: XALATAN    levothyroxine  150 MCG tablet Commonly known as: SYNTHROID  TAKE 1 TABLET EVERY DAY 30 TO 60 MIN BEFORE BREAKFAST ON AN EMPTY STOMACH WITH FULL GLASS OF WATER   lidocaine  5 % Commonly known as: LIDODERM  Place 1 patch onto the skin daily. Remove & Discard patch within 12 hours or as directed by MD   losartan  25 MG tablet Commonly known as: COZAAR  TAKE 1 TABLET(25 MG) BY MOUTH DAILY   magnesium  oxide 400 (240 Mg) MG tablet Commonly known as: MAG-OX TAKE 1 TABLET BY MOUTH EVERY MORNING AND 1 TABLET AT BEDTIME   Multi-Vitamin tablet Take 1 tablet by mouth daily.   Omega-3 Fish Oil 1200 MG Caps Take 1 capsule by mouth daily.   oxyCODONE  5 MG immediate release tablet Commonly known as: Roxicodone  Take 0.5 tablets (2.5 mg total) by mouth every 6 (six) hours as needed for severe pain (pain score 7-10).   pantoprazole  40 MG tablet Commonly known as: PROTONIX  Take 40 mg by mouth 2 (two) times daily.   polyethylene glycol 17 g packet Commonly known as: MIRALAX  / GLYCOLAX  Take 34 g by mouth daily. Start taking on: December 25, 2023   senna 8.6 MG Tabs tablet Commonly known as: SENOKOT Take 1 tablet (8.6 mg total) by mouth at bedtime.   sitaGLIPtin 50 MG tablet Commonly known as: JANUVIA Take 50 mg by mouth daily.   sodium chloride  1 g tablet Take 1 tablet (1 g total) by mouth 2 (two) times daily with a meal.   spironolactone  25 MG tablet Commonly known as: ALDACTONE  Take 25 mg by mouth daily. for high blood pressure   traMADol  50 MG tablet Commonly known as:  ULTRAM  Take 1 tablet (50 mg total) by mouth every 12 (twelve) hours as needed for moderate pain (pain score 4-6) (pain). What changed:  when to take this reasons to take this   Vitamin E  268 MG (400 UNIT) Caps Take 1 tablet by mouth daily.   zinc  sulfate (50mg  elemental zinc ) 220 (50 Zn) MG capsule Take 1 capsule (220 mg total) by mouth daily.  Discharge Care Instructions  (From admission, onward)           Start     Ordered   12/24/23 0000  No dressing needed        12/24/23 1218            Contact information for follow-up providers     Epifanio Alm SQUIBB, MD Follow up.   Specialty: Infectious Diseases Why: hospital follow up Contact information: [address] [place] [address] [phone]         Dennise Capri, MD Follow up.   Specialty: Nephrology Contact information: [address] [place] [address] [phone]              Contact information for after-discharge care     Destination     [facility] .   Service: Skilled Nursing Contact information: [address] [place]  [address] [phone]                    Discharge Exam: Fredricka Weights   12/17/23 0425  Weight: 79.2 kg   General.  Frail elderly man, in no acute distress. Pulmonary.  Lungs clear bilaterally, normal respiratory effort. CV.  Regular rate and rhythm, no JVD, rub or murmur. Abdomen.  Soft, nontender, nondistended, BS positive. CNS.  Alert and oriented .  No focal neurologic deficit. Extremities.  No edema, no cyanosis, pulses intact and symmetrical.   Condition at discharge: stable  The results of significant diagnostics from this hospitalization (including imaging, microbiology, ancillary and laboratory) are listed below for reference.   Imaging Studies: MR BRAIN WO CONTRAST Result Date: 12/24/2023 CLINICAL DATA:  78 year old male with recent fall, multiple rib  fractures. Stroke follow-up. EXAM: MRI HEAD WITHOUT CONTRAST TECHNIQUE: Multiplanar, multiecho pulse sequences of the brain and surrounding structures were obtained without intravenous contrast. COMPARISON:  Head CT 03/31/2023.  Brain MRI 07/12/2021. FINDINGS: Brain: Overall cerebral volume not significantly changed since 2023. Mild artifact on both axial and coronal diffusion-weighted imaging, unclear etiology. No restricted diffusion or evidence of acute infarction. Chronic but increased small number of lacunar infarcts in the bilateral cerebellum since 2023 (new on the right series 12, image 3). Chronic encephalomalacia right inferior temporal and parahippocampal gyri (series 21, images 15 and 17) new from 2023, and there is right hippocampal involvement also (series 21, image 16). No regional mass effect and mild ex vacuo enlargement of the right temporal horn there (series 13, image 17). Chronic lacunar infarct left caudate and basal ganglia with expected evolution from the 2023 MRI, hemosiderin there on SWI. Patchy and confluent additional bilateral cerebral white matter T2 and FLAIR hyperintensity stable since that time. No other cortical encephalomalacia is identified. No midline shift, mass effect, evidence of mass lesion, ventriculomegaly, extra-axial collection or acute intracranial hemorrhage. Cervicomedullary junction and pituitary are within normal limits. Vascular: Major intracranial vascular flow voids are stable since 2023. Mild generalized intracranial artery tortuosity. Skull and upper cervical spine: Degenerative appearing ligamentous hypertrophy about the odontoid has increased since 2023. Normal background bone marrow signal. Sinuses/Orbits: Chronic orbital floor fractures. Otherwise orbits appear stable and negative. Chronic paranasal sinus mucosal thickening, right posterior maxillary alveolar recess retention cyst. Other: Grossly negative visible internal auditory structures. No acute  scalp or face soft tissue finding. IMPRESSION: 1. No acute intracranial abnormality. 2. Progressed ischemia since 2023 with new but chronic infarct and encephalomalacia in the right mesial and inferior temporal lobe. Chronic small vessel disease in the  left caudate and mildly progressed in the cerebellum. Moderate for age chronic white matter disease also most likely small vessel related. Electronically Signed   By: VEAR Hurst M.D.   On: 12/24/2023 07:31   CT ABDOMEN PELVIS W CONTRAST Result Date: 12/17/2023 CLINICAL DATA:  78 year old male with rib pain after a fall earlier this month, right 10th rib fracture identified on radiographs 10 days ago. Increasing pain and shortness of breath. EXAM: CT ABDOMEN AND PELVIS WITH CONTRAST TECHNIQUE: Multidetector CT imaging of the abdomen and pelvis was performed using the standard protocol following bolus administration of intravenous contrast. RADIATION DOSE REDUCTION: This exam was performed according to the departmental dose-optimization program which includes automated exposure control, adjustment of the mA and/or kV according to patient size and/or use of iterative reconstruction technique. CONTRAST:  75mL OMNIPAQUE  IOHEXOL  350 MG/ML SOLN COMPARISON:  CTA chest today reported separately. Prior chest radiographs in 2022. FINDINGS: Lower chest: CTA reported separately today. Stable atelectasis at the lung bases on these images. In addition to the comminuted rib fractures described on chest CTA today there is a nondisplaced fracture of posterior right 12th rib. See additional bone details below. Hepatobiliary: Cirrhotic liver with stigmata of portal venous hypertension including extensive upper abdominal varices. Extensive distal paraesophageal varices are enhancing on series 2, image 14. Nodular liver contour. No discrete liver lesion. Abnormal gallbladder with a large partially calcified 2.7 cm gallstone within the lumen. No pericholecystic inflammation. Trace  perihepatic ascites Pancreas: Abnormal. Atrophied pancreatic body and tail with ductal dilatation, and dominant 3.0 x 4.7 cm cystic mass of the pancreatic head and/or uncinate (series 2, image 36 and coronal image 46. No peripancreatic lymphadenopathy, but there are coarse calcifications situated between the pancreas and the porta hepatis (the largest is 13 mm series 2, image 29) which may be postinflammatory calcified nodes. Spleen: Splenomegaly. Perisplenic varices. No discrete splenic lesion. Adrenals/Urinary Tract: Negative. Mildly to moderately distended urinary bladder. Stomach/Bowel: Redundant large bowel with mild to moderate retained stool. No large bowel inflammation identified. Cecum on a lax mesentery located in the right upper quadrant. Decompressed terminal ileum. Appendix diminutive or absent. Nondilated small bowel. Retained food debris in the stomach. Decompressed duodenum. No pneumoperitoneum. Vascular/Lymphatic: Advanced Aortoiliac calcified atherosclerosis. Infrarenal abdominal aortic aneurysm is calcified and measures up to 3.8 cm maximum diameter on series 2, image 46. Recommend ultrasound follow-up in 3 years. Underlying generalized arterial tortuosity in the abdomen and pelvis. Major arterial structures and portal venous system appear to be patent. Superimposed extensive upper abdominal venous varices, advanced in the gastrohepatic ligament (series 2, image 20) and paraesophageal space. No lymphadenopathy identified. Reproductive: Negative; small fat containing left inguinal hernia. Other: No pelvis free fluid. Musculoskeletal: Lower thoracic spine compression fractures, severe at T12. That level, and similar moderate to severe L1 compression fracture, appear unchanged from 2022 lateral chest radiographs. Superimposed moderate levoconvex lumbar scoliosis and advanced disc and endplate degeneration with multilevel vacuum disc. Minor compression of the L3 and L4 inferior endplates, likely  chronic. However, superimposed minimally displaced L1 right transverse process fracture on series 2, image 31. Other lumbar posterior elements appear intact. Sacrum, SI joints, pelvis, and proximal femurs appear intact. IMPRESSION: 1. 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 Peripancreatic coarse soft tissue calcifications which might be postinflammatory nodes. Pancreatic Carcinoma not excluded, although this constellation might instead be sequelae of chronic pancreatitis. 2. Superimposed Cirrhosis. Stigmata of portal venous hypertension including  extensive upper abdominal Varices, Splenomegaly, trace perihepatic Ascites. 3. Acute nondisplaced fractures of the posterior right 12th rib and the right L1 transverse process, in addition to the other right lower rib fractures on CTA Chest today (reported separately). 4. Moderate to severe T12 and L1 compression fractures are most likely chronic, suspected on 2022 radiographs. 5. Aortic Atherosclerosis (ICD10-I70.0). Infrarenal Abdominal Aortic Aneurysm up to 3.8 cm. Recommend follow-up ultrasound every 3 years. (Ref.: J Vasc Surg. 2018; 67:2-77 and J Am Coll Radiol 2013;10(10):789-794.) Aortic aneurysm NOS (ICD10-I71.9). Electronically Signed   By: VEAR Hurst M.D.   On: 12/17/2023 07:11   CT Angio Chest PE W and/or Wo Contrast Result Date: 12/17/2023 CLINICAL DATA:  78 year old male with rib pain after a fall earlier this month, right 10th rib fracture identified on radiographs 10 days ago. Increasing pain and shortness of breath. EXAM: CT ANGIOGRAPHY CHEST WITH CONTRAST TECHNIQUE: Multidetector CT imaging of the chest was performed using the standard protocol during bolus administration of intravenous contrast. Multiplanar CT image reconstructions and MIPs were obtained to evaluate the vascular anatomy. RADIATION DOSE REDUCTION: This exam was performed according to the  departmental dose-optimization program which includes automated exposure control, adjustment of the mA and/or kV according to patient size and/or use of iterative reconstruction technique. CONTRAST:  75mL OMNIPAQUE  IOHEXOL  350 MG/ML SOLN COMPARISON:  Chest and rib radiographs 12/07/2023. CT Abdomen and Pelvis today reported separately. Two-view chest radiographs 04/02/2021. FINDINGS: Cardiovascular: Adequate contrast bolus timing in the pulmonary arterial tree. Respiratory motion. This is most pronounced in the lower lobes. No pulmonary artery filling defect is identified. Calcified aortic atherosclerosis. Calcified coronary artery atherosclerosis. Normal heart size. No pericardial effusion. Mediastinum/Nodes: Negative for mediastinal hematoma, mass, lymphadenopathy. Lungs/Pleura: Small volume retained secretions in the trachea. Major airways are patent. Negative for pneumothorax. No significant pleural effusion. Bilateral lower lobe and lung base atelectasis which is platelike and enhancing greater on the right. Upper Abdomen: CT Abdomen and Pelvis today is reported separately. Musculoskeletal: Incompletely visible T1 vertebra. Superior endplate compression of T2 is mild, moderate at T4, mild at T10, mild at T11. And T12 compression fracture is moderate to severe, near vertebra plana. Several of these levels are sclerotic. But all levels are fairly age indeterminate. The T12 compression fracture was probably visible in 2022. Lumbar spine is reported separately today. Comminuted and displaced posterior right 10th rib fracture. Comminuted and less displaced posterior right 11th rib fracture. Right lateral 10th rib also fractured and nondisplaced series 6, image 139. Partially visible comminuted fracture of the right lateral 9th rib on image 145. Nondisplaced fracture of the right anterior 8th rib on image 139. Additional lower ribs may be visible on CT Abdomen and Pelvis today reported separately. No other acute rib  fracture identified on these images. Review of the MIP images confirms the above findings. IMPRESSION: 1. Negative for acute pulmonary embolus identified, mild respiratory motion. 2. Right side rib fractures 8 through 11. Most are comminuted and mildly displaced. No associated pneumothorax, pleural effusion, or contusion. Bilateral lung base atelectasis. 3. Multilevel thoracic spine compression fractures, T12 level probably chronic and visible on 2022 radiographs. Other levels are age indeterminate. If specific therapy such as vertebroplasty is desired, noncontrast MRI or Nuclear Medicine Whole-body Bone Scan would best determine acuity. 4.  CT Abdomen and Pelvis reported separately. 5. Calcified coronary artery, Aortic Atherosclerosis (ICD10-I70.0). Electronically Signed   By: VEAR Hurst M.D.   On: 12/17/2023 06:59   DG Ribs Unilateral W/Chest Right Result Date: 12/07/2023  CLINICAL DATA:  78 year old male with right posterior rib pain after a fall. EXAM: RIGHT RIBS AND CHEST - 3+ VIEW COMPARISON:  Chest radiographs 04/01/2021 and earlier. FINDINGS: Portable AP upright view at 0829 hours. Lower lung volumes. Calcified aortic atherosclerosis. Other mediastinal contours are within normal limits. Allowing for portable technique the lungs are clear. No pneumothorax or pleural effusion. Negative visible bowel gas. Four oblique views of the right ribs. Appearance suspicious for minimally displaced right anterolateral 10th rib fracture, and on the 4th image cortical irregularity demonstrated there. No other right rib fracture identified. Chronic lumbar compression fractures were visible in 2022. Other visible osseous structures appear intact and aligned. IMPRESSION: 1. Minimally displaced right 10th rib fracture. 2. No acute cardiopulmonary abnormality. No other acute traumatic injury identified. 3. Chronic lumbar compression fractures. Electronically Signed   By: VEAR Hurst M.D.   On: 12/07/2023 09:02     Microbiology: Results for orders placed or performed during the hospital encounter of 12/17/23  Resp panel by RT-PCR (RSV, Flu A&B, Covid) Anterior Nasal Swab     Status: None   Collection Time: 12/17/23  4:58 AM   Specimen: Anterior Nasal Swab  Result Value Ref Range Status   SARS Coronavirus 2 by RT PCR NEGATIVE NEGATIVE Final    Comment: (NOTE) SARS-CoV-2 target nucleic acids are NOT DETECTED.  The SARS-CoV-2 RNA is generally detectable in upper respiratory specimens during the acute phase of infection. The lowest concentration of SARS-CoV-2 viral copies this assay can detect is 138 copies/mL. A negative result does not preclude SARS-Cov-2 infection and should not be used as the sole basis for treatment or other patient management decisions. A negative result may occur with  improper specimen collection/handling, submission of specimen other than nasopharyngeal swab, presence of viral mutation(s) within the areas targeted by this assay, and inadequate number of viral copies(<138 copies/mL). A negative result must be combined with clinical observations, patient history, and epidemiological information. The expected result is Negative.  Fact Sheet for Patients:  BloggerCourse.com  Fact Sheet for Healthcare Providers:  SeriousBroker.it  This test is no t yet approved or cleared by the [place]  FDA and  has been authorized for detection and/or diagnosis of SARS-CoV-2 by FDA under an Emergency Use Authorization (EUA). This EUA will remain  in effect (meaning this test can be used) for the duration of the COVID-19 declaration under Section 564(b)(1) of the Act, 21 U.S.C.section 360bbb-3(b)(1), unless the authorization is terminated  or revoked sooner.       Influenza A by PCR NEGATIVE NEGATIVE Final   Influenza B by PCR NEGATIVE NEGATIVE Final    Comment: (NOTE) The Xpert Xpress SARS-CoV-2/FLU/RSV plus assay is  intended as an aid in the diagnosis of influenza from Nasopharyngeal swab specimens and should not be used as a sole basis for treatment. Nasal washings and aspirates are unacceptable for Xpert Xpress SARS-CoV-2/FLU/RSV testing.  Fact Sheet for Patients: BloggerCourse.com  Fact Sheet for Healthcare Providers: SeriousBroker.it  This test is not yet approved or cleared by the [place]  FDA and has been authorized for detection and/or diagnosis of SARS-CoV-2 by FDA under an Emergency Use Authorization (EUA). This EUA will remain in effect (meaning this test can be used) for the duration of the COVID-19 declaration under Section 564(b)(1) of the Act, 21 U.S.C. section 360bbb-3(b)(1), unless the authorization is terminated or revoked.     Resp Syncytial Virus by PCR NEGATIVE NEGATIVE Final    Comment: (NOTE) Fact Sheet for Patients: BloggerCourse.com  Fact Sheet for Healthcare Providers: SeriousBroker.it  This test is not yet approved or cleared by the [place]  FDA and has been authorized for detection and/or diagnosis of SARS-CoV-2 by FDA under an Emergency Use Authorization (EUA). This EUA will remain in effect (meaning this test can be used) for the duration of the COVID-19 declaration under Section 564(b)(1) of the Act, 21 U.S.C. section 360bbb-3(b)(1), unless the authorization is terminated or revoked.  Performed at [facility], [address] [place], [place] [address]     Labs: CBC: Recent Labs  Lab 12/18/23 0556 12/19/23 0511 12/22/23 0559  WBC 3.0* 4.8 4.0  HGB 12.4* 11.4* 13.0  HCT 35.4* 32.1* 36.7*  MCV 90.8 89.9 88.9  PLT 82* 84* 113*   Basic Metabolic Panel: Recent Labs  Lab 12/20/23 0539 12/21/23 0314 12/22/23 0559 12/23/23 0554 12/24/23 0534  NA 125* 122* 121* 121* 124*  K 4.4 4.4 4.2 4.2 4.4  CL 92* 91* 90* 87* 87*  CO2 24  22 23 22 25   GLUCOSE 148* 135* 142* 127* 167*  BUN 37* 32* 35* 32* 35*  CREATININE 1.37* 1.19 1.36* 1.30* 1.23  CALCIUM  8.7* 8.5* 8.7* 8.4* 9.0  MG  --   --   --   --  1.8  PHOS  --   --   --   --  2.5   Liver Function Tests: Recent Labs  Lab 12/18/23 0556 12/23/23 0554 12/24/23 0534  AST 24 36  --   ALT 27 29  --   ALKPHOS 90 84  --   BILITOT 2.1* 1.7*  --   PROT 6.3* 6.0*  --   ALBUMIN  3.2* 3.0* 3.3*   CBG: Recent Labs  Lab 12/23/23 1138 12/23/23 1556 12/23/23 2004 12/24/23 0805 12/24/23 1110  GLUCAP 251* 242* 173* 154* 212*    Discharge time spent: greater than 30 minutes.  This record has been created using Conservation officer, historic buildings. Errors have been sought and corrected,but may not always be located. Such creation errors do not reflect on the standard of care.   Signed: Amaryllis Dare, MD [facility] 12/24/2023

## 2023-12-24 NOTE — Plan of Care (Signed)

## 2023-12-24 NOTE — Progress Notes (Signed)
 [facility]  ROUNDING NOTE   Subjective:   Patient seen sitting up in chair today, shaving Private nurse aide at bedside Alert and oriented Room air No lower extremity edema  Sodium 124  Objective:  Vital signs in last 24 hours:  Temp:  [97.5 F (36.4 C)-98.2 F (36.8 C)] 97.9 F (36.6 C) (09/18 0826) Pulse Rate:  [67-81] 78 (09/18 0826) Resp:  [18-20] 20 (09/18 0826) BP: (100-139)/(63-74) 100/63 (09/18 0826) SpO2:  [96 %-100 %] 100 % (09/18 0826)  Weight change:  Filed Weights   12/17/23 0425  Weight: 79.2 kg    Intake/Output: I/O last 3 completed shifts: In: 660 [P.O.:660] Out: 1801 [Urine:1801]   Intake/Output this shift:  Total I/O In: 360 [P.O.:360] Out: -   Physical Exam: General: NAD  Head: Normocephalic, atraumatic. Moist oral mucosal membranes  Eyes: Anicteric  Neck: Supple  Lungs:  Clear to auscultation  Heart: Regular rate and rhythm  Abdomen:  Soft, nontender  Extremities:  No peripheral edema.  Neurologic: Awake, alert, conversant  Skin: Warm,dry, no rash       Basic Metabolic Panel: Recent Labs  Lab 12/20/23 0539 12/21/23 0314 12/22/23 0559 12/23/23 0554 12/24/23 0534  NA 125* 122* 121* 121* 124*  K 4.4 4.4 4.2 4.2 4.4  CL 92* 91* 90* 87* 87*  CO2 24 22 23 22 25   GLUCOSE 148* 135* 142* 127* 167*  BUN 37* 32* 35* 32* 35*  CREATININE 1.37* 1.19 1.36* 1.30* 1.23  CALCIUM  8.7* 8.5* 8.7* 8.4* 9.0  MG  --   --   --   --  1.8  PHOS  --   --   --   --  2.5    Liver Function Tests: Recent Labs  Lab 12/18/23 0556 12/23/23 0554 12/24/23 0534  AST 24 36  --   ALT 27 29  --   ALKPHOS 90 84  --   BILITOT 2.1* 1.7*  --   PROT 6.3* 6.0*  --   ALBUMIN  3.2* 3.0* 3.3*   No results for input(s): LIPASE, AMYLASE in the last 168 hours.  No results for input(s): AMMONIA in the last 168 hours.  CBC: Recent Labs  Lab 12/18/23 0556 12/19/23 0511 12/22/23 0559  WBC 3.0* 4.8 4.0  HGB 12.4* 11.4* 13.0  HCT 35.4*  32.1* 36.7*  MCV 90.8 89.9 88.9  PLT 82* 84* 113*    Cardiac Enzymes: No results for input(s): CKTOTAL, CKMB, CKMBINDEX, TROPONINI in the last 168 hours.  BNP: Invalid input(s): POCBNP  CBG: Recent Labs  Lab 12/23/23 1138 12/23/23 1556 12/23/23 2004 12/24/23 0805 12/24/23 1110  GLUCAP 251* 242* 173* 154* 212*    Microbiology: Results for orders placed or performed during the hospital encounter of 12/17/23  Resp panel by RT-PCR (RSV, Flu A&B, Covid) Anterior Nasal Swab     Status: None   Collection Time: 12/17/23  4:58 AM   Specimen: Anterior Nasal Swab  Result Value Ref Range Status   SARS Coronavirus 2 by RT PCR NEGATIVE NEGATIVE Final    Comment: (NOTE) SARS-CoV-2 target nucleic acids are NOT DETECTED.  The SARS-CoV-2 RNA is generally detectable in upper respiratory specimens during the acute phase of infection. The lowest concentration of SARS-CoV-2 viral copies this assay can detect is 138 copies/mL. A negative result does not preclude SARS-Cov-2 infection and should not be used as the sole basis for treatment or other patient management decisions. A negative result may occur with  improper specimen collection/handling, submission of  specimen other than nasopharyngeal swab, presence of viral mutation(s) within the areas targeted by this assay, and inadequate number of viral copies(<138 copies/mL). A negative result must be combined with clinical observations, patient history, and epidemiological information. The expected result is Negative.  Fact Sheet for Patients:  BloggerCourse.com  Fact Sheet for Healthcare Providers:  SeriousBroker.it  This test is no t yet approved or cleared by the [place]  FDA and  has been authorized for detection and/or diagnosis of SARS-CoV-2 by FDA under an Emergency Use Authorization (EUA). This EUA will remain  in effect (meaning this test can be used) for the  duration of the COVID-19 declaration under Section 564(b)(1) of the Act, 21 U.S.C.section 360bbb-3(b)(1), unless the authorization is terminated  or revoked sooner.       Influenza A by PCR NEGATIVE NEGATIVE Final   Influenza B by PCR NEGATIVE NEGATIVE Final    Comment: (NOTE) The Xpert Xpress SARS-CoV-2/FLU/RSV plus assay is intended as an aid in the diagnosis of influenza from Nasopharyngeal swab specimens and should not be used as a sole basis for treatment. Nasal washings and aspirates are unacceptable for Xpert Xpress SARS-CoV-2/FLU/RSV testing.  Fact Sheet for Patients: BloggerCourse.com  Fact Sheet for Healthcare Providers: SeriousBroker.it  This test is not yet approved or cleared by the [place]  FDA and has been authorized for detection and/or diagnosis of SARS-CoV-2 by FDA under an Emergency Use Authorization (EUA). This EUA will remain in effect (meaning this test can be used) for the duration of the COVID-19 declaration under Section 564(b)(1) of the Act, 21 U.S.C. section 360bbb-3(b)(1), unless the authorization is terminated or revoked.     Resp Syncytial Virus by PCR NEGATIVE NEGATIVE Final    Comment: (NOTE) Fact Sheet for Patients: BloggerCourse.com  Fact Sheet for Healthcare Providers: SeriousBroker.it  This test is not yet approved or cleared by the [place]  FDA and has been authorized for detection and/or diagnosis of SARS-CoV-2 by FDA under an Emergency Use Authorization (EUA). This EUA will remain in effect (meaning this test can be used) for the duration of the COVID-19 declaration under Section 564(b)(1) of the Act, 21 U.S.C. section 360bbb-3(b)(1), unless the authorization is terminated or revoked.  Performed at [facility], [address] [place], [place] [address]     Coagulation Studies: No results for input(s):  LABPROT, INR in the last 72 hours.  Urinalysis: No results for input(s): COLORURINE, LABSPEC, PHURINE, GLUCOSEU, HGBUR, BILIRUBINUR, KETONESUR, PROTEINUR, UROBILINOGEN, NITRITE, LEUKOCYTESUR in the last 72 hours.  Invalid input(s): APPERANCEUR    Imaging: MR BRAIN WO CONTRAST Result Date: 12/24/2023 CLINICAL DATA:  77 year old male with recent fall, multiple rib fractures. Stroke follow-up. EXAM: MRI HEAD WITHOUT CONTRAST TECHNIQUE: Multiplanar, multiecho pulse sequences of the brain and surrounding structures were obtained without intravenous contrast. COMPARISON:  Head CT 03/31/2023.  Brain MRI 07/12/2021. FINDINGS: Brain: Overall cerebral volume not significantly changed since 2023. Mild artifact on both axial and coronal diffusion-weighted imaging, unclear etiology. No restricted diffusion or evidence of acute infarction. Chronic but increased small number of lacunar infarcts in the bilateral cerebellum since 2023 (new on the right series 12, image 3). Chronic encephalomalacia right inferior temporal and parahippocampal gyri (series 21, images 15 and 17) new from 2023, and there is right hippocampal involvement also (series 21, image 16). No regional mass effect and mild ex vacuo enlargement of the right temporal horn there (series 13, image 17). 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 Patchy and confluent additional bilateral cerebral white matter T2 and FLAIR hyperintensity stable since that time. No other cortical encephalomalacia is identified. No midline shift, mass effect, evidence of mass lesion, ventriculomegaly, extra-axial collection or acute intracranial hemorrhage. Cervicomedullary junction and pituitary are within normal limits. Vascular: Major intracranial vascular flow voids are stable since 2023. Mild generalized intracranial artery tortuosity. Skull and upper cervical  spine: Degenerative appearing ligamentous hypertrophy about the odontoid has increased since 2023. Normal background bone marrow signal. Sinuses/Orbits: Chronic orbital floor fractures. Otherwise orbits appear stable and negative. Chronic paranasal sinus mucosal thickening, right posterior maxillary alveolar recess retention cyst. Other: Grossly negative visible internal auditory structures. No acute scalp or face soft tissue finding. IMPRESSION: 1. No acute intracranial abnormality. 2. Progressed ischemia since 2023 with new but chronic infarct and encephalomalacia in the right mesial and inferior temporal lobe. 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 Moderate for age chronic white matter disease also most likely small vessel related. Electronically Signed   By: VEAR Hurst M.D.   On: 12/24/2023 07:31     Medications:    magnesium  sulfate bolus IVPB 2 g (12/24/23 1028)    acetaminophen   500 mg Oral BID   aspirin  EC  81 mg Oral Daily   feeding supplement (GLUCERNA SHAKE)  237 mL Oral BID BM   ferrous sulfate   325 mg Oral Q breakfast   furosemide   20 mg Oral Daily   insulin  aspart  0-5 Units Subcutaneous QHS   insulin  aspart  0-6 Units Subcutaneous TID WC   latanoprost   1 drop Both Eyes QHS   levothyroxine   150 mcg Oral Q0600   losartan   25 mg Oral Daily   pantoprazole   40 mg Oral BID   polyethylene glycol  34 g Oral Daily   senna  1 tablet Oral QHS   spironolactone   25 mg Oral Daily   traMADol   50 mg Oral BID   acetaminophen  **OR** acetaminophen , ondansetron  **OR** ondansetron  (ZOFRAN ) IV, mouth rinse, oxyCODONE   Assessment/ Plan:  Mr. Charles Wilkinson is a 78 y.o.  male with medical problems of  Hypertension, diabetes, hyperlipidemia, hypothyroidism, NASH, CKD, history of falls, pancreatic mass, portal hypertension, cirrhosis, right rib fracture, right L1 transverse process fracture, T12 and L1 compression fractures, infrarenal abdominal aneurysm,  aortic atherosclerosis was admitted on 12/17/2023 for Shortness of breath [R06.02] Fall [W19.XXXA] Right sided abdominal pain [R10.9] Closed fracture of multiple ribs of right side with routine healing, subsequent encounter [S22.41XD]    Hyponatremia Last known normal sodium was in November 2024.  Sodium of 135 on October 08, 2023 which may be his baseline. Admission sodium was 130 which has progressively worsened. This is likely multifactorial.  Patient received IV contrast at admission which may have caused AKI and fluid retention leading to worsening of hyponatremia.  Other contributing factor is likely hypothyroidism with TSH of 18.9  Sodium has improved to 124 today.  Agree with rate of self correction. Continue fluid restriction and monitoring.     Acute kidney injury Likely secondary to IV contrast. Creatinine peaked at 1.7 but is now improved. Baseline creatinine of 1.2-1.3 (underlying CKD 3A)  Renal function at baseline.   Lab Results  Component Value Date   CREATININE 1.23 12/24/2023   CREATININE 1.30 (H) 12/23/2023   CREATININE 1.36 (H) 12/22/2023    Intake/Output Summary (Last 24 hours) at 12/24/2023 1120 Last data filed at 12/24/2023 1100 Gross per 24  hour  Intake 780 ml  Output 1800 ml  Net -1020 ml   3.  Diabetes type 2 with CKD Hemoglobin A1c 7.8% from July 2025. Management as per primary team.    LOS: 4 Charles Wilkinson 9/18/202511:20 AM

## 2023-12-24 NOTE — TOC Progression Note (Addendum)
 Transition of Care [facility]) - Progression Note    Patient Details  Name: Charles Wilkinson MRN: [id number] Date of Birth: July 11, 1945  [facility] CM/SW Contact  Dalia GORMAN Fuse, RN Phone Number: 12/24/2023, 12:19 PM  Clinical Narrative:     The patient is medically clear to discharge to [facility] for STR. TOC spoke with the patient's daughter [name]. She is agreeable with the discharge plan. TOC spoke with the [facility] to transport.    Barriers to Discharge: Barriers Resolved               Expected Discharge Plan and Services         Expected Discharge Date: 12/24/23                                     Social Drivers of Health (SDOH) Interventions SDOH Screenings   Food Insecurity: No Food Insecurity (12/17/2023)  Housing: Low Risk  (12/17/2023)  Transportation Needs: No Transportation Needs (12/17/2023)  Utilities: Not At Risk (12/17/2023)  Financial Resource Strain: Low Risk  (12/15/2023)   [facility]  Social Connections: Moderately Isolated (12/17/2023)  Tobacco Use: Medium Risk (12/17/2023)    Readmission Risk Interventions     No data to display

## 2023-12-24 NOTE — TOC Progression Note (Addendum)
 Transition of Care [facility]) - Progression Note    Patient Details  Name: Nafis Farnan MRN: [id number] Date of Birth: 1945-04-20  Transition of Care [facility] CM/SW Contact  Dalia GORMAN Fuse, RN Phone Number: 12/24/2023, 11:00 AM  Clinical Narrative:     NA 124 today. Will dc to [facility] when medically ready for discharge.                    Expected Discharge Plan and Services                                               Social Drivers of Health (SDOH) Interventions SDOH Screenings   Food Insecurity: No Food Insecurity (12/17/2023)  Housing: Low Risk  (12/17/2023)  Transportation Needs: No Transportation Needs (12/17/2023)  Utilities: Not At Risk (12/17/2023)  Financial Resource Strain: Low Risk  (12/15/2023)   Received from [facility]  Social Connections: Moderately Isolated (12/17/2023)  Tobacco Use: Medium Risk (12/17/2023)    Readmission Risk Interventions     No data to display

## 2023-12-25 DIAGNOSIS — S22048A Other fracture of fourth thoracic vertebra, initial encounter for closed fracture: Secondary | ICD-10-CM | POA: Diagnosis not present

## 2023-12-25 DIAGNOSIS — M19012 Primary osteoarthritis, left shoulder: Secondary | ICD-10-CM | POA: Diagnosis not present

## 2023-12-25 DIAGNOSIS — S22081A Stable burst fracture of T11-T12 vertebra, initial encounter for closed fracture: Secondary | ICD-10-CM | POA: Diagnosis not present

## 2023-12-25 DIAGNOSIS — M800AXA Age-related osteoporosis with current pathological fracture, other site, initial encounter for fracture: Secondary | ICD-10-CM | POA: Diagnosis not present

## 2023-12-25 DIAGNOSIS — R930 Abnormal findings on diagnostic imaging of skull and head, not elsewhere classified: Secondary | ICD-10-CM | POA: Diagnosis not present

## 2023-12-25 DIAGNOSIS — S2239XA Fracture of one rib, unspecified side, initial encounter for closed fracture: Secondary | ICD-10-CM | POA: Diagnosis not present

## 2023-12-25 DIAGNOSIS — E785 Hyperlipidemia, unspecified: Secondary | ICD-10-CM | POA: Diagnosis not present

## 2023-12-25 DIAGNOSIS — K7460 Unspecified cirrhosis of liver: Secondary | ICD-10-CM | POA: Diagnosis not present

## 2023-12-25 DIAGNOSIS — I63511 Cerebral infarction due to unspecified occlusion or stenosis of right middle cerebral artery: Secondary | ICD-10-CM | POA: Diagnosis not present

## 2023-12-25 DIAGNOSIS — K7469 Other cirrhosis of liver: Secondary | ICD-10-CM | POA: Diagnosis not present

## 2023-12-25 DIAGNOSIS — N1831 Chronic kidney disease, stage 3a: Secondary | ICD-10-CM | POA: Diagnosis not present

## 2023-12-25 DIAGNOSIS — K7581 Nonalcoholic steatohepatitis (NASH): Secondary | ICD-10-CM | POA: Diagnosis not present

## 2023-12-25 DIAGNOSIS — E871 Hypo-osmolality and hyponatremia: Secondary | ICD-10-CM | POA: Diagnosis not present

## 2023-12-25 DIAGNOSIS — S32011A Stable burst fracture of first lumbar vertebra, initial encounter for closed fracture: Secondary | ICD-10-CM | POA: Diagnosis not present

## 2023-12-25 DIAGNOSIS — S0990XA Unspecified injury of head, initial encounter: Secondary | ICD-10-CM | POA: Diagnosis not present

## 2023-12-25 DIAGNOSIS — M8008XA Age-related osteoporosis with current pathological fracture, vertebra(e), initial encounter for fracture: Secondary | ICD-10-CM | POA: Diagnosis not present

## 2023-12-25 DIAGNOSIS — M11212 Other chondrocalcinosis, left shoulder: Secondary | ICD-10-CM | POA: Diagnosis not present

## 2023-12-25 DIAGNOSIS — S2241XA Multiple fractures of ribs, right side, initial encounter for closed fracture: Secondary | ICD-10-CM | POA: Diagnosis not present

## 2023-12-25 DIAGNOSIS — S22088A Other fracture of T11-T12 vertebra, initial encounter for closed fracture: Secondary | ICD-10-CM | POA: Diagnosis not present

## 2023-12-25 DIAGNOSIS — S32018D Other fracture of first lumbar vertebra, subsequent encounter for fracture with routine healing: Secondary | ICD-10-CM | POA: Diagnosis not present

## 2023-12-25 DIAGNOSIS — G9341 Metabolic encephalopathy: Secondary | ICD-10-CM | POA: Diagnosis not present

## 2023-12-25 DIAGNOSIS — M546 Pain in thoracic spine: Secondary | ICD-10-CM | POA: Diagnosis not present

## 2023-12-25 DIAGNOSIS — E1169 Type 2 diabetes mellitus with other specified complication: Secondary | ICD-10-CM | POA: Diagnosis not present

## 2023-12-25 DIAGNOSIS — S32021A Stable burst fracture of second lumbar vertebra, initial encounter for closed fracture: Secondary | ICD-10-CM | POA: Diagnosis not present

## 2023-12-25 DIAGNOSIS — G20A1 Parkinson's disease without dyskinesia, without mention of fluctuations: Secondary | ICD-10-CM | POA: Diagnosis not present

## 2023-12-25 DIAGNOSIS — N183 Chronic kidney disease, stage 3 unspecified: Secondary | ICD-10-CM | POA: Diagnosis not present

## 2023-12-25 DIAGNOSIS — R54 Age-related physical debility: Secondary | ICD-10-CM | POA: Diagnosis not present

## 2023-12-25 DIAGNOSIS — R296 Repeated falls: Secondary | ICD-10-CM | POA: Diagnosis not present

## 2023-12-25 DIAGNOSIS — S2241XD Multiple fractures of ribs, right side, subsequent encounter for fracture with routine healing: Secondary | ICD-10-CM | POA: Diagnosis not present

## 2023-12-25 DIAGNOSIS — E1122 Type 2 diabetes mellitus with diabetic chronic kidney disease: Secondary | ICD-10-CM | POA: Diagnosis not present

## 2023-12-25 DIAGNOSIS — M810 Age-related osteoporosis without current pathological fracture: Secondary | ICD-10-CM | POA: Diagnosis not present

## 2023-12-25 DIAGNOSIS — S2231XA Fracture of one rib, right side, initial encounter for closed fracture: Secondary | ICD-10-CM | POA: Diagnosis not present

## 2023-12-25 DIAGNOSIS — S199XXA Unspecified injury of neck, initial encounter: Secondary | ICD-10-CM | POA: Diagnosis not present

## 2023-12-25 DIAGNOSIS — I7143 Infrarenal abdominal aortic aneurysm, without rupture: Secondary | ICD-10-CM | POA: Diagnosis not present

## 2023-12-25 DIAGNOSIS — I129 Hypertensive chronic kidney disease with stage 1 through stage 4 chronic kidney disease, or unspecified chronic kidney disease: Secondary | ICD-10-CM | POA: Diagnosis not present

## 2023-12-25 DIAGNOSIS — S22009A Unspecified fracture of unspecified thoracic vertebra, initial encounter for closed fracture: Secondary | ICD-10-CM | POA: Diagnosis not present

## 2023-12-25 DIAGNOSIS — D61818 Other pancytopenia: Secondary | ICD-10-CM | POA: Diagnosis not present

## 2023-12-25 DIAGNOSIS — F05 Delirium due to known physiological condition: Secondary | ICD-10-CM | POA: Diagnosis not present

## 2023-12-25 DIAGNOSIS — K219 Gastro-esophageal reflux disease without esophagitis: Secondary | ICD-10-CM | POA: Diagnosis not present

## 2023-12-25 DIAGNOSIS — Z7982 Long term (current) use of aspirin: Secondary | ICD-10-CM | POA: Diagnosis not present

## 2023-12-25 DIAGNOSIS — M11211 Other chondrocalcinosis, right shoulder: Secondary | ICD-10-CM | POA: Diagnosis not present

## 2023-12-25 DIAGNOSIS — M19011 Primary osteoarthritis, right shoulder: Secondary | ICD-10-CM | POA: Diagnosis not present

## 2023-12-25 DIAGNOSIS — D696 Thrombocytopenia, unspecified: Secondary | ICD-10-CM | POA: Diagnosis not present

## 2023-12-25 DIAGNOSIS — R29890 Loss of height: Secondary | ICD-10-CM | POA: Diagnosis not present

## 2023-12-25 DIAGNOSIS — S32010A Wedge compression fracture of first lumbar vertebra, initial encounter for closed fracture: Secondary | ICD-10-CM | POA: Diagnosis not present

## 2023-12-25 DIAGNOSIS — Z043 Encounter for examination and observation following other accident: Secondary | ICD-10-CM | POA: Diagnosis not present

## 2023-12-25 DIAGNOSIS — K5900 Constipation, unspecified: Secondary | ICD-10-CM | POA: Diagnosis not present

## 2023-12-25 DIAGNOSIS — R4182 Altered mental status, unspecified: Secondary | ICD-10-CM | POA: Diagnosis not present

## 2023-12-25 DIAGNOSIS — E039 Hypothyroidism, unspecified: Secondary | ICD-10-CM | POA: Diagnosis not present

## 2024-01-04 DIAGNOSIS — S2241XD Multiple fractures of ribs, right side, subsequent encounter for fracture with routine healing: Secondary | ICD-10-CM | POA: Diagnosis not present

## 2024-01-04 DIAGNOSIS — N1831 Chronic kidney disease, stage 3a: Secondary | ICD-10-CM | POA: Diagnosis not present

## 2024-01-04 DIAGNOSIS — R52 Pain, unspecified: Secondary | ICD-10-CM | POA: Diagnosis not present

## 2024-01-04 DIAGNOSIS — S31010A Laceration without foreign body of lower back and pelvis without penetration into retroperitoneum, initial encounter: Secondary | ICD-10-CM | POA: Diagnosis not present

## 2024-01-04 DIAGNOSIS — M6281 Muscle weakness (generalized): Secondary | ICD-10-CM | POA: Diagnosis not present

## 2024-01-04 DIAGNOSIS — S31100A Unspecified open wound of abdominal wall, right upper quadrant without penetration into peritoneal cavity, initial encounter: Secondary | ICD-10-CM | POA: Diagnosis not present

## 2024-01-04 DIAGNOSIS — S32029A Unspecified fracture of second lumbar vertebra, initial encounter for closed fracture: Secondary | ICD-10-CM | POA: Diagnosis not present

## 2024-01-04 DIAGNOSIS — S32011D Stable burst fracture of first lumbar vertebra, subsequent encounter for fracture with routine healing: Secondary | ICD-10-CM | POA: Diagnosis not present

## 2024-01-04 DIAGNOSIS — E119 Type 2 diabetes mellitus without complications: Secondary | ICD-10-CM | POA: Diagnosis not present

## 2024-01-04 DIAGNOSIS — K861 Other chronic pancreatitis: Secondary | ICD-10-CM | POA: Diagnosis not present

## 2024-01-04 DIAGNOSIS — S32021A Stable burst fracture of second lumbar vertebra, initial encounter for closed fracture: Secondary | ICD-10-CM | POA: Diagnosis not present

## 2024-01-04 DIAGNOSIS — I6523 Occlusion and stenosis of bilateral carotid arteries: Secondary | ICD-10-CM | POA: Diagnosis not present

## 2024-01-04 DIAGNOSIS — L89153 Pressure ulcer of sacral region, stage 3: Secondary | ICD-10-CM | POA: Diagnosis not present

## 2024-01-04 DIAGNOSIS — I10 Essential (primary) hypertension: Secondary | ICD-10-CM | POA: Diagnosis not present

## 2024-01-04 DIAGNOSIS — R531 Weakness: Secondary | ICD-10-CM | POA: Diagnosis not present

## 2024-01-04 DIAGNOSIS — F05 Delirium due to known physiological condition: Secondary | ICD-10-CM | POA: Diagnosis not present

## 2024-01-04 DIAGNOSIS — R4182 Altered mental status, unspecified: Secondary | ICD-10-CM | POA: Diagnosis not present

## 2024-01-04 DIAGNOSIS — R279 Unspecified lack of coordination: Secondary | ICD-10-CM | POA: Diagnosis not present

## 2024-01-04 DIAGNOSIS — Z043 Encounter for examination and observation following other accident: Secondary | ICD-10-CM | POA: Diagnosis not present

## 2024-01-04 DIAGNOSIS — M810 Age-related osteoporosis without current pathological fracture: Secondary | ICD-10-CM | POA: Diagnosis not present

## 2024-01-04 DIAGNOSIS — E1129 Type 2 diabetes mellitus with other diabetic kidney complication: Secondary | ICD-10-CM | POA: Diagnosis not present

## 2024-01-04 DIAGNOSIS — I129 Hypertensive chronic kidney disease with stage 1 through stage 4 chronic kidney disease, or unspecified chronic kidney disease: Secondary | ICD-10-CM | POA: Diagnosis not present

## 2024-01-04 DIAGNOSIS — I77819 Aortic ectasia, unspecified site: Secondary | ICD-10-CM | POA: Diagnosis not present

## 2024-01-04 DIAGNOSIS — R6889 Other general symptoms and signs: Secondary | ICD-10-CM | POA: Diagnosis not present

## 2024-01-04 DIAGNOSIS — S32018D Other fracture of first lumbar vertebra, subsequent encounter for fracture with routine healing: Secondary | ICD-10-CM | POA: Diagnosis not present

## 2024-01-04 DIAGNOSIS — I639 Cerebral infarction, unspecified: Secondary | ICD-10-CM | POA: Diagnosis not present

## 2024-01-04 DIAGNOSIS — Z7409 Other reduced mobility: Secondary | ICD-10-CM | POA: Diagnosis not present

## 2024-01-04 DIAGNOSIS — D696 Thrombocytopenia, unspecified: Secondary | ICD-10-CM | POA: Diagnosis not present

## 2024-01-04 DIAGNOSIS — S32000A Wedge compression fracture of unspecified lumbar vertebra, initial encounter for closed fracture: Secondary | ICD-10-CM | POA: Diagnosis not present

## 2024-01-04 DIAGNOSIS — R54 Age-related physical debility: Secondary | ICD-10-CM | POA: Diagnosis not present

## 2024-01-04 DIAGNOSIS — R5381 Other malaise: Secondary | ICD-10-CM | POA: Diagnosis not present

## 2024-01-04 DIAGNOSIS — E860 Dehydration: Secondary | ICD-10-CM | POA: Diagnosis not present

## 2024-01-04 DIAGNOSIS — S31000A Unspecified open wound of lower back and pelvis without penetration into retroperitoneum, initial encounter: Secondary | ICD-10-CM | POA: Diagnosis not present

## 2024-01-04 DIAGNOSIS — S2241XA Multiple fractures of ribs, right side, initial encounter for closed fracture: Secondary | ICD-10-CM | POA: Diagnosis not present

## 2024-01-04 DIAGNOSIS — E871 Hypo-osmolality and hyponatremia: Secondary | ICD-10-CM | POA: Diagnosis not present

## 2024-01-04 DIAGNOSIS — R4701 Aphasia: Secondary | ICD-10-CM | POA: Diagnosis not present

## 2024-01-04 DIAGNOSIS — K7581 Nonalcoholic steatohepatitis (NASH): Secondary | ICD-10-CM | POA: Diagnosis not present

## 2024-01-04 DIAGNOSIS — S22009A Unspecified fracture of unspecified thoracic vertebra, initial encounter for closed fracture: Secondary | ICD-10-CM | POA: Diagnosis not present

## 2024-01-04 DIAGNOSIS — E46 Unspecified protein-calorie malnutrition: Secondary | ICD-10-CM | POA: Diagnosis not present

## 2024-01-04 DIAGNOSIS — S2239XD Fracture of one rib, unspecified side, subsequent encounter for fracture with routine healing: Secondary | ICD-10-CM | POA: Diagnosis not present

## 2024-01-04 DIAGNOSIS — G20A1 Parkinson's disease without dyskinesia, without mention of fluctuations: Secondary | ICD-10-CM | POA: Diagnosis not present

## 2024-01-04 DIAGNOSIS — S22000A Wedge compression fracture of unspecified thoracic vertebra, initial encounter for closed fracture: Secondary | ICD-10-CM | POA: Diagnosis not present

## 2024-01-04 DIAGNOSIS — E1122 Type 2 diabetes mellitus with diabetic chronic kidney disease: Secondary | ICD-10-CM | POA: Diagnosis not present

## 2024-01-04 DIAGNOSIS — S32021D Stable burst fracture of second lumbar vertebra, subsequent encounter for fracture with routine healing: Secondary | ICD-10-CM | POA: Diagnosis not present

## 2024-01-04 DIAGNOSIS — K7469 Other cirrhosis of liver: Secondary | ICD-10-CM | POA: Diagnosis not present

## 2024-01-04 DIAGNOSIS — E039 Hypothyroidism, unspecified: Secondary | ICD-10-CM | POA: Diagnosis not present

## 2024-01-05 DIAGNOSIS — E1129 Type 2 diabetes mellitus with other diabetic kidney complication: Secondary | ICD-10-CM | POA: Diagnosis not present

## 2024-01-05 DIAGNOSIS — K7581 Nonalcoholic steatohepatitis (NASH): Secondary | ICD-10-CM | POA: Diagnosis not present

## 2024-01-05 DIAGNOSIS — R52 Pain, unspecified: Secondary | ICD-10-CM | POA: Diagnosis not present

## 2024-01-05 DIAGNOSIS — R531 Weakness: Secondary | ICD-10-CM | POA: Diagnosis not present

## 2024-01-06 DIAGNOSIS — K7581 Nonalcoholic steatohepatitis (NASH): Secondary | ICD-10-CM | POA: Diagnosis not present

## 2024-01-06 DIAGNOSIS — R52 Pain, unspecified: Secondary | ICD-10-CM | POA: Diagnosis not present

## 2024-01-06 DIAGNOSIS — R6889 Other general symptoms and signs: Secondary | ICD-10-CM | POA: Diagnosis not present

## 2024-01-06 DIAGNOSIS — R531 Weakness: Secondary | ICD-10-CM | POA: Diagnosis not present

## 2024-01-06 DIAGNOSIS — S32000A Wedge compression fracture of unspecified lumbar vertebra, initial encounter for closed fracture: Secondary | ICD-10-CM | POA: Diagnosis not present

## 2024-01-06 DIAGNOSIS — Z7409 Other reduced mobility: Secondary | ICD-10-CM | POA: Diagnosis not present

## 2024-01-08 DIAGNOSIS — S2241XA Multiple fractures of ribs, right side, initial encounter for closed fracture: Secondary | ICD-10-CM | POA: Diagnosis not present

## 2024-01-08 DIAGNOSIS — R531 Weakness: Secondary | ICD-10-CM | POA: Diagnosis not present

## 2024-01-08 DIAGNOSIS — K7581 Nonalcoholic steatohepatitis (NASH): Secondary | ICD-10-CM | POA: Diagnosis not present

## 2024-01-08 DIAGNOSIS — Z7409 Other reduced mobility: Secondary | ICD-10-CM | POA: Diagnosis not present

## 2024-01-08 DIAGNOSIS — R52 Pain, unspecified: Secondary | ICD-10-CM | POA: Diagnosis not present

## 2024-01-08 DIAGNOSIS — S32000A Wedge compression fracture of unspecified lumbar vertebra, initial encounter for closed fracture: Secondary | ICD-10-CM | POA: Diagnosis not present

## 2024-01-11 DIAGNOSIS — E039 Hypothyroidism, unspecified: Secondary | ICD-10-CM | POA: Diagnosis not present

## 2024-01-11 DIAGNOSIS — Z7409 Other reduced mobility: Secondary | ICD-10-CM | POA: Diagnosis not present

## 2024-01-11 DIAGNOSIS — R531 Weakness: Secondary | ICD-10-CM | POA: Diagnosis not present

## 2024-01-11 DIAGNOSIS — R52 Pain, unspecified: Secondary | ICD-10-CM | POA: Diagnosis not present

## 2024-01-11 DIAGNOSIS — E871 Hypo-osmolality and hyponatremia: Secondary | ICD-10-CM | POA: Diagnosis not present

## 2024-01-13 DIAGNOSIS — E119 Type 2 diabetes mellitus without complications: Secondary | ICD-10-CM | POA: Diagnosis not present

## 2024-01-13 DIAGNOSIS — S2239XD Fracture of one rib, unspecified side, subsequent encounter for fracture with routine healing: Secondary | ICD-10-CM | POA: Diagnosis not present

## 2024-01-13 DIAGNOSIS — I639 Cerebral infarction, unspecified: Secondary | ICD-10-CM | POA: Diagnosis not present

## 2024-01-13 DIAGNOSIS — D696 Thrombocytopenia, unspecified: Secondary | ICD-10-CM | POA: Diagnosis not present

## 2024-01-14 DIAGNOSIS — E46 Unspecified protein-calorie malnutrition: Secondary | ICD-10-CM | POA: Diagnosis not present

## 2024-01-14 DIAGNOSIS — S22000A Wedge compression fracture of unspecified thoracic vertebra, initial encounter for closed fracture: Secondary | ICD-10-CM | POA: Diagnosis not present

## 2024-01-14 DIAGNOSIS — S32000A Wedge compression fracture of unspecified lumbar vertebra, initial encounter for closed fracture: Secondary | ICD-10-CM | POA: Diagnosis not present

## 2024-01-14 DIAGNOSIS — S2241XA Multiple fractures of ribs, right side, initial encounter for closed fracture: Secondary | ICD-10-CM | POA: Diagnosis not present

## 2024-01-15 DIAGNOSIS — R52 Pain, unspecified: Secondary | ICD-10-CM | POA: Diagnosis not present

## 2024-01-15 DIAGNOSIS — Z7409 Other reduced mobility: Secondary | ICD-10-CM | POA: Diagnosis not present

## 2024-01-15 DIAGNOSIS — N1831 Chronic kidney disease, stage 3a: Secondary | ICD-10-CM | POA: Diagnosis not present

## 2024-01-15 DIAGNOSIS — S31010A Laceration without foreign body of lower back and pelvis without penetration into retroperitoneum, initial encounter: Secondary | ICD-10-CM | POA: Diagnosis not present

## 2024-01-15 DIAGNOSIS — R6889 Other general symptoms and signs: Secondary | ICD-10-CM | POA: Diagnosis not present

## 2024-01-15 DIAGNOSIS — S31100A Unspecified open wound of abdominal wall, right upper quadrant without penetration into peritoneal cavity, initial encounter: Secondary | ICD-10-CM | POA: Diagnosis not present

## 2024-01-15 DIAGNOSIS — R531 Weakness: Secondary | ICD-10-CM | POA: Diagnosis not present

## 2024-01-15 DIAGNOSIS — E039 Hypothyroidism, unspecified: Secondary | ICD-10-CM | POA: Diagnosis not present

## 2024-01-18 DIAGNOSIS — R52 Pain, unspecified: Secondary | ICD-10-CM | POA: Diagnosis not present

## 2024-01-18 DIAGNOSIS — Z7409 Other reduced mobility: Secondary | ICD-10-CM | POA: Diagnosis not present

## 2024-01-18 DIAGNOSIS — R531 Weakness: Secondary | ICD-10-CM | POA: Diagnosis not present

## 2024-01-18 DIAGNOSIS — S31000A Unspecified open wound of lower back and pelvis without penetration into retroperitoneum, initial encounter: Secondary | ICD-10-CM | POA: Diagnosis not present

## 2024-01-18 DIAGNOSIS — E46 Unspecified protein-calorie malnutrition: Secondary | ICD-10-CM | POA: Diagnosis not present

## 2024-01-18 DIAGNOSIS — R6889 Other general symptoms and signs: Secondary | ICD-10-CM | POA: Diagnosis not present

## 2024-01-19 DIAGNOSIS — S31010A Laceration without foreign body of lower back and pelvis without penetration into retroperitoneum, initial encounter: Secondary | ICD-10-CM | POA: Diagnosis not present

## 2024-01-19 DIAGNOSIS — S32000A Wedge compression fracture of unspecified lumbar vertebra, initial encounter for closed fracture: Secondary | ICD-10-CM | POA: Diagnosis not present

## 2024-01-19 DIAGNOSIS — S22000A Wedge compression fracture of unspecified thoracic vertebra, initial encounter for closed fracture: Secondary | ICD-10-CM | POA: Diagnosis not present

## 2024-01-19 DIAGNOSIS — E46 Unspecified protein-calorie malnutrition: Secondary | ICD-10-CM | POA: Diagnosis not present

## 2024-01-20 DIAGNOSIS — R531 Weakness: Secondary | ICD-10-CM | POA: Diagnosis not present

## 2024-01-20 DIAGNOSIS — R52 Pain, unspecified: Secondary | ICD-10-CM | POA: Diagnosis not present

## 2024-01-20 DIAGNOSIS — S31010A Laceration without foreign body of lower back and pelvis without penetration into retroperitoneum, initial encounter: Secondary | ICD-10-CM | POA: Diagnosis not present

## 2024-01-20 DIAGNOSIS — Z7409 Other reduced mobility: Secondary | ICD-10-CM | POA: Diagnosis not present

## 2024-01-20 DIAGNOSIS — R6889 Other general symptoms and signs: Secondary | ICD-10-CM | POA: Diagnosis not present

## 2024-01-21 DIAGNOSIS — S32029A Unspecified fracture of second lumbar vertebra, initial encounter for closed fracture: Secondary | ICD-10-CM | POA: Diagnosis not present

## 2024-01-21 DIAGNOSIS — E860 Dehydration: Secondary | ICD-10-CM | POA: Diagnosis not present

## 2024-01-21 DIAGNOSIS — S32011D Stable burst fracture of first lumbar vertebra, subsequent encounter for fracture with routine healing: Secondary | ICD-10-CM | POA: Diagnosis not present

## 2024-01-21 DIAGNOSIS — G20A1 Parkinson's disease without dyskinesia, without mention of fluctuations: Secondary | ICD-10-CM | POA: Diagnosis not present

## 2024-01-21 DIAGNOSIS — R410 Disorientation, unspecified: Secondary | ICD-10-CM | POA: Diagnosis not present

## 2024-01-21 DIAGNOSIS — R4701 Aphasia: Secondary | ICD-10-CM | POA: Diagnosis not present

## 2024-01-21 DIAGNOSIS — I77819 Aortic ectasia, unspecified site: Secondary | ICD-10-CM | POA: Diagnosis not present

## 2024-01-21 DIAGNOSIS — S2249XD Multiple fractures of ribs, unspecified side, subsequent encounter for fracture with routine healing: Secondary | ICD-10-CM | POA: Diagnosis not present

## 2024-01-21 DIAGNOSIS — D649 Anemia, unspecified: Secondary | ICD-10-CM | POA: Diagnosis not present

## 2024-01-21 DIAGNOSIS — S32021A Stable burst fracture of second lumbar vertebra, initial encounter for closed fracture: Secondary | ICD-10-CM | POA: Diagnosis not present

## 2024-01-21 DIAGNOSIS — E785 Hyperlipidemia, unspecified: Secondary | ICD-10-CM | POA: Diagnosis not present

## 2024-01-21 DIAGNOSIS — S32021D Stable burst fracture of second lumbar vertebra, subsequent encounter for fracture with routine healing: Secondary | ICD-10-CM | POA: Diagnosis not present

## 2024-01-21 DIAGNOSIS — R4182 Altered mental status, unspecified: Secondary | ICD-10-CM | POA: Diagnosis not present

## 2024-01-21 DIAGNOSIS — E1122 Type 2 diabetes mellitus with diabetic chronic kidney disease: Secondary | ICD-10-CM | POA: Diagnosis not present

## 2024-01-21 DIAGNOSIS — K861 Other chronic pancreatitis: Secondary | ICD-10-CM | POA: Diagnosis not present

## 2024-01-21 DIAGNOSIS — S2241XA Multiple fractures of ribs, right side, initial encounter for closed fracture: Secondary | ICD-10-CM | POA: Diagnosis not present

## 2024-01-21 DIAGNOSIS — Z043 Encounter for examination and observation following other accident: Secondary | ICD-10-CM | POA: Diagnosis not present

## 2024-01-21 DIAGNOSIS — K7469 Other cirrhosis of liver: Secondary | ICD-10-CM | POA: Diagnosis not present

## 2024-01-21 DIAGNOSIS — L89153 Pressure ulcer of sacral region, stage 3: Secondary | ICD-10-CM | POA: Diagnosis not present

## 2024-01-21 DIAGNOSIS — N1831 Chronic kidney disease, stage 3a: Secondary | ICD-10-CM | POA: Diagnosis not present

## 2024-01-21 DIAGNOSIS — D696 Thrombocytopenia, unspecified: Secondary | ICD-10-CM | POA: Diagnosis not present

## 2024-01-21 DIAGNOSIS — F05 Delirium due to known physiological condition: Secondary | ICD-10-CM | POA: Diagnosis not present

## 2024-01-21 DIAGNOSIS — E1169 Type 2 diabetes mellitus with other specified complication: Secondary | ICD-10-CM | POA: Diagnosis not present

## 2024-01-21 DIAGNOSIS — I129 Hypertensive chronic kidney disease with stage 1 through stage 4 chronic kidney disease, or unspecified chronic kidney disease: Secondary | ICD-10-CM | POA: Diagnosis not present

## 2024-01-21 DIAGNOSIS — I6523 Occlusion and stenosis of bilateral carotid arteries: Secondary | ICD-10-CM | POA: Diagnosis not present

## 2024-02-10 DIAGNOSIS — G20C Parkinsonism, unspecified: Secondary | ICD-10-CM | POA: Diagnosis not present

## 2024-02-10 DIAGNOSIS — E785 Hyperlipidemia, unspecified: Secondary | ICD-10-CM | POA: Diagnosis not present

## 2024-02-10 DIAGNOSIS — I10 Essential (primary) hypertension: Secondary | ICD-10-CM | POA: Diagnosis not present

## 2024-02-10 DIAGNOSIS — E119 Type 2 diabetes mellitus without complications: Secondary | ICD-10-CM | POA: Diagnosis not present

## 2024-02-10 DIAGNOSIS — D649 Anemia, unspecified: Secondary | ICD-10-CM | POA: Diagnosis not present

## 2024-02-10 DIAGNOSIS — K219 Gastro-esophageal reflux disease without esophagitis: Secondary | ICD-10-CM | POA: Diagnosis not present

## 2024-02-10 DIAGNOSIS — N1831 Chronic kidney disease, stage 3a: Secondary | ICD-10-CM | POA: Diagnosis not present

## 2024-02-23 DIAGNOSIS — M79674 Pain in right toe(s): Secondary | ICD-10-CM | POA: Diagnosis not present

## 2024-02-23 DIAGNOSIS — M79675 Pain in left toe(s): Secondary | ICD-10-CM | POA: Diagnosis not present

## 2024-02-23 DIAGNOSIS — B351 Tinea unguium: Secondary | ICD-10-CM | POA: Diagnosis not present
